# Patient Record
Sex: Female | Born: 1945
Health system: Midwestern US, Community
[De-identification: ages and names within clinical notes are randomized; demographics above are authoritative.]

## PROBLEM LIST (undated history)

## (undated) DIAGNOSIS — R112 Nausea with vomiting, unspecified: Secondary | ICD-10-CM

## (undated) DIAGNOSIS — I429 Cardiomyopathy, unspecified: Secondary | ICD-10-CM

## (undated) DIAGNOSIS — J189 Pneumonia, unspecified organism: Secondary | ICD-10-CM

## (undated) DIAGNOSIS — H3530 Unspecified macular degeneration: Secondary | ICD-10-CM

## (undated) DIAGNOSIS — C801 Malignant (primary) neoplasm, unspecified: Secondary | ICD-10-CM

## (undated) DIAGNOSIS — Z95810 Presence of automatic (implantable) cardiac defibrillator: Secondary | ICD-10-CM

## (undated) DIAGNOSIS — T8859XA Other complications of anesthesia, initial encounter: Secondary | ICD-10-CM

## (undated) DIAGNOSIS — I509 Heart failure, unspecified: Secondary | ICD-10-CM

## (undated) DIAGNOSIS — Z9889 Other specified postprocedural states: Secondary | ICD-10-CM

## (undated) DIAGNOSIS — T4145XA Adverse effect of unspecified anesthetic, initial encounter: Secondary | ICD-10-CM

## (undated) HISTORY — PX: TONSILLECTOMY: SUR1361

---
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
## 2015-08-26 HISTORY — PX: JOINT REPLACEMENT: SHX530

## 2018-06-11 ENCOUNTER — Ambulatory Visit
Admission: RE | Admit: 2018-06-11 | Discharge: 2018-06-11 | Disposition: A | Discharge status: Home / Self Care | Payer: Medicare PPO | Source: Ambulatory Visit | Attending: Orthopedic Surgery | Admitting: Orthopedic Surgery

## 2018-06-11 ENCOUNTER — Other Ambulatory Visit: Payer: Self-pay

## 2018-06-11 ENCOUNTER — Other Ambulatory Visit: Payer: Self-pay | Admitting: Orthopedic Surgery

## 2018-06-11 DIAGNOSIS — M879 Osteonecrosis, unspecified: Secondary | ICD-10-CM

## 2018-07-31 NOTE — Progress Notes (Signed)
Please place orders in Epic as patient is being scheduled for a pre-op appointment! Thank you! 

## 2018-08-02 NOTE — Progress Notes (Signed)
SPOKE W/PT     SCREENING SYMPTOMS OF COVID 19:   COUGH--NO  RUNNY NOSE--- NO  SORE THROAT---NO  NASAL CONGESTION----NO  SNEEZING----NO  SHORTNESS OF BREATH---NO  DIFFICULTY BREATHING---NO  TEMP >100.0 -----NO  UNEXPLAINED BODY ACHES------NO  CHILLS --------NO  HEADACHES ---------NO  LOSS OF SMELL/ TASTE --------NO    HAVE YOU OR ANY FAMILY MEMBER TRAVELLED PAST 14 DAYS OUT OF THE   COUNTY---NO STATE----NO COUNTRY----NO  HAVE YOU OR ANY FAMILY MEMBER BEEN EXPOSED TO ANYONE WITH COVID 19? NO

## 2018-08-02 NOTE — Patient Instructions (Signed)
Lisa Villanueva  08/02/2018   Your procedure is scheduled on: 08-08-18    Report to [facility] Main  Entrance    Report to Short Stay at 5:30 AM    Call this number if you have problems the morning of surgery [phone]    Remember: Do not eat food or drink liquids :After Midnight.    BRUSH YOUR TEETH MORNING OF SURGERY AND RINSE YOUR MOUTH OUT, NO CHEWING GUM CANDY OR MINTS.     Take these medicines the morning of surgery with A SIP OF WATER: Carvedilol (Coreg)                                  You may not have any metal on your body including hair pins and              piercings  Do not wear jewelry, make-up, lotions, powders or perfumes, deodorant             Do not wear nail polish.  Do not shave  48 hours prior to surgery.              Do not bring valuables to the hospital. [place].  Contacts, dentures or bridgework may not be worn into surgery.   Special Instructions: N/A              Please read over the following fact sheets you were given: _____________________________________________________________________             [facility] - Preparing for Surgery Before surgery, you can play an important role.  Because skin is not sterile, your skin needs to be as free of germs as possible.  You can reduce the number of germs on your skin by washing with CHG (chlorahexidine gluconate) soap before surgery.  CHG is an antiseptic cleaner which kills germs and bonds with the skin to continue killing germs even after washing. Please DO NOT use if you have an allergy to CHG or antibacterial soaps.  If your skin becomes reddened/irritated stop using the CHG and inform your nurse when you arrive at Short Stay. Do not shave (including legs and underarms) for at least 48 hours prior to the first CHG shower.  You may shave your face/neck. Please follow these instructions carefully:  1.  Shower with CHG  Soap the night before surgery and the  morning of Surgery.  2.  If you choose to wash your hair, wash your hair first as usual with your  normal  shampoo.  3.  After you shampoo, rinse your hair and body thoroughly to remove the  shampoo.                           4.  Use CHG as you would any other liquid soap.  You can apply chg directly  to the skin and wash                       Gently with a scrungie or clean washcloth.  5.  Apply the CHG Soap to your body ONLY FROM THE NECK DOWN.   Do not use on face/ open  Wound or open sores. Avoid contact with eyes, ears mouth and genitals (private parts).                       Wash face,  Genitals (private parts) with your normal soap.             6.  Wash thoroughly, paying special attention to the area where your surgery  will be performed.  7.  Thoroughly rinse your body with warm water from the neck down.  8.  DO NOT shower/wash with your normal soap after using and rinsing off  the CHG Soap.                9.  Pat yourself dry with a clean towel.            10.  Wear clean pajamas.            11.  Place clean sheets on your bed the night of your first shower and do not  sleep with pets. Day of Surgery : Do not apply any lotions/deodorants the morning of surgery.  Please wear clean clothes to the hospital/surgery center.  FAILURE TO FOLLOW THESE INSTRUCTIONS MAY RESULT IN THE CANCELLATION OF YOUR SURGERY PATIENT SIGNATURE_________________________________  NURSE SIGNATURE__________________________________  ________________________________________________________________________

## 2018-08-05 ENCOUNTER — Encounter (HOSPITAL_COMMUNITY): Payer: Self-pay

## 2018-08-05 ENCOUNTER — Encounter (HOSPITAL_COMMUNITY)
Admission: RE | Admit: 2018-08-05 | Discharge: 2018-08-05 | Disposition: A | Discharge status: Home / Self Care | Payer: Medicare PPO | Source: Ambulatory Visit | Attending: Orthopedic Surgery | Admitting: Orthopedic Surgery

## 2018-08-05 ENCOUNTER — Other Ambulatory Visit: Payer: Self-pay

## 2018-08-05 ENCOUNTER — Other Ambulatory Visit (HOSPITAL_COMMUNITY)
Admission: RE | Admit: 2018-08-05 | Discharge: 2018-08-05 | Disposition: A | Discharge status: Home / Self Care | Payer: Medicare PPO | Source: Ambulatory Visit | Attending: Orthopedic Surgery | Admitting: Orthopedic Surgery

## 2018-08-05 DIAGNOSIS — Z1159 Encounter for screening for other viral diseases: Secondary | ICD-10-CM | POA: Insufficient documentation

## 2018-08-05 DIAGNOSIS — Z01812 Encounter for preprocedural laboratory examination: Secondary | ICD-10-CM | POA: Insufficient documentation

## 2018-08-05 HISTORY — DX: Pneumonia, unspecified organism: J18.9

## 2018-08-05 HISTORY — DX: Cardiomyopathy, unspecified: I42.9

## 2018-08-05 HISTORY — DX: Malignant (primary) neoplasm, unspecified: C80.1

## 2018-08-05 HISTORY — DX: Presence of automatic (implantable) cardiac defibrillator: Z95.810

## 2018-08-05 HISTORY — DX: Other complications of anesthesia, initial encounter: T88.59XA

## 2018-08-05 HISTORY — DX: Other specified postprocedural states: R11.2

## 2018-08-05 HISTORY — DX: Unspecified macular degeneration: H35.30

## 2018-08-05 HISTORY — DX: Other specified postprocedural states: Z98.890

## 2018-08-05 HISTORY — DX: Heart failure, unspecified: I50.9

## 2018-08-05 LAB — CBC
HCT: 43 % (ref 36.0–46.0)
Hemoglobin: 13.4 g/dL (ref 12.0–15.0)
MCH: 29.5 pg (ref 26.0–34.0)
MCHC: 31.2 g/dL (ref 30.0–36.0)
MCV: 94.5 fL (ref 80.0–100.0)
Platelets: 358 10*3/uL (ref 150–400)
RBC: 4.55 MIL/uL (ref 3.87–5.11)
RDW: 15.1 % (ref 11.5–15.5)
WBC: 10.1 10*3/uL (ref 4.0–10.5)
nRBC: 0 % (ref 0.0–0.2)

## 2018-08-05 LAB — BASIC METABOLIC PANEL
Anion gap: 11 (ref 5–15)
BUN: 18 mg/dL (ref 8–23)
CO2: 27 mmol/L (ref 22–32)
Calcium: 10.2 mg/dL (ref 8.9–10.3)
Chloride: 105 mmol/L (ref 98–111)
Creatinine, Ser: 1.14 mg/dL — ABNORMAL HIGH (ref 0.44–1.00)
GFR calc Af Amer: 56 mL/min — ABNORMAL LOW (ref >60)
GFR calc non Af Amer: 49 mL/min — ABNORMAL LOW (ref >60)
Glucose, Bld: 113 mg/dL — ABNORMAL HIGH (ref 70–99)
Potassium: 4.9 mmol/L (ref 3.5–5.1)
Sodium: 143 mmol/L (ref 135–145)

## 2018-08-05 LAB — SURGICAL PCR SCREEN
MRSA, PCR: NEGATIVE
Staphylococcus aureus: NEGATIVE

## 2018-08-06 ENCOUNTER — Ambulatory Visit: Payer: Self-pay | Admitting: Orthopedic Surgery

## 2018-08-06 LAB — NOVEL CORONAVIRUS, NAA (HOSP ORDER, SEND-OUT TO REF LAB; TAT 18-24 HRS): SARS-CoV-2, NAA: NOT DETECTED

## 2018-08-06 NOTE — Progress Notes (Signed)
Anesthesia Chart Review   Case:  [id number] Date/Time:  08/08/18 0715   Procedure:  TOTAL HIP ARTHROPLASTY ANTERIOR APPROACH (Right )   Anesthesia type:  Spinal   Pre-op diagnosis:  right hip osteoarthritis   Location:  [facility] ROOM 10 / [facility]   Surgeon:  Rod Can, MD      DISCUSSION:72 yo never smoker with h/o PONV, HLD, CHF, AICD (device orders on chart), metastatic breast cancer, right hip OA scheduled for above procedure 08/08/18 with Dr. Rod Can.    Pt last seen by cardiology 06/13/2018.  Seen by Dr. Domingo Sep.  Per OV note (on chart), "She has an RCRI risk score of 1 which correlates with a 0.9% risk of major cardiac event.  She denies history of stroke, renal disease, diabetes or ischemic heart disease.  She does have a history of cardiomyopathy that normalized after biventricular device implant.  She is not able to tolerated greater than 4 METS of activity secondary to limitations from her hip however did have a nuclear stress test within the last year that she reports was negative.  Believe she is at acceptable risk to proceed with surgery.  Would note perform any other cardiovascular testing proi to proceedings."   Pt can proceed with planned procedure barring acute status change.  VS: BP 122/70   Pulse 88   Temp 37 C (Oral)   Resp 18   Ht 5\' 6"  (1.676 m)   Wt 74 kg   SpO2 100%   BMI 26.33 kg/m   PROVIDERS: Erven Colla, DO is PCP  Callie Fielding, MD is Radiation Ronn Melena, MD is Cardiologist  LABS: Labs reviewed: Acceptable for surgery. (all labs ordered are listed, but only abnormal results are displayed)  Labs Reviewed  BASIC METABOLIC PANEL - Abnormal; Notable for the following components:      Result Value   Glucose, Bld 113 (*)    Creatinine, Ser 1.14 (*)    GFR calc non Af Amer 49 (*)    GFR calc Af Amer 56 (*)    All other components within normal limits  SURGICAL PCR SCREEN  CBC     IMAGES:   EKG: 12/14/18 Electronic ventricular  pacemaker  CV:  Past Medical History:  Diagnosis Date  . AICD (automatic cardioverter/defibrillator) present   . Cancer ([place])    Metastic Breast Cancer  . Cardiomyopathy ([place])   . CHF (congestive heart failure) ([place])   . Complication of anesthesia   . Macular degeneration   . Pneumonia   . PONV (postoperative nausea and vomiting)     Past Surgical History:  Procedure Laterality Date  . JOINT REPLACEMENT Left 08/2015   Hip  . TONSILLECTOMY      MEDICATIONS: . anastrozole (ARIMIDEX) 1 MG tablet  . aspirin EC 81 MG tablet  . carvedilol (COREG) 12.5 MG tablet  . Cholecalciferol (VITAMIN D3) 125 MCG (5000 UT) CAPS  . Coenzyme Q10 (COQ-10) 200 MG CAPS  . diphenhydramine-acetaminophen (TYLENOL PM) 25-500 MG TABS tablet  . furosemide (LASIX) 20 MG tablet  . Multiple Vitamins-Minerals (PRESERVISION AREDS 2) CAPS  . Omega-3 Fatty Acids (FISH OIL) 1200 MG CAPS  . Polyvinyl Alcohol-Povidone (REFRESH OP)  . spironolactone (ALDACTONE) 25 MG tablet  . vitamin C (ASCORBIC ACID) 500 MG tablet   No current facility-administered medications for this encounter.      Maia Plan [facility] Pre-Surgical Testing [phone] 08/07/18 12:49 PM

## 2018-08-06 NOTE — H&P (Signed)
TOTAL HIP ADMISSION H&P  Patient is admitted for right total hip arthroplasty.  Subjective:  Chief Complaint: right hip pain  HPI: Lisa Villanueva, 73 y.o. 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  Onset of symptoms was abrupt starting 2 years ago with rapidlly worsening course since that time.The patient noted AVN on the right hip(s).  Patient currently rates pain in the right hip at 10 out of 10 with activity. Patient has night pain, trendelenberg gait, pain that interfers with activities of daily living, pain with passive range of motion and crepitus. Patient has evidence of subchondral cysts, subchondral sclerosis, periarticular osteophytes, joint subluxation and joint space narrowing by imaging studies. This condition presents safety issues increasing the risk of falls There is no current active infection.  There are no active problems to display for this patient.  Past Medical History:  Diagnosis Date  . AICD (automatic cardioverter/defibrillator) present   . Cancer ([place])    Metastic Breast Cancer  . Cardiomyopathy ([place])   . CHF (congestive heart failure) ([place])   . Complication of anesthesia   . Macular degeneration   . Pneumonia   . PONV (postoperative nausea and vomiting)     Past Surgical History:  Procedure Laterality Date  . JOINT REPLACEMENT Left 08/2015   Hip  . TONSILLECTOMY      Current Outpatient Medications  Medication Sig Dispense Refill Last Dose  . anastrozole (ARIMIDEX) 1 MG tablet Take 1 mg by mouth daily.     Marland Kitchen aspirin EC 81 MG tablet Take 81 mg by mouth daily.     . carvedilol (COREG) 12.5 MG tablet Take 12.5 mg by mouth 2 (two) times a day.     .  Cholecalciferol (VITAMIN D3) 125 MCG (5000 UT) CAPS Take 5,000 Units by mouth daily.     . Coenzyme Q10 (COQ-10) 200 MG CAPS Take 200 mg by mouth daily.     . diphenhydramine-acetaminophen (TYLENOL PM) 25-500 MG TABS tablet Take 2 tablets by mouth at bedtime.     . furosemide (LASIX) 20 MG tablet Take 20 mg by mouth daily.     . Multiple Vitamins-Minerals (PRESERVISION AREDS 2) CAPS Take 1 capsule by mouth 2 (two) times a day.     . Omega-3 Fatty Acids (FISH OIL) 1200 MG CAPS Take 1,200 mg by mouth daily.     . Polyvinyl Alcohol-Povidone (REFRESH OP) Place 1 drop into both eyes 2 (two) times a day.     . spironolactone (ALDACTONE) 25 MG tablet Take 25 mg by mouth daily.     . vitamin C (ASCORBIC ACID) 500 MG tablet Take 500 mg by mouth daily.      No current facility-administered medications for this visit.    No Known Allergies  Social History   Tobacco Use  . Smoking status: Never Smoker  . Smokeless tobacco: Never Used  Substance Use Topics  . Alcohol use: Not Currently    No family history on file.   Review of Systems  Constitutional: Negative.   HENT: Negative.   Eyes: Negative.   Respiratory: Negative.   Cardiovascular: Negative.   Gastrointestinal: Negative.   Genitourinary: Negative.   Musculoskeletal: Positive for back pain and joint pain.  Skin: Negative.   Neurological: Negative.  Endo/Heme/Allergies: Negative.   Psychiatric/Behavioral: Negative.     Objective:  Physical Exam  Vitals reviewed. Constitutional: She is oriented to person, place, and time. She appears well-developed and well-nourished.  HENT:  Head: Normocephalic and atraumatic.  Eyes: Pupils are equal, round, and reactive to light. Conjunctivae and EOM are normal.  Neck: Normal range of motion. Neck supple.  Cardiovascular: Normal rate, regular rhythm and intact distal pulses.  Respiratory: Effort normal. No respiratory distress.  GI: Soft. She exhibits no distension.  Genitourinary:     Genitourinary Comments: deferred   Musculoskeletal:     Right shoulder: She exhibits decreased range of motion and bony tenderness.  Neurological: She is alert and oriented to person, place, and time. She has normal reflexes.  Skin: Skin is warm and dry.  Psychiatric: She has a normal mood and affect. Her behavior is normal. Judgment and thought content normal.    Vital signs in last 24 hours: @VSRANGES @  Labs:   Estimated body mass index is 26.33 kg/m as calculated from the following:   Height as of 08/05/18: 5\' 6"  (1.676 m).   Weight as of 08/05/18: 74 kg.   Imaging Review Plain radiographs demonstrate severe degenerative joint disease of the right hip(s). The bone quality appears to be adequate for age and reported activity level.      Assessment/Plan:  End stage arthritis, right hip(s)  The patient history, physical examination, clinical judgement of the provider and imaging studies are consistent with end stage degenerative joint disease of the right hip(s) and total hip arthroplasty is deemed medically necessary. The treatment options including medical management, injection therapy, arthroscopy and arthroplasty were discussed at length. The risks and benefits of total hip arthroplasty were presented and reviewed. The risks due to aseptic loosening, infection, stiffness, dislocation/subluxation,  thromboembolic complications and other imponderables were discussed.  The patient acknowledged the explanation, agreed to proceed with the plan and consent was signed. Patient is being admitted for inpatient treatment for surgery, pain control, PT, OT, prophylactic antibiotics, VTE prophylaxis, progressive ambulation and ADL's and discharge planning.The patient is planning to be discharged home with HEP   Anticipated LOS equal to or greater than 2 midnights due to - Age 7 and older with one or more of the following:  - Obesity  - Expected need for hospital services (PT, OT, Nursing)  required for safe  discharge  - Anticipated need for postoperative skilled nursing care or inpatient rehab  - Active co-morbidities: None OR   - Unanticipated findings during/Post Surgery: None  - Patient is a high risk of re-admission due to: None

## 2018-08-06 NOTE — H&P (View-Only) (Signed)
TOTAL HIP ADMISSION H&P  Patient is admitted for right total hip arthroplasty.  Subjective:  Chief Complaint: right hip pain  HPI: Lisa Villanueva, 73 y.o. 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  Onset of symptoms was abrupt starting 2 years ago with rapidlly worsening course since that time.The patient noted AVN on the right hip(s).  Patient currently rates pain in the right hip at 10 out of 10 with activity. Patient has night pain, trendelenberg gait, pain that interfers with activities of daily living, pain with passive range of motion and crepitus. Patient has evidence of subchondral cysts, subchondral sclerosis, periarticular osteophytes, joint subluxation and joint space narrowing by imaging studies. This condition presents safety issues increasing the risk of falls There is no current active infection.  There are no active problems to display for this patient.  Past Medical History:  Diagnosis Date  . AICD (automatic cardioverter/defibrillator) present   . Cancer ([place])    Metastic Breast Cancer  . Cardiomyopathy ([place])   . CHF (congestive heart failure) ([place])   . Complication of anesthesia   . Macular degeneration   . Pneumonia   . PONV (postoperative nausea and vomiting)     Past Surgical History:  Procedure Laterality Date  . JOINT REPLACEMENT Left 08/2015   Hip  . TONSILLECTOMY      Current Outpatient Medications  Medication Sig Dispense Refill Last Dose  . anastrozole (ARIMIDEX) 1 MG tablet Take 1 mg by mouth daily.     Marland Kitchen aspirin EC 81 MG tablet Take 81 mg by mouth daily.     . carvedilol (COREG) 12.5 MG tablet Take 12.5 mg by mouth 2 (two) times a day.     .  Cholecalciferol (VITAMIN D3) 125 MCG (5000 UT) CAPS Take 5,000 Units by mouth daily.     . Coenzyme Q10 (COQ-10) 200 MG CAPS Take 200 mg by mouth daily.     . diphenhydramine-acetaminophen (TYLENOL PM) 25-500 MG TABS tablet Take 2 tablets by mouth at bedtime.     . furosemide (LASIX) 20 MG tablet Take 20 mg by mouth daily.     . Multiple Vitamins-Minerals (PRESERVISION AREDS 2) CAPS Take 1 capsule by mouth 2 (two) times a day.     . Omega-3 Fatty Acids (FISH OIL) 1200 MG CAPS Take 1,200 mg by mouth daily.     . Polyvinyl Alcohol-Povidone (REFRESH OP) Place 1 drop into both eyes 2 (two) times a day.     . spironolactone (ALDACTONE) 25 MG tablet Take 25 mg by mouth daily.     . vitamin C (ASCORBIC ACID) 500 MG tablet Take 500 mg by mouth daily.      No current facility-administered medications for this visit.    No Known Allergies  Social History   Tobacco Use  . Smoking status: Never Smoker  . Smokeless tobacco: Never Used  Substance Use Topics  . Alcohol use: Not Currently    No family history on file.   Review of Systems  Constitutional: Negative.   HENT: Negative.   Eyes: Negative.   Respiratory: Negative.   Cardiovascular: Negative.   Gastrointestinal: Negative.   Genitourinary: Negative.   Musculoskeletal: Positive for back pain and joint pain.  Skin: Negative.   Neurological: Negative.  Endo/Heme/Allergies: Negative.   Psychiatric/Behavioral: Negative.     Objective:  Physical Exam  Vitals reviewed. Constitutional: She is oriented to person, place, and time. She appears well-developed and well-nourished.  HENT:  Head: Normocephalic and atraumatic.  Eyes: Pupils are equal, round, and reactive to light. Conjunctivae and EOM are normal.  Neck: Normal range of motion. Neck supple.  Cardiovascular: Normal rate, regular rhythm and intact distal pulses.  Respiratory: Effort normal. No respiratory distress.  GI: Soft. She exhibits no distension.  Genitourinary:     Genitourinary Comments: deferred   Musculoskeletal:     Right shoulder: She exhibits decreased range of motion and bony tenderness.  Neurological: She is alert and oriented to person, place, and time. She has normal reflexes.  Skin: Skin is warm and dry.  Psychiatric: She has a normal mood and affect. Her behavior is normal. Judgment and thought content normal.    Vital signs in last 24 hours: @VSRANGES @  Labs:   Estimated body mass index is 26.33 kg/m as calculated from the following:   Height as of 08/05/18: 5\' 6"  (1.676 m).   Weight as of 08/05/18: 74 kg.   Imaging Review Plain radiographs demonstrate severe degenerative joint disease of the right hip(s). The bone quality appears to be adequate for age and reported activity level.      Assessment/Plan:  End stage arthritis, right hip(s)  The patient history, physical examination, clinical judgement of the provider and imaging studies are consistent with end stage degenerative joint disease of the right hip(s) and total hip arthroplasty is deemed medically necessary. The treatment options including medical management, injection therapy, arthroscopy and arthroplasty were discussed at length. The risks and benefits of total hip arthroplasty were presented and reviewed. The risks due to aseptic loosening, infection, stiffness, dislocation/subluxation,  thromboembolic complications and other imponderables were discussed.  The patient acknowledged the explanation, agreed to proceed with the plan and consent was signed. Patient is being admitted for inpatient treatment for surgery, pain control, PT, OT, prophylactic antibiotics, VTE prophylaxis, progressive ambulation and ADL's and discharge planning.The patient is planning to be discharged home with HEP   Anticipated LOS equal to or greater than 2 midnights due to - Age 33 and older with one or more of the following:  - Obesity  - Expected need for hospital services (PT, OT, Nursing)  required for safe  discharge  - Anticipated need for postoperative skilled nursing care or inpatient rehab  - Active co-morbidities: None OR   - Unanticipated findings during/Post Surgery: None  - Patient is a high risk of re-admission due to: None

## 2018-08-07 NOTE — Anesthesia Preprocedure Evaluation (Addendum)
Anesthesia Evaluation    Reviewed: Allergy & Precautions, H&P , Patient's Chart, lab work & pertinent test results  History of Anesthesia Complications (+) PONV and history of anesthetic complications  Airway Mallampati: II  TM Distance: >3 FB Neck ROM: Full    Dental no notable dental hx.    Pulmonary neg pulmonary ROS,    Pulmonary exam normal breath sounds clear to auscultation       Cardiovascular Exercise Tolerance: Good +CHF  Normal cardiovascular exam+ Cardiac Defibrillator  Rhythm:Regular Rate:Normal  History of cardiomyopathy that normalized after biventricular device implant.  She is not able to tolerated greater than 4 METS of activity secondary to limitations from her hip however did have a nuclear stress test within the last year that she reports was negative   Neuro/Psych negative neurological ROS  negative psych ROS   GI/Hepatic negative GI ROS, Neg liver ROS,   Endo/Other  negative endocrine ROS  Renal/GU negative Renal ROS  negative genitourinary   Musculoskeletal   Abdominal   Peds  Hematology negative hematology ROS (+)   Anesthesia Other Findings   Reproductive/Obstetrics negative OB ROS                             Anesthesia Physical Anesthesia Plan  ASA: III  Anesthesia Plan: Spinal   Post-op Pain Management:    Induction:   PONV Risk Score and Plan: 2 and Treatment may vary due to age or medical condition, Ondansetron, Dexamethasone and Midazolam  Airway Management Planned: Nasal Cannula  Additional Equipment:   Intra-op Plan:   Post-operative Plan:   Informed Consent: I have reviewed the patients History and Physical, chart, labs and discussed the procedure including the risks, benefits and alternatives for the proposed anesthesia with the patient or authorized representative who has indicated his/her understanding and acceptance.       Plan  Discussed with: CRNA, Anesthesiologist and Surgeon  Anesthesia Plan Comments: (See PAT note 08/05/18, Konrad Felix, PA-C)       Anesthesia Quick Evaluation

## 2018-08-08 ENCOUNTER — Inpatient Hospital Stay (HOSPITAL_COMMUNITY): Payer: Medicare PPO | Admitting: Certified Registered"

## 2018-08-08 ENCOUNTER — Encounter (HOSPITAL_COMMUNITY): Payer: Self-pay

## 2018-08-08 ENCOUNTER — Other Ambulatory Visit: Payer: Self-pay

## 2018-08-08 ENCOUNTER — Inpatient Hospital Stay (HOSPITAL_COMMUNITY): Payer: Medicare PPO

## 2018-08-08 ENCOUNTER — Encounter (HOSPITAL_COMMUNITY)
Admission: RE | Disposition: A | Discharge status: Home / Self Care | Payer: Self-pay | Source: Home / Self Care | Attending: Orthopedic Surgery

## 2018-08-08 ENCOUNTER — Inpatient Hospital Stay (HOSPITAL_COMMUNITY)
Admission: RE | Admit: 2018-08-08 | Discharge: 2018-08-09 | DRG: 470 | Disposition: A | Discharge status: Home / Self Care | Payer: Medicare PPO | Attending: Orthopedic Surgery | Admitting: Orthopedic Surgery

## 2018-08-08 ENCOUNTER — Inpatient Hospital Stay (HOSPITAL_COMMUNITY): Payer: Medicare PPO | Admitting: Physician Assistant

## 2018-08-08 DIAGNOSIS — C7951 Secondary malignant neoplasm of bone: Secondary | ICD-10-CM | POA: Diagnosis present

## 2018-08-08 DIAGNOSIS — Z95810 Presence of automatic (implantable) cardiac defibrillator: Secondary | ICD-10-CM | POA: Diagnosis not present

## 2018-08-08 DIAGNOSIS — Z853 Personal history of malignant neoplasm of breast: Secondary | ICD-10-CM

## 2018-08-08 DIAGNOSIS — Z8701 Personal history of pneumonia (recurrent): Secondary | ICD-10-CM | POA: Diagnosis not present

## 2018-08-08 DIAGNOSIS — Z79899 Other long term (current) drug therapy: Secondary | ICD-10-CM | POA: Diagnosis not present

## 2018-08-08 DIAGNOSIS — Z7982 Long term (current) use of aspirin: Secondary | ICD-10-CM

## 2018-08-08 DIAGNOSIS — I429 Cardiomyopathy, unspecified: Secondary | ICD-10-CM | POA: Diagnosis present

## 2018-08-08 DIAGNOSIS — Z79811 Long term (current) use of aromatase inhibitors: Secondary | ICD-10-CM

## 2018-08-08 DIAGNOSIS — M1611 Unilateral primary osteoarthritis, right hip: Secondary | ICD-10-CM | POA: Diagnosis present

## 2018-08-08 DIAGNOSIS — E869 Volume depletion, unspecified: Secondary | ICD-10-CM | POA: Diagnosis not present

## 2018-08-08 DIAGNOSIS — M879 Osteonecrosis, unspecified: Principal | ICD-10-CM | POA: Diagnosis present

## 2018-08-08 DIAGNOSIS — M87051 Idiopathic aseptic necrosis of right femur: Secondary | ICD-10-CM | POA: Diagnosis present

## 2018-08-08 DIAGNOSIS — I509 Heart failure, unspecified: Secondary | ICD-10-CM | POA: Diagnosis present

## 2018-08-08 DIAGNOSIS — Z9181 History of falling: Secondary | ICD-10-CM

## 2018-08-08 DIAGNOSIS — Z96642 Presence of left artificial hip joint: Secondary | ICD-10-CM | POA: Diagnosis present

## 2018-08-08 DIAGNOSIS — Z419 Encounter for procedure for purposes other than remedying health state, unspecified: Secondary | ICD-10-CM

## 2018-08-08 DIAGNOSIS — Z09 Encounter for follow-up examination after completed treatment for conditions other than malignant neoplasm: Secondary | ICD-10-CM

## 2018-08-08 DIAGNOSIS — Z1159 Encounter for screening for other viral diseases: Secondary | ICD-10-CM | POA: Diagnosis not present

## 2018-08-08 DIAGNOSIS — H3530 Unspecified macular degeneration: Secondary | ICD-10-CM | POA: Diagnosis present

## 2018-08-08 DIAGNOSIS — M87851 Other osteonecrosis, right femur: Secondary | ICD-10-CM | POA: Diagnosis present

## 2018-08-08 HISTORY — PX: TOTAL HIP ARTHROPLASTY: SHX124

## 2018-08-08 LAB — TYPE AND SCREEN
ABO/RH(D): A POS
Antibody Screen: NEGATIVE

## 2018-08-08 LAB — ABO/RH: ABO/RH(D): A POS

## 2018-08-08 SURGERY — ARTHROPLASTY, HIP, TOTAL, ANTERIOR APPROACH
Anesthesia: Spinal | Laterality: Right

## 2018-08-08 MED ORDER — ISOPROPYL ALCOHOL 70 % SOLN
Status: DC | PRN
  Administered 2018-08-08: 1 via TOPICAL

## 2018-08-08 MED ORDER — SCOPOLAMINE 1 MG/3DAYS TD PT72
1.0000 | MEDICATED_PATCH | TRANSDERMAL | Status: DC
  Administered 2018-08-08: 1.5 mg via TRANSDERMAL
  Filled 2018-08-08: qty 1

## 2018-08-08 MED ORDER — PHENYLEPHRINE 40 MCG/ML (10ML) SYRINGE FOR IV PUSH (FOR BLOOD PRESSURE SUPPORT)
PREFILLED_SYRINGE | INTRAVENOUS | Status: AC
  Filled 2018-08-08: qty 10

## 2018-08-08 MED ORDER — SUGAMMADEX SODIUM 200 MG/2ML IV SOLN
INTRAVENOUS | Status: DC | PRN
  Administered 2018-08-08: 150 mg via INTRAVENOUS

## 2018-08-08 MED ORDER — LIDOCAINE 2% (20 MG/ML) 5 ML SYRINGE
INTRAMUSCULAR | Status: DC | PRN
  Administered 2018-08-08: 80 mg via INTRAVENOUS

## 2018-08-08 MED ORDER — VITAMIN C 500 MG PO TABS
500.0000 mg | ORAL_TABLET | Freq: Every day | ORAL | Status: DC
  Administered 2018-08-08 – 2018-08-09 (×2): 500 mg via ORAL
  Filled 2018-08-08 (×2): qty 1

## 2018-08-08 MED ORDER — DOCUSATE SODIUM 100 MG PO CAPS
100.0000 mg | ORAL_CAPSULE | Freq: Two times a day (BID) | ORAL | Status: DC
  Administered 2018-08-08 – 2018-08-09 (×2): 100 mg via ORAL
  Filled 2018-08-08 (×3): qty 1

## 2018-08-08 MED ORDER — KETOROLAC TROMETHAMINE 30 MG/ML IJ SOLN
INTRAMUSCULAR | Status: AC
  Filled 2018-08-08: qty 1

## 2018-08-08 MED ORDER — FENTANYL CITRATE (PF) 100 MCG/2ML IJ SOLN
INTRAMUSCULAR | Status: AC
  Filled 2018-08-08: qty 2

## 2018-08-08 MED ORDER — ONDANSETRON HCL 4 MG/2ML IJ SOLN
4.0000 mg | Freq: Once | INTRAMUSCULAR | Status: AC | PRN
  Administered 2018-08-08: 4 mg via INTRAVENOUS

## 2018-08-08 MED ORDER — CHLORHEXIDINE GLUCONATE 4 % EX LIQD: 60.0000 mL | Freq: Once | CUTANEOUS | Status: DC

## 2018-08-08 MED ORDER — KETOROLAC TROMETHAMINE 30 MG/ML IJ SOLN
INTRAMUSCULAR | Status: DC | PRN
  Administered 2018-08-08: 30 mg via INTRA_ARTICULAR

## 2018-08-08 MED ORDER — FENTANYL CITRATE (PF) 100 MCG/2ML IJ SOLN
25.0000 ug | INTRAMUSCULAR | Status: DC | PRN
  Administered 2018-08-08 (×2): 50 ug via INTRAVENOUS

## 2018-08-08 MED ORDER — PROPOFOL 500 MG/50ML IV EMUL
INTRAVENOUS | Status: DC | PRN
  Administered 2018-08-08: 25 ug/kg/min via INTRAVENOUS

## 2018-08-08 MED ORDER — SODIUM CHLORIDE 0.9 % IR SOLN
Status: DC | PRN
  Administered 2018-08-08: 4000 mL

## 2018-08-08 MED ORDER — METHOCARBAMOL 500 MG IVPB - SIMPLE MED
500.0000 mg | Freq: Four times a day (QID) | INTRAVENOUS | Status: DC | PRN
  Administered 2018-08-08: 12:00:00 500 mg via INTRAVENOUS
  Filled 2018-08-08: qty 50

## 2018-08-08 MED ORDER — HYDROCODONE-ACETAMINOPHEN 7.5-325 MG PO TABS: 1.0000 | ORAL_TABLET | ORAL | Status: DC | PRN

## 2018-08-08 MED ORDER — DEXAMETHASONE SODIUM PHOSPHATE 10 MG/ML IJ SOLN
10.0000 mg | Freq: Once | INTRAMUSCULAR | Status: AC
  Administered 2018-08-09: 09:00:00 10 mg via INTRAVENOUS
  Filled 2018-08-08 (×2): qty 1

## 2018-08-08 MED ORDER — LACTATED RINGERS IV SOLN
INTRAVENOUS | Status: DC
  Administered 2018-08-08: 07:00:00 via INTRAVENOUS

## 2018-08-08 MED ORDER — DEXAMETHASONE SODIUM PHOSPHATE 10 MG/ML IJ SOLN
INTRAMUSCULAR | Status: AC
  Filled 2018-08-08: qty 1

## 2018-08-08 MED ORDER — CEFAZOLIN SODIUM-DEXTROSE 2-4 GM/100ML-% IV SOLN
2.0000 g | INTRAVENOUS | Status: AC
  Administered 2018-08-08: 2 g via INTRAVENOUS
  Filled 2018-08-08: qty 100

## 2018-08-08 MED ORDER — ACETAMINOPHEN 160 MG/5ML PO SOLN: 325.0000 mg | ORAL | Status: DC | PRN

## 2018-08-08 MED ORDER — SUGAMMADEX SODIUM 200 MG/2ML IV SOLN
INTRAVENOUS | Status: AC
  Filled 2018-08-08: qty 2

## 2018-08-08 MED ORDER — ROCURONIUM BROMIDE 10 MG/ML (PF) SYRINGE
PREFILLED_SYRINGE | INTRAVENOUS | Status: AC
  Filled 2018-08-08: qty 20

## 2018-08-08 MED ORDER — POVIDONE-IODINE 10 % EX SWAB
2.0000 "application " | Freq: Once | CUTANEOUS | Status: AC
  Administered 2018-08-08: 2 via TOPICAL

## 2018-08-08 MED ORDER — MEPERIDINE HCL 50 MG/ML IJ SOLN: 6.2500 mg | INTRAMUSCULAR | Status: DC | PRN

## 2018-08-08 MED ORDER — POVIDONE-IODINE 10 % EX SWAB: 2.0000 "application " | Freq: Once | CUTANEOUS | Status: DC

## 2018-08-08 MED ORDER — SODIUM CHLORIDE 0.9 % IV SOLN: INTRAVENOUS | Status: DC

## 2018-08-08 MED ORDER — MIDAZOLAM HCL 2 MG/2ML IJ SOLN
INTRAMUSCULAR | Status: AC
  Filled 2018-08-08: qty 2

## 2018-08-08 MED ORDER — FENTANYL CITRATE (PF) 100 MCG/2ML IJ SOLN
INTRAMUSCULAR | Status: DC | PRN
  Administered 2018-08-08 (×3): 25 ug via INTRAVENOUS
  Administered 2018-08-08 (×2): 50 ug via INTRAVENOUS
  Administered 2018-08-08: 25 ug via INTRAVENOUS

## 2018-08-08 MED ORDER — ISOPROPYL ALCOHOL 70 % SOLN
Status: AC
  Filled 2018-08-08: qty 480

## 2018-08-08 MED ORDER — DEXAMETHASONE SODIUM PHOSPHATE 10 MG/ML IJ SOLN
INTRAMUSCULAR | Status: DC | PRN
  Administered 2018-08-08: 6 mg via INTRAVENOUS

## 2018-08-08 MED ORDER — SENNA 8.6 MG PO TABS
1.0000 | ORAL_TABLET | Freq: Two times a day (BID) | ORAL | Status: DC
  Administered 2018-08-08 – 2018-08-09 (×2): 8.6 mg via ORAL
  Filled 2018-08-08 (×2): qty 1

## 2018-08-08 MED ORDER — METOCLOPRAMIDE HCL 5 MG/ML IJ SOLN
5.0000 mg | Freq: Three times a day (TID) | INTRAMUSCULAR | Status: DC | PRN
  Administered 2018-08-08: 10 mg via INTRAVENOUS

## 2018-08-08 MED ORDER — ALUM & MAG HYDROXIDE-SIMETH 200-200-20 MG/5ML PO SUSP: 30.0000 mL | ORAL | Status: DC | PRN

## 2018-08-08 MED ORDER — PROMETHAZINE HCL 25 MG/ML IJ SOLN: 12.5000 mg | Freq: Four times a day (QID) | INTRAMUSCULAR | Status: DC | PRN

## 2018-08-08 MED ORDER — FUROSEMIDE 20 MG PO TABS
20.0000 mg | ORAL_TABLET | Freq: Every day | ORAL | Status: DC
  Administered 2018-08-08 – 2018-08-09 (×2): 20 mg via ORAL
  Filled 2018-08-08 (×2): qty 1

## 2018-08-08 MED ORDER — KETOROLAC TROMETHAMINE 15 MG/ML IJ SOLN
7.5000 mg | Freq: Four times a day (QID) | INTRAMUSCULAR | Status: AC
  Administered 2018-08-08 – 2018-08-09 (×4): 7.5 mg via INTRAVENOUS
  Filled 2018-08-08 (×3): qty 1

## 2018-08-08 MED ORDER — MENTHOL 3 MG MT LOZG: 1.0000 | LOZENGE | OROMUCOSAL | Status: DC | PRN

## 2018-08-08 MED ORDER — SODIUM CHLORIDE (PF) 0.9 % IJ SOLN
INTRAMUSCULAR | Status: AC
  Filled 2018-08-08: qty 50

## 2018-08-08 MED ORDER — SPIRONOLACTONE 25 MG PO TABS
25.0000 mg | ORAL_TABLET | Freq: Every day | ORAL | Status: DC
  Filled 2018-08-08: qty 1

## 2018-08-08 MED ORDER — ONDANSETRON HCL 4 MG PO TABS: 4.0000 mg | ORAL_TABLET | Freq: Four times a day (QID) | ORAL | Status: DC | PRN

## 2018-08-08 MED ORDER — ONDANSETRON HCL 4 MG PO TABS
4.0000 mg | ORAL_TABLET | Freq: Four times a day (QID) | ORAL | Status: DC | PRN
  Filled 2018-08-08: qty 1

## 2018-08-08 MED ORDER — BUPIVACAINE-EPINEPHRINE (PF) 0.25% -1:200000 IJ SOLN
INTRAMUSCULAR | Status: AC
  Filled 2018-08-08: qty 30

## 2018-08-08 MED ORDER — PROPOFOL 10 MG/ML IV BOLUS
INTRAVENOUS | Status: DC | PRN
  Administered 2018-08-08: 140 mg via INTRAVENOUS

## 2018-08-08 MED ORDER — ONDANSETRON HCL 4 MG/2ML IJ SOLN
INTRAMUSCULAR | Status: DC | PRN
  Administered 2018-08-08: 4 mg via INTRAVENOUS

## 2018-08-08 MED ORDER — PROSIGHT PO TABS
1.0000 | ORAL_TABLET | Freq: Two times a day (BID) | ORAL | Status: DC
  Administered 2018-08-08 – 2018-08-09 (×2): 1 via ORAL
  Filled 2018-08-08 (×2): qty 1

## 2018-08-08 MED ORDER — SODIUM CHLORIDE 0.9 % IV SOLN
INTRAVENOUS | Status: DC
  Administered 2018-08-08 – 2018-08-09 (×4): via INTRAVENOUS

## 2018-08-08 MED ORDER — PHENOL 1.4 % MT LIQD: 1.0000 | OROMUCOSAL | Status: DC | PRN

## 2018-08-08 MED ORDER — DIPHENHYDRAMINE HCL 12.5 MG/5ML PO ELIX: 12.5000 mg | ORAL_SOLUTION | ORAL | Status: DC | PRN

## 2018-08-08 MED ORDER — ONDANSETRON HCL 4 MG/2ML IJ SOLN: 4.0000 mg | Freq: Four times a day (QID) | INTRAMUSCULAR | Status: DC | PRN

## 2018-08-08 MED ORDER — PROPOFOL 10 MG/ML IV BOLUS
INTRAVENOUS | Status: AC
  Filled 2018-08-08: qty 60

## 2018-08-08 MED ORDER — ACETAMINOPHEN 325 MG PO TABS: 325.0000 mg | ORAL_TABLET | Freq: Four times a day (QID) | ORAL | Status: DC | PRN

## 2018-08-08 MED ORDER — ROCURONIUM BROMIDE 10 MG/ML (PF) SYRINGE
PREFILLED_SYRINGE | INTRAVENOUS | Status: DC | PRN
  Administered 2018-08-08: 10 mg via INTRAVENOUS
  Administered 2018-08-08: 100 mg via INTRAVENOUS

## 2018-08-08 MED ORDER — METOCLOPRAMIDE HCL 5 MG/ML IJ SOLN
INTRAMUSCULAR | Status: AC
  Filled 2018-08-08: qty 2

## 2018-08-08 MED ORDER — METHOCARBAMOL 500 MG IVPB - SIMPLE MED
INTRAVENOUS | Status: AC
  Filled 2018-08-08: qty 50

## 2018-08-08 MED ORDER — MORPHINE SULFATE (PF) 2 MG/ML IV SOLN: 0.5000 mg | INTRAVENOUS | Status: DC | PRN

## 2018-08-08 MED ORDER — HYDROCODONE-ACETAMINOPHEN 5-325 MG PO TABS
1.0000 | ORAL_TABLET | ORAL | Status: DC | PRN
  Administered 2018-08-08 – 2018-08-09 (×2): 1 via ORAL
  Filled 2018-08-08: qty 1
  Filled 2018-08-08: qty 2
  Filled 2018-08-08: qty 1

## 2018-08-08 MED ORDER — CARVEDILOL 12.5 MG PO TABS
12.5000 mg | ORAL_TABLET | Freq: Two times a day (BID) | ORAL | Status: DC
  Administered 2018-08-08 (×2): 12.5 mg via ORAL
  Filled 2018-08-08 (×3): qty 1

## 2018-08-08 MED ORDER — CEFAZOLIN SODIUM-DEXTROSE 2-4 GM/100ML-% IV SOLN
2.0000 g | Freq: Four times a day (QID) | INTRAVENOUS | Status: AC
  Administered 2018-08-08 (×2): 2 g via INTRAVENOUS
  Filled 2018-08-08 (×2): qty 100

## 2018-08-08 MED ORDER — BUPIVACAINE-EPINEPHRINE 0.25% -1:200000 IJ SOLN
INTRAMUSCULAR | Status: DC | PRN
  Administered 2018-08-08: 30 mL

## 2018-08-08 MED ORDER — LIDOCAINE 2% (20 MG/ML) 5 ML SYRINGE
INTRAMUSCULAR | Status: AC
  Filled 2018-08-08: qty 10

## 2018-08-08 MED ORDER — POLYETHYLENE GLYCOL 3350 17 G PO PACK: 17.0000 g | PACK | Freq: Every day | ORAL | Status: DC | PRN

## 2018-08-08 MED ORDER — SODIUM CHLORIDE (PF) 0.9 % IJ SOLN
INTRAMUSCULAR | Status: DC | PRN
  Administered 2018-08-08: 30 mL

## 2018-08-08 MED ORDER — METHOCARBAMOL 500 MG PO TABS
500.0000 mg | ORAL_TABLET | Freq: Four times a day (QID) | ORAL | Status: DC | PRN
  Administered 2018-08-09: 02:00:00 500 mg via ORAL
  Filled 2018-08-08: qty 1

## 2018-08-08 MED ORDER — OXYCODONE HCL 5 MG/5ML PO SOLN: 5.0000 mg | Freq: Once | ORAL | Status: DC | PRN

## 2018-08-08 MED ORDER — ACETAMINOPHEN 325 MG PO TABS: 325.0000 mg | ORAL_TABLET | ORAL | Status: DC | PRN

## 2018-08-08 MED ORDER — APIXABAN 2.5 MG PO TABS
2.5000 mg | ORAL_TABLET | Freq: Two times a day (BID) | ORAL | Status: DC
  Administered 2018-08-09: 09:00:00 2.5 mg via ORAL
  Filled 2018-08-08: qty 1

## 2018-08-08 MED ORDER — VITAMIN D 25 MCG (1000 UNIT) PO TABS
5000.0000 [IU] | ORAL_TABLET | Freq: Every day | ORAL | Status: DC
  Administered 2018-08-09: 09:00:00 5000 [IU] via ORAL
  Filled 2018-08-08: qty 5

## 2018-08-08 MED ORDER — PROMETHAZINE HCL 25 MG/ML IJ SOLN
12.5000 mg | Freq: Four times a day (QID) | INTRAMUSCULAR | Status: DC | PRN
  Administered 2018-08-08: 12.5 mg via INTRAVENOUS
  Filled 2018-08-08: qty 1

## 2018-08-08 MED ORDER — ONDANSETRON HCL 4 MG/2ML IJ SOLN
INTRAMUSCULAR | Status: AC
  Filled 2018-08-08: qty 2

## 2018-08-08 MED ORDER — ANASTROZOLE 1 MG PO TABS
1.0000 mg | ORAL_TABLET | Freq: Every day | ORAL | Status: DC
  Administered 2018-08-08 – 2018-08-09 (×2): 1 mg via ORAL
  Filled 2018-08-08 (×2): qty 1

## 2018-08-08 MED ORDER — OXYCODONE HCL 5 MG PO TABS: 5.0000 mg | ORAL_TABLET | Freq: Once | ORAL | Status: DC | PRN

## 2018-08-08 MED ORDER — MIDAZOLAM HCL 2 MG/2ML IJ SOLN
INTRAMUSCULAR | Status: DC | PRN
  Administered 2018-08-08 (×2): 1 mg via INTRAVENOUS

## 2018-08-08 MED ORDER — LIDOCAINE 2% (20 MG/ML) 5 ML SYRINGE
INTRAMUSCULAR | Status: AC
  Filled 2018-08-08: qty 5

## 2018-08-08 MED ORDER — LACTATED RINGERS IV SOLN
INTRAVENOUS | Status: DC | PRN
  Administered 2018-08-08: 07:00:00 via INTRAVENOUS

## 2018-08-08 MED ORDER — METOCLOPRAMIDE HCL 5 MG PO TABS
5.0000 mg | ORAL_TABLET | Freq: Three times a day (TID) | ORAL | Status: DC | PRN
  Filled 2018-08-08: qty 2

## 2018-08-08 MED ORDER — TRANEXAMIC ACID-NACL 1000-0.7 MG/100ML-% IV SOLN
1000.0000 mg | INTRAVENOUS | Status: AC
  Administered 2018-08-08: 1000 mg via INTRAVENOUS
  Filled 2018-08-08: qty 100

## 2018-08-08 MED ORDER — PRESERVISION AREDS 2 PO CAPS: 1.0000 | ORAL_CAPSULE | Freq: Two times a day (BID) | ORAL | Status: DC

## 2018-08-08 MED ORDER — PHENYLEPHRINE 40 MCG/ML (10ML) SYRINGE FOR IV PUSH (FOR BLOOD PRESSURE SUPPORT)
PREFILLED_SYRINGE | INTRAVENOUS | Status: DC | PRN
  Administered 2018-08-08 (×4): 40 ug via INTRAVENOUS

## 2018-08-08 SURGICAL SUPPLY — 57 items
BLADE SURG SZ10 CARB STEEL (BLADE) ×4 IMPLANT
CHLORAPREP W/TINT 26 (MISCELLANEOUS) ×2 IMPLANT
CLOTH BEACON ORANGE TIMEOUT ST (SAFETY) ×2 IMPLANT
COVER PERINEAL POST (MISCELLANEOUS) ×2 IMPLANT
COVER SURGICAL LIGHT HANDLE (MISCELLANEOUS) ×2 IMPLANT
DECANTER SPIKE VIAL GLASS SM (MISCELLANEOUS) ×2 IMPLANT
DERMABOND ADVANCED (GAUZE/BANDAGES/DRESSINGS) ×2
DERMABOND ADVANCED .7 DNX12 (GAUZE/BANDAGES/DRESSINGS) ×2 IMPLANT
DRAPE IMP U-DRAPE 54X76 (DRAPES) ×2 IMPLANT
DRAPE SHEET LG 3/4 BI-LAMINATE (DRAPES) ×6 IMPLANT
DRAPE STERI IOBAN 125X83 (DRAPES) ×2 IMPLANT
DRAPE U-SHAPE 47X51 STRL (DRAPES) ×4 IMPLANT
DRSG AQUACEL AG ADV 3.5X10 (GAUZE/BANDAGES/DRESSINGS) ×2 IMPLANT
ELECT BLADE TIP CTD 4 INCH (ELECTRODE) ×2 IMPLANT
ELECT PENCIL ROCKER SW 15FT (MISCELLANEOUS) ×2 IMPLANT
ELECT REM PT RETURN 15FT ADLT (MISCELLANEOUS) ×2 IMPLANT
GAUZE SPONGE 4X4 12PLY STRL (GAUZE/BANDAGES/DRESSINGS) ×2 IMPLANT
GLOVE BIO SURGEON STRL SZ8.5 (GLOVE) ×20 IMPLANT
GLOVE BIOGEL PI IND STRL 8.5 (GLOVE) ×1 IMPLANT
GLOVE BIOGEL PI INDICATOR 8.5 (GLOVE) ×1
GOWN SPEC L3 XXLG W/TWL (GOWN DISPOSABLE) ×2 IMPLANT
HANDPIECE INTERPULSE COAX TIP (DISPOSABLE) ×1
HEAD FEMORAL 32 CERAMIC (Hips) ×2 IMPLANT
HOLDER FOLEY CATH W/STRAP (MISCELLANEOUS) ×2 IMPLANT
HOOD PEEL AWAY FLYTE STAYCOOL (MISCELLANEOUS) ×8 IMPLANT
JET LAVAGE IRRISEPT WOUND (IRRIGATION / IRRIGATOR) ×2
KIT TURNOVER KIT A (KITS) ×2 IMPLANT
LAVAGE JET IRRISEPT WOUND (IRRIGATION / IRRIGATOR) ×1 IMPLANT
LINER PINN ACET NEUT 32X52 ×2 IMPLANT
MANIFOLD NEPTUNE II (INSTRUMENTS) ×2 IMPLANT
MARKER SKIN DUAL TIP RULER LAB (MISCELLANEOUS) ×2 IMPLANT
NDL SAFETY ECLIPSE 18X1.5 (NEEDLE) ×1 IMPLANT
NEEDLE HYPO 18GX1.5 SHARP (NEEDLE) ×1
NEEDLE SPNL 18GX3.5 QUINCKE PK (NEEDLE) ×2 IMPLANT
PACK ANTERIOR HIP CUSTOM (KITS) ×2 IMPLANT
PIN SECTOR W/GRIP ACE CUP 52MM (Hips) ×2 IMPLANT
SAW OSC TIP CART 19.5X105X1.3 (SAW) ×2 IMPLANT
SCREW 6.5MMX25MM (Screw) ×2 IMPLANT
SCREW 6.5MMX30MM (Screw) ×2 IMPLANT
SEALER BIPOLAR AQUA 6.0 (INSTRUMENTS) ×2 IMPLANT
SET HNDPC FAN SPRY TIP SCT (DISPOSABLE) ×1 IMPLANT
STEM TRI LOC BPS SZ7 W GRIPTON (Hips) ×1 IMPLANT
SUT ETHIBOND NAB CT1 #1 30IN (SUTURE) ×4 IMPLANT
SUT MNCRL AB 3-0 PS2 18 (SUTURE) ×2 IMPLANT
SUT MNCRL AB 4-0 PS2 18 (SUTURE) ×2 IMPLANT
SUT MON AB 2-0 CT1 36 (SUTURE) ×4 IMPLANT
SUT STRATAFIX PDO 1 14 VIOLET (SUTURE) ×1
SUT STRATFX PDO 1 14 VIOLET (SUTURE) ×1
SUT VIC AB 2-0 CT1 27 (SUTURE) ×1
SUT VIC AB 2-0 CT1 TAPERPNT 27 (SUTURE) ×1 IMPLANT
SUTURE STRATFX PDO 1 14 VIOLET (SUTURE) ×1 IMPLANT
SYR 3ML LL SCALE MARK (SYRINGE) ×4 IMPLANT
SYR 50ML LL SCALE MARK (SYRINGE) ×2 IMPLANT
TRAY FOLEY MTR SLVR 16FR STAT (SET/KITS/TRAYS/PACK) ×2 IMPLANT
TRI LOC BPS SZ 7 W GRIPTON (Hips) ×2 IMPLANT
WATER STERILE IRR 1000ML POUR (IV SOLUTION) ×4 IMPLANT
YANKAUER SUCT BULB TIP 10FT TU (MISCELLANEOUS) ×2 IMPLANT

## 2018-08-08 NOTE — Anesthesia Procedure Notes (Signed)
Procedure Name: Intubation Date/Time: 08/08/2018 8:22 AM Performed by: Eben Burow, CRNA Pre-anesthesia Checklist: Patient identified, Emergency Drugs available, Suction available, Patient being monitored and Timeout performed Patient Re-evaluated:Patient Re-evaluated prior to induction Oxygen Delivery Method: Circle system utilized Preoxygenation: Pre-oxygenation with 100% oxygen Induction Type: IV induction and Rapid sequence Ventilation: Mask ventilation without difficulty Laryngoscope Size: Mac and 4 Grade View: Grade II Tube type: Oral Tube size: 7.0 mm Number of attempts: 1 Airway Equipment and Method: Stylet Placement Confirmation: ETT inserted through vocal cords under direct vision,  positive ETCO2 and breath sounds checked- equal and bilateral Secured at: 21 cm Tube secured with: Tape Dental Injury: Teeth and Oropharynx as per pre-operative assessment

## 2018-08-08 NOTE — Discharge Instructions (Signed)
° °Dr. Brian Swinteck °Joint Replacement Specialist °[facility] °[address] °[place],  [address] °[phone] ° ° °TOTAL HIP REPLACEMENT POSTOPERATIVE DIRECTIONS ° ° ° °Hip Rehabilitation, Guidelines Following Surgery  ° °WEIGHT BEARING °Weight bearing as tolerated with assist device (walker, cane, etc) as directed, use it as long as suggested by your surgeon or therapist, typically at least 4-6 weeks. ° °The results of a hip operation are greatly improved after range of motion and muscle strengthening exercises. Follow all safety measures which are given to protect your hip. If any of these exercises cause increased pain or swelling in your joint, decrease the amount until you are comfortable again. Then slowly increase the exercises. Call your caregiver if you have problems or questions.  ° °HOME CARE INSTRUCTIONS  °Most of the following instructions are designed to prevent the dislocation of your new hip.  °Remove items at home which could result in a fall. This includes throw rugs or furniture in walking pathways.  °Continue medications as instructed at time of discharge. °· You may have some home medications which will be placed on hold until you complete the course of blood thinner medication. °· You may start showering once you are discharged home. Do not remove your dressing. °Do not put on socks or shoes without following the instructions of your caregivers.   °Sit on chairs with arms. Use the chair arms to help push yourself up when arising.  °Arrange for the use of a toilet seat elevator so you are not sitting low.  °· Walk with walker as instructed.  °You may resume a sexual relationship in one month or when given the OK by your caregiver.  °Use walker as long as suggested by your caregivers.  °You may put full weight on your legs and walk as much as is comfortable. °Avoid periods of inactivity such as sitting longer than an hour when not asleep. This helps prevent  blood clots.  °You may return to work once you are cleared by your surgeon.  °Do not drive a car for 6 weeks or until released by your surgeon.  °Do not drive while taking narcotics.  °Wear elastic stockings for two weeks following surgery during the day but you may remove then at night.  °Make sure you keep all of your appointments after your operation with all of your doctors and caregivers. You should call the office at the above phone number and make an appointment for approximately two weeks after the date of your surgery. °Please pick up a stool softener and laxative for home use as long as you are requiring pain medications. °· ICE to the affected hip every three hours for 30 minutes at a time and then as needed for pain and swelling. Continue to use ice on the hip for pain and swelling from surgery. You may notice swelling that will progress down to the foot and ankle.  This is normal after surgery.  Elevate the leg when you are not up walking on it.   °It is important for you to complete the blood thinner medication as prescribed by your doctor. °· Continue to use the breathing machine which will help keep your temperature down.  It is common for your temperature to cycle up and down following surgery, especially at night when you are not up moving around and exerting yourself.  The breathing machine keeps your lungs expanded and your temperature down. ° °RANGE OF MOTION AND STRENGTHENING EXERCISES  °These exercises   are designed to help you keep full movement of your hip joint. Follow your caregiver's or physical therapist's instructions. Perform all exercises about fifteen times, three times per day or as directed. Exercise both hips, even if you have had only one joint replacement. These exercises can be done on a training (exercise) mat, on the floor, on a table or on a bed. Use whatever works the best and is most comfortable for you. Use music or television while you are exercising so that the exercises  are a pleasant break in your day. This will make your life better with the exercises acting as a break in routine you can look forward to.  Lying on your back, slowly slide your foot toward your buttocks, raising your knee up off the floor. Then slowly slide your foot back down until your leg is straight again.  Lying on your back spread your legs as far apart as you can without causing discomfort.  Lying on your side, raise your upper leg and foot straight up from the floor as far as is comfortable. Slowly lower the leg and repeat.  Lying on your back, tighten up the muscle in the front of your thigh (quadriceps muscles). You can do this by keeping your leg straight and trying to raise your heel off the floor. This helps strengthen the largest muscle supporting your knee.  Lying on your back, tighten up the muscles of your buttocks both with the legs straight and with the knee bent at a comfortable angle while keeping your heel on the floor.   SKILLED REHAB INSTRUCTIONS: If the patient is transferred to a skilled rehab facility following release from the hospital, a list of the current medications will be sent to the facility for the patient to continue.  When discharged from the skilled rehab facility, please have the facility set up the patient's Lyman prior to being released. Also, the skilled facility will be responsible for providing the patient with their medications at time of release from the facility to include their pain medication and their blood thinner medication. If the patient is still at the rehab facility at time of the two week follow up appointment, the skilled rehab facility will also need to assist the patient in arranging follow up appointment in our office and any transportation needs.  MAKE SURE YOU:  Understand these instructions.  Will watch your condition.  Will get help right away if you are not doing well or get worse.  Pick up stool softner and  laxative for home use following surgery while on pain medications. Do not remove your dressing. The dressing is waterproof--it is OK to take showers. Continue to use ice for pain and swelling after surgery. Do not use any lotions or creams on the incision until instructed by your surgeon. Total Hip Protocol  Information on my medicine - ELIQUIS (apixaban)  This medication education was reviewed with me or my healthcare representative as part of my discharge preparation.  The pharmacist that spoke with me during my hospital stay was:  Kasim Mccorkle A, RPH  Why was Eliquis prescribed for you? Eliquis was prescribed for you to reduce the risk of blood clots forming after orthopedic surgery.    What do You need to know about Eliquis? Take your Eliquis TWICE DAILY - one tablet in the morning and one tablet in the evening with or without food.  It would be best to take the dose about the same time each  day.  If you have difficulty swallowing the tablet whole please discuss with your pharmacist how to take the medication safely.  Take Eliquis exactly as prescribed by your doctor and DO NOT stop taking Eliquis without talking to the doctor who prescribed the medication.  Stopping without other medication to take the place of Eliquis may increase your risk of developing a clot.  After discharge, you should have regular check-up appointments with your healthcare provider that is prescribing your Eliquis.  What do you do if you miss a dose? If a dose of ELIQUIS is not taken at the scheduled time, take it as soon as possible on the same day and twice-daily administration should be resumed.  The dose should not be doubled to make up for a missed dose.  Do not take more than one tablet of ELIQUIS at the same time.  Important Safety Information A possible side effect of Eliquis is bleeding. You should call your healthcare provider right away if you experience any of the following: ? Bleeding  from an injury or your nose that does not stop. ? Unusual colored urine (red or dark brown) or unusual colored stools (red or black). ? Unusual bruising for unknown reasons. ? A serious fall or if you hit your head (even if there is no bleeding).  Some medicines may interact with Eliquis and might increase your risk of bleeding or clotting while on Eliquis. To help avoid this, consult your healthcare provider or pharmacist prior to using any new prescription or non-prescription medications, including herbals, vitamins, non-steroidal anti-inflammatory drugs (NSAIDs) and supplements.  This website has more information on Eliquis (apixaban): http://[email or web address].com/eliquis/home

## 2018-08-08 NOTE — Evaluation (Signed)
Physical Therapy Evaluation Patient Details Name: Lisa Villanueva MRN: [id number] DOB: 14-Oct-1945 Today's Date: 08/08/2018   History of Present Illness  73 yo female s/p R DA-THA on 08/08/18. PMH includes L THA, cardiomyopathy with pacemaker, metastatic breast cancer, CHF.  Clinical Impression   Pt presents with R hip post-surgical weakness, difficulty performing bed mobility, and decreased tolerance for ambulation post-surgery. Pt to benefit from acute PT to address deficits. Pt ambulated hallway distance with RW with min guard assist. Pt was very excited to be walking, and states she is much improved vs pre-surgery. Pt educated on ankle pumps (20/hour) to perform this afternoon/evening to increase circulation, to pt's tolerance and limited by pain. PT to progress mobility as tolerated, and will continue to follow acutely.        Follow Up Recommendations Follow surgeon's recommendation for DC plan and follow-up therapies;Supervision for mobility/OOB    Equipment Recommendations  Rolling walker with 5" wheels(? may not need, assess tomorrow. Pt has rollator with her in RV, has a RW at her home in [place]. )    Recommendations for Other Services       Precautions / Restrictions Precautions Precautions: Fall;ICD/Pacemaker Restrictions Weight Bearing Restrictions: No Other Position/Activity Restrictions: WBAT      Mobility  Bed Mobility Overal bed mobility: Needs Assistance Bed Mobility: Supine to Sit     Supine to sit: Min assist;HOB elevated     General bed mobility comments: Min assist for RLE management, trunk elevation with use of HHA. Increased time to scoot to EOB.   Transfers Overall transfer level: Needs assistance Equipment used: Rolling walker (2 wheeled) Transfers: Sit to/from Stand Sit to Stand: Min guard;From elevated surface         General transfer comment: Min guard for safety, verbal cuing for hand placement.   Ambulation/Gait Ambulation/Gait  assistance: Min guard Gait Distance (Feet): 90 Feet Assistive device: Rolling walker (2 wheeled) Gait Pattern/deviations: Step-to pattern;Step-through pattern;Decreased stride length;Trunk flexed Gait velocity: decr    General Gait Details: Min guard for safety. Verbal cuing for upright posture, sequencing although pt quickly progressing to step-through gait. Pt with increasing antalgic gait with further ambulation, pt still reporting 0/10 pain.  Stairs            Wheelchair Mobility    Modified Rankin (Stroke Patients Only)       Balance Overall balance assessment: Mild deficits observed, not formally tested                                           Pertinent Vitals/Pain Pain Assessment: No/denies pain    Home Living Family/patient expects to be discharged to:: Private residence Living Arrangements: Spouse/significant other Available Help at Discharge: Family Type of Home: Mobile home(Pt going to motor home for the next 2 weeks, then going to mobile home in [place]) Home Access: Stairs to enter   [employer] of Steps: 4 Home Layout: One level Home Equipment: Clinical cytogeneticist - 2 wheels;Walker - 4 wheels;Cane - single point;Grab bars - tub/shower      Prior Function Level of Independence: Needs assistance   Gait / Transfers Assistance Needed: used RW/rollator for mobility PTA.   ADL's / Homemaking Assistance Needed: Pt states her husband helped her with "everything", including bathing LEs and dressing.         Hand Dominance   Dominant Hand: Right  Extremity/Trunk Assessment   Upper Extremity Assessment Upper Extremity Assessment: Overall WFL for tasks assessed    Lower Extremity Assessment Lower Extremity Assessment: Overall WFL for tasks assessed;RLE deficits/detail RLE Deficits / Details: suspected post-surgical weakness; able to perform ankle pumps, quad set, heel slide, and SLR with lift assist  RLE Sensation: WNL     Cervical / Trunk Assessment Cervical / Trunk Assessment: Normal  Communication   Communication: No difficulties  Cognition Arousal/Alertness: Awake/alert Behavior During Therapy: WFL for tasks assessed/performed Overall Cognitive Status: Within Functional Limits for tasks assessed                                        General Comments      Exercises     Assessment/Plan    PT Assessment Patient needs continued PT services  PT Problem List Decreased strength;Decreased mobility;Decreased range of motion;Decreased activity tolerance;Decreased balance;Decreased knowledge of use of DME       PT Treatment Interventions DME instruction;Functional mobility training;Balance training;Patient/family education;Gait training;Therapeutic activities;Therapeutic exercise;Stair training    PT Goals (Current goals can be found in the Care Plan section)  Acute Rehab PT Goals Patient Stated Goal: walk without pain PT Goal Formulation: With patient Time For Goal Achievement: 08/15/18 Potential to Achieve Goals: Good    Frequency 7X/week   Barriers to discharge        Co-evaluation               AM-PAC PT "6 Clicks" Mobility  Outcome Measure Help needed turning from your back to your side while in a flat bed without using bedrails?: A Little Help needed moving from lying on your back to sitting on the side of a flat bed without using bedrails?: A Little Help needed moving to and from a bed to a chair (including a wheelchair)?: A Little Help needed standing up from a chair using your arms (e.g., wheelchair or bedside chair)?: A Little Help needed to walk in hospital room?: A Little Help needed climbing 3-5 steps with a railing? : A Lot 6 Click Score: 17    End of Session Equipment Utilized During Treatment: Gait belt Activity Tolerance: Patient tolerated treatment well Patient left: in chair;with chair alarm set;with call bell/phone within reach(SCD break for  skin integrity) Nurse Communication: Mobility status PT Visit Diagnosis: Other abnormalities of gait and mobility (R26.89);Difficulty in walking, not elsewhere classified (R26.2)    Time: 3212-2482 PT Time Calculation (min) (ACUTE ONLY): 28 min   Charges:   PT Evaluation $PT Eval Low Complexity: 1 Low PT Treatments $Gait Training: 8-22 mins       Julien Girt, PT Acute Rehabilitation Services Pager [phone]  Office [phone]   Lisa Villanueva 08/08/2018, 7:15 PM

## 2018-08-08 NOTE — Transfer of Care (Signed)
Immediate Anesthesia Transfer of Care Note  Patient: Lisa Villanueva  Procedure(s) Performed: TOTAL HIP ARTHROPLASTY ANTERIOR APPROACH (Right )  Patient Location: PACU  Anesthesia Type:General  Level of Consciousness: awake, alert  and oriented  Airway & Oxygen Therapy: Patient Spontanous Breathing and Patient connected to face mask oxygen  Post-op Assessment: Report given to RN and Post -op Vital signs reviewed and stable  Post vital signs: Reviewed and stable  Last Vitals:  Vitals Value Taken Time  BP 140/82 08/08/2018 11:03 AM  Temp    Pulse 107 08/08/2018 11:05 AM  Resp 16 08/08/2018 11:05 AM  SpO2 96 % 08/08/2018 11:05 AM  Vitals shown include unvalidated device data.  Last Pain:  Vitals:   08/08/18 0644  TempSrc:   PainSc: 3       Patients Stated Pain Goal: 3 (99/35/70 1779)  Complications: No apparent anesthesia complications

## 2018-08-08 NOTE — Interval H&P Note (Signed)
History and Physical Interval Note:  08/08/2018 7:56 AM  Lisa Villanueva  has presented today for surgery, with the diagnosis of right hip osteoarthritis.  The various methods of treatment have been discussed with the patient and family. After consideration of risks, benefits and other options for treatment, the patient has consented to  Procedure(s): TOTAL HIP ARTHROPLASTY ANTERIOR APPROACH (Right) as a surgical intervention.  The patient's history has been reviewed, patient examined, no change in status, stable for surgery.  I have reviewed the patient's chart and labs.  Questions were answered to the patient's satisfaction.    The risks, benefits, and alternatives were discussed with the patient. There are risks associated with the surgery including, but not limited to, problems with anesthesia (death), infection, instability (giving out of the joint), dislocation, differences in leg length/angulation/rotation, fracture of bones, loosening or failure of implants, hematoma (blood accumulation) which may require surgical drainage, blood clots, pulmonary embolism, nerve injury (foot drop and lateral thigh numbness), and blood vessel injury. The patient understands these risks and elects to proceed.    Hilton Cork Emojean Gertz

## 2018-08-08 NOTE — Anesthesia Postprocedure Evaluation (Signed)
Anesthesia Post Note  Patient: Lisa Villanueva  Procedure(s) Performed: TOTAL HIP ARTHROPLASTY ANTERIOR APPROACH (Right )     Patient location during evaluation: PACU Anesthesia Type: Spinal Level of consciousness: awake and alert Pain management: pain level controlled Vital Signs Assessment: post-procedure vital signs reviewed and stable Respiratory status: spontaneous breathing, nonlabored ventilation, respiratory function stable and patient connected to nasal cannula oxygen Cardiovascular status: blood pressure returned to baseline and stable Postop Assessment: no apparent nausea or vomiting Anesthetic complications: no    Last Vitals:  Vitals:   08/08/18 1442 08/08/18 1535  BP: 120/67 108/64  Pulse: 88 90  Resp: 16 13  Temp:  (!) 36.4 C  SpO2: 100% 99%    Last Pain:  Vitals:   08/08/18 1535  TempSrc: Oral  PainSc:    Pain Goal: Patients Stated Pain Goal: 3 (08/08/18 0644)  LLE Motor Response: Purposeful movement (08/08/18 2015)   RLE Motor Response: Purposeful movement (08/08/18 2015)          Bright Spielmann

## 2018-08-08 NOTE — Anesthesia Procedure Notes (Signed)
Spinal  Patient location during procedure: OR Start time: 08/08/2018 8:00 AM End time: 08/08/2018 8:20 AM Staffing Anesthesiologist: Janeece Riggers, MD Preanesthetic Checklist Completed: patient identified, site marked, surgical consent, pre-op evaluation, timeout performed, IV checked, risks and benefits discussed and monitors and equipment checked Spinal Block Patient position: sitting Prep: DuraPrep Patient monitoring: heart rate, cardiac monitor, continuous pulse ox and blood pressure Approach: midline Location: L3-4 Injection technique: single-shot Needle Needle type: Sprotte  Needle gauge: 24 G Needle length: 9 cm Assessment Sensory level: T4 Events: failed spinal Additional Notes MULTIPLE ATTEMPTS AT L 2/3/4 WITH OUT SUCCESS ALL OSSO

## 2018-08-08 NOTE — Op Note (Signed)
OPERATIVE REPORT  SURGEON: Rod Can, MD   ASSISTANT: Nehemiah Massed, PA-C.  PREOPERATIVE DIAGNOSIS: Right hip avascular necrosis.   POSTOPERATIVE DIAGNOSIS:  Right hip avascular necrosis.   PROCEDURE: Right total hip arthroplasty, anterior approach.   IMPLANTS: DePuy Tri Lock stem, size 7, hi offset. DePuy Pinnacle Cup, size 52 mm. DePuy Altrx liner, size 32 by 52 mm, neutral. DePuy Biolox ceramic head ball, size 32 + 1 mm. 6.5 mm cancellous bone screws x2.  ANESTHESIA:  General  ESTIMATED BLOOD LOSS:-150 mL    ANTIBIOTICS: 2 g Ancef.  DRAINS: None.  COMPLICATIONS: None.   CONDITION: PACU - hemodynamically stable.   BRIEF CLINICAL NOTE: Lisa Villanueva is a 73 y.o. female with a long-standing history of Right hip avascular necrosis with collapse. After failing conservative management, the patient was indicated for total hip arthroplasty. The risks, benefits, and alternatives to the procedure were explained, and the patient elected to proceed.  The patient does have a history of metastatic breast cancer.  Of note, preoperative CT scan showed small metastatic lesions to the proximal and mid femur, as well as a larger lesion to the right ilium, all well away from the surgical site.  I discussed the situation with the patient's oncologist, and he is going to plan for postoperative radiation therapy to these areas.  PROCEDURE IN DETAIL: Surgical site was marked by myself in the pre-op holding area. Once inside the operating room, spinal anesthesia was attempted unsuccessfully.  Then general anesthesia was obtained, and a foley catheter was inserted. The patient was then positioned on the Hana table. All bony prominences were well padded. The hip was prepped and draped in the normal sterile surgical fashion. A time-out was called verifying side and site of surgery. The patient received IV antibiotics within 60 minutes of beginning the procedure.  The direct anterior  approach to the hip was performed through the Hueter interval. Lateral femoral circumflex vessels were treated with the Auqumantys.  The patient's TFL was extremely fibrotic.  The anterior capsule was exposed and an inverted T capsulotomy was made.The femoral neck cut was made to the level of the templated cut. A corkscrew was placed into the head and the head was removed. The femoral head was found to have delaminated cartilage and eburnated bone.  The head was over 50% collapsed.  The head was passed to the back table and was measured.  Acetabular exposure was achieved, and the pulvinar and labrum were excised. Sequential reaming of the acetabulum was then performed up to a size 51 mm reamer. A 52 mm cup was then opened and impacted into place at approximately 40 degrees of abduction and 20 degrees of anteversion.  I elected to augment the already acceptable press-fit fixation with 6.5 mm cancellus bone screws x2.  The final polyethylene liner was impacted into place and acetabular osteophytes were removed.   I then gained femoral exposure taking care to protect the abductors and greater trochanter. This was performed using standard external rotation, extension, and adduction. The capsule was peeled off the inner aspect of the greater trochanter, taking care to preserve the short external rotators. A cookie cutter was used to enter the femoral canal, and then the femoral canal finder was placed. Sequential broaching was performed up to a size 7. Calcar planer was used on the femoral neck remnant. I placed a hi offset neck and a trial head ball. The hip was reduced. Leg lengths and offset were checked fluoroscopically. The hip was dislocated  and trial components were removed. The final implants were placed, and the hip was reduced.  Fluoroscopy was used to confirm component position and leg lengths. At 90 degrees of external rotation and full extension, the hip was stable to an anterior directed  force.  The wound was copiously irrigated with normal saline using pulse lavage. Marcaine solution was injected into the periarticular soft tissue. The wound was closed in layers using #1 Vicryl and V-Loc for the fascia, 2-0 Vicryl for the subcutaneous fat, 2-0 Monocryl for the deep dermal layer, 3-0 running Monocryl subcuticular stitch, and Dermabond for the skin. Once the glue was fully dried, an Aquacell Ag dressing was applied. The patient was transported to the recovery room in stable condition. Sponge, needle, and instrument counts were correct at the end of the case x2. The patient tolerated the procedure well and there were no known complications.  Please note that a surgical assistant was a medical necessity for this procedure to perform it in a safe and expeditious manner. Assistant was necessary to provide appropriate retraction of vital neurovascular structures, to prevent femoral fracture, and to allow for anatomic placement of the prosthesis.

## 2018-08-09 ENCOUNTER — Encounter (HOSPITAL_COMMUNITY): Payer: Self-pay | Admitting: Orthopedic Surgery

## 2018-08-09 LAB — CBC
HCT: 30.8 % — ABNORMAL LOW (ref 36.0–46.0)
Hemoglobin: 9.4 g/dL — ABNORMAL LOW (ref 12.0–15.0)
MCH: 29.5 pg (ref 26.0–34.0)
MCHC: 30.5 g/dL (ref 30.0–36.0)
MCV: 96.6 fL (ref 80.0–100.0)
Platelets: 239 10*3/uL (ref 150–400)
RBC: 3.19 MIL/uL — ABNORMAL LOW (ref 3.87–5.11)
RDW: 15 % (ref 11.5–15.5)
WBC: 12.6 10*3/uL — ABNORMAL HIGH (ref 4.0–10.5)
nRBC: 0 % (ref 0.0–0.2)

## 2018-08-09 LAB — BASIC METABOLIC PANEL WITH GFR
Anion gap: 6 (ref 5–15)
BUN: 21 mg/dL (ref 8–23)
CO2: 22 mmol/L (ref 22–32)
Calcium: 8.5 mg/dL — ABNORMAL LOW (ref 8.9–10.3)
Chloride: 111 mmol/L (ref 98–111)
Creatinine, Ser: 1.14 mg/dL — ABNORMAL HIGH (ref 0.44–1.00)
GFR calc Af Amer: 56 mL/min — ABNORMAL LOW
GFR calc non Af Amer: 49 mL/min — ABNORMAL LOW
Glucose, Bld: 126 mg/dL — ABNORMAL HIGH (ref 70–99)
Potassium: 4.5 mmol/L (ref 3.5–5.1)
Sodium: 139 mmol/L (ref 135–145)

## 2018-08-09 MED ORDER — SENNA 8.6 MG PO TABS: 2.0000 | ORAL_TABLET | Freq: Every day | ORAL | 1 refills | Status: AC

## 2018-08-09 MED ORDER — APIXABAN 2.5 MG PO TABS: 2.5000 mg | ORAL_TABLET | Freq: Two times a day (BID) | ORAL | 0 refills | Status: AC

## 2018-08-09 MED ORDER — ONDANSETRON HCL 4 MG PO TABS: 4.0000 mg | ORAL_TABLET | Freq: Four times a day (QID) | ORAL | 0 refills | Status: AC | PRN

## 2018-08-09 MED ORDER — DOCUSATE SODIUM 100 MG PO CAPS: 100.0000 mg | ORAL_CAPSULE | Freq: Two times a day (BID) | ORAL | 1 refills | Status: AC

## 2018-08-09 MED ORDER — HYDROCODONE-ACETAMINOPHEN 5-325 MG PO TABS: 1.0000 | ORAL_TABLET | ORAL | 0 refills | Status: DC | PRN

## 2018-08-09 MED ORDER — SODIUM CHLORIDE 0.9 % IV BOLUS
500.0000 mL | Freq: Once | INTRAVENOUS | Status: AC
  Administered 2018-08-09: 10:00:00 500 mL via INTRAVENOUS

## 2018-08-09 NOTE — Plan of Care (Signed)

## 2018-08-09 NOTE — Discharge Summary (Signed)
Physician Discharge Summary  Patient ID: Lisa Villanueva MRN: [id number] DOB/AGE: [birth year] 73 y.o.  Admit date: 08/08/2018 Discharge date: 08/09/2018  Admission Diagnoses:  Avascular necrosis of hip, right [facility])  Discharge Diagnoses:  Principal Problem:   Avascular necrosis of hip, right [facility])   Past Medical History:  Diagnosis Date  . AICD (automatic cardioverter/defibrillator) present   . Cancer ([place])    Metastic Breast Cancer  . Cardiomyopathy ([place])   . CHF (congestive heart failure) ([place])   . Complication of anesthesia   . Macular degeneration   . Pneumonia   . PONV (postoperative nausea and vomiting)     Surgeries: Procedure(s): TOTAL HIP ARTHROPLASTY ANTERIOR APPROACH on 08/08/2018   Consultants (if any):   Discharged Condition: Improved  Hospital Course: Lisa Villanueva is an 73 y.o. female who was admitted 08/08/2018 with a diagnosis of Avascular necrosis of hip, right ([place]) and went to the operating room on 08/08/2018 and underwent the above named procedures.    She was given perioperative antibiotics:  Anti-infectives (From admission, onward)   Start     Dose/Rate Route Frequency Ordered Stop   08/08/18 1400  ceFAZolin (ANCEF) IVPB 2g/100 mL premix     2 g 200 mL/hr over 30 Minutes Intravenous Every 6 hours 08/08/18 1236 08/08/18 2043   08/08/18 0600  ceFAZolin (ANCEF) IVPB 2g/100 mL premix     2 g 200 mL/hr over 30 Minutes Intravenous On call to O.R. 08/08/18 0559 08/08/18 6213    .  She was given sequential compression devices, early ambulation, and apixaban for DVT prophylaxis.  She benefited maximally from the hospital stay and there were no complications.    Recent vital signs:  Vitals:   08/09/18 1152 08/09/18 1415  BP: 106/62 114/62  Pulse: (!) 104 (!) 102  Resp: 18   Temp: (!) 97.5 F (36.4 C) (!) 97.5 F (36.4 C)  SpO2: 99% 98%    Recent laboratory studies:  Lab Results  Component Value Date   HGB 9.4 (L) 08/09/2018   HGB  13.4 08/05/2018   Lab Results  Component Value Date   WBC 12.6 (H) 08/09/2018   PLT 239 08/09/2018   No results found for: INR Lab Results  Component Value Date   NA 139 08/09/2018   K 4.5 08/09/2018   CL 111 08/09/2018   CO2 22 08/09/2018   BUN 21 08/09/2018   CREATININE 1.14 (H) 08/09/2018   GLUCOSE 126 (H) 08/09/2018    Discharge Medications:   Allergies as of 08/09/2018   No Known Allergies     Medication List    TAKE these medications   anastrozole 1 MG tablet Commonly known as:  ARIMIDEX Take 1 mg by mouth daily.   apixaban 2.5 MG Tabs tablet Commonly known as:  ELIQUIS Take 1 tablet (2.5 mg total) by mouth every 12 (twelve) hours.   aspirin EC 81 MG tablet Take 81 mg by mouth daily.   carvedilol 12.5 MG tablet Commonly known as:  COREG Take 12.5 mg by mouth 2 (two) times a day.   CoQ-10 200 MG Caps Take 200 mg by mouth daily.   diphenhydramine-acetaminophen 25-500 MG Tabs tablet Commonly known as:  TYLENOL PM Take 2 tablets by mouth at bedtime.   docusate sodium 100 MG capsule Commonly known as:  COLACE Take 1 capsule (100 mg total) by mouth 2 (two) times daily.   Fish Oil 1200 MG Caps Take 1,200 mg by mouth daily.   furosemide 20 MG  tablet Commonly known as:  LASIX Take 20 mg by mouth daily.   HYDROcodone-acetaminophen 5-325 MG tablet Commonly known as:  NORCO/VICODIN Take 1 tablet by mouth every 4 (four) hours as needed for moderate pain (pain score 4-6).   ondansetron 4 MG tablet Commonly known as:  ZOFRAN Take 1 tablet (4 mg total) by mouth every 6 (six) hours as needed for nausea (use 1st).   PreserVision AREDS 2 Caps Take 1 capsule by mouth 2 (two) times a day.   REFRESH OP Place 1 drop into both eyes 2 (two) times a day.   senna 8.6 MG Tabs tablet Commonly known as:  SENOKOT Take 2 tablets (17.2 mg total) by mouth at bedtime.   spironolactone 25 MG tablet Commonly known as:  ALDACTONE Take 25 mg by mouth daily.   vitamin C  500 MG tablet Commonly known as:  ASCORBIC ACID Take 500 mg by mouth daily.   Vitamin D3 125 MCG (5000 UT) Caps Take 5,000 Units by mouth daily.       Diagnostic Studies: Dg Pelvis Portable  Result Date: 08/08/2018 CLINICAL DATA:  Postop right hip replacement EXAM: PORTABLE PELVIS 1-2 VIEWS COMPARISON:  Earlier same day FINDINGS: Recent total hip arthroplasty on the right has a good appearance. Previous left hip arthroplasty also has a good appearance. IMPRESSION: Addition of right hip arthroplasty today. Good radiographic appearance. Electronically Signed   By: Nelson Chimes M.D.   On: 08/08/2018 11:38   Dg C-arm 1-60 Min-no Report  Result Date: 08/08/2018 Fluoroscopy was utilized by the requesting physician.  No radiographic interpretation.   Dg Hip Operative Unilat With Pelvis Right  Result Date: 08/08/2018 CLINICAL DATA:  Right hip replacement EXAM: OPERATIVE RIGHT HIP WITH PELVIS COMPARISON:  None. FLUOROSCOPY TIME:  Radiation Exposure Index (as provided by the fluoroscopic device): 2.1 mGy If the device does not provide the exposure index: Fluoroscopy Time:  19 seconds Number of Acquired Images:  2 FINDINGS: Right hip replacement is noted in satisfactory position. No acute bony abnormality is seen. IMPRESSION: Status post right hip replacement. Electronically Signed   By: Inez Catalina M.D.   On: 08/08/2018 10:39    Disposition:    Discharge Instructions    Call MD / Call 911   Complete by:  As directed    If you experience chest pain or shortness of breath, CALL 911 and be transported to the hospital emergency room.  If you develope a fever above 101 F, pus (white drainage) or increased drainage or redness at the wound, or calf pain, call your surgeon's office.   Constipation Prevention   Complete by:  As directed    Drink plenty of fluids.  Prune juice may be helpful.  You may use a stool softener, such as Colace (over the counter) 100 mg twice a day.  Use MiraLax (over the  counter) for constipation as needed.   Diet - low sodium heart healthy   Complete by:  As directed    Driving restrictions   Complete by:  As directed    No driving for 6 weeks   Increase activity slowly as tolerated   Complete by:  As directed    Lifting restrictions   Complete by:  As directed    No lifting for 6 weeks   TED hose   Complete by:  As directed    Use stockings (TED hose) for 2 weeks on both leg(s).  You may remove them at night for sleeping.  Follow-up Information    Maynor Mwangi, Aaron Edelman, MD. Schedule an appointment as soon as possible for a visit in 2 weeks.   Specialty:  Orthopedic Surgery Why:  For wound re-check Contact information: [address] [place] [address] [phone]            Signed: Hilton Cork Perri Lamagna 08/10/2018, 11:50 AM

## 2018-08-09 NOTE — Progress Notes (Signed)
Physical Therapy Treatment Patient Details Name: Lisa Villanueva MRN: [id number] DOB: [birth year] Today's Date: 08/09/2018    History of Present Illness 73 yo female s/p R DA-THA on 08/08/18. PMH includes L THA, cardiomyopathy with pacemaker, metastatic breast cancer, CHF.    PT Comments    POD # 1 am session assisted OOB to amb in hallway.  Then returned to room to perform some TE's following HEP handout.  Instructed on proper tech, freq as well as use of ICE.     Follow Up Recommendations  Follow surgeon's recommendation for DC plan and follow-up therapies;Supervision for mobility/OOB     Equipment Recommendations  Rolling walker with 5" wheels    Recommendations for Other Services       Precautions / Restrictions Precautions Precautions: Fall;ICD/Pacemaker Restrictions Weight Bearing Restrictions: No Other Position/Activity Restrictions: WBAT    Mobility  Bed Mobility Overal bed mobility: Needs Assistance Bed Mobility: Supine to Sit     Supine to sit: Min assist;HOB elevated        Transfers Overall transfer level: Needs assistance Equipment used: Rolling walker (2 wheeled) Transfers: Sit to/from Stand Sit to Stand: Min guard;From elevated surface         General transfer comment: Min guard for safety, verbal cuing for hand placement.   Ambulation/Gait Ambulation/Gait assistance: Min guard Gait Distance (Feet): 45 Feet Assistive device: Rolling walker (2 wheeled) Gait Pattern/deviations: Step-to pattern;Step-through pattern;Decreased stride length;Trunk flexed Gait velocity: decr    General Gait Details: 25% VC's on safety with turns    Marine scientist Rankin (Stroke Patients Only)       Balance                                            Cognition Arousal/Alertness: Awake/alert Behavior During Therapy: WFL for tasks assessed/performed Overall Cognitive Status: Within  Functional Limits for tasks assessed                                        Exercises   Total Hip Replacement TE's 10 reps ankle pumps 10 reps knee presses 10 reps heel slides 10 reps SAQ's 10 reps ABD Followed by ICE     General Comments        Pertinent Vitals/Pain Pain Assessment: 0-10 Pain Score: 3  Pain Location: R hip Pain Descriptors / Indicators: Grimacing;Tender Pain Intervention(s): Monitored during session;Repositioned;Premedicated before session;Ice applied    Home Living                      Prior Function            PT Goals (current goals can now be found in the care plan section) Progress towards PT goals: Progressing toward goals    Frequency    7X/week      PT Plan Current plan remains appropriate    Co-evaluation              AM-PAC PT "6 Clicks" Mobility   Outcome Measure  Help needed turning from your back to your side while in a flat bed without using bedrails?: A Little Help needed moving from lying on your back to sitting on the  side of a flat bed without using bedrails?: A Little Help needed moving to and from a bed to a chair (including a wheelchair)?: A Little Help needed standing up from a chair using your arms (e.g., wheelchair or bedside chair)?: A Little Help needed to walk in hospital room?: A Little Help needed climbing 3-5 steps with a railing? : A Lot 6 Click Score: 17    End of Session Equipment Utilized During Treatment: Gait belt Activity Tolerance: Patient tolerated treatment well Patient left: in chair;with chair alarm set;with call bell/phone within reach Nurse Communication: Mobility status PT Visit Diagnosis: Other abnormalities of gait and mobility (R26.89);Difficulty in walking, not elsewhere classified (R26.2)     Time: 4097-3532 PT Time Calculation (min) (ACUTE ONLY): 40 min  Charges:  $Gait Training: 8-22 mins $Therapeutic Exercise: 8-22 mins $Therapeutic Activity:  8-22 mins                     Rica Koyanagi  PTA Acute  Rehabilitation Services Pager      [phone] Office      [phone]

## 2018-08-09 NOTE — Progress Notes (Signed)
Physical Therapy Treatment Patient Details Name: Lisa Villanueva MRN: [id number] DOB: 1945-04-03 Today's Date: 08/09/2018    History of Present Illness 73 yo female s/p R DA-THA on 08/08/18. PMH includes L THA, cardiomyopathy with pacemaker, metastatic breast cancer, CHF.    PT Comments    POD # 1 pm session Assisted with amb in hallway, practiced stairs.  Completed standing TE's following HEP handout.  cervical dysplasia   Follow Up Recommendations  Follow surgeon's recommendation for DC plan and follow-up therapies;Supervision for mobility/OOB     Equipment Recommendations  Rolling walker with 5" wheels    Recommendations for Other Services       Precautions / Restrictions Precautions Precautions: Fall;ICD/Pacemaker Restrictions Weight Bearing Restrictions: No Other Position/Activity Restrictions: WBAT    Mobility  Bed Mobility Overal bed mobility: Needs Assistance Bed Mobility: Supine to Sit     Supine to sit: Min assist;HOB elevated        Transfers Overall transfer level: Needs assistance Equipment used: Rolling walker (2 wheeled) Transfers: Sit to/from Stand Sit to Stand: Min guard;From elevated surface         General transfer comment: Min guard for safety, verbal cuing for hand placement.   Ambulation/Gait Ambulation/Gait assistance: Min guard Gait Distance (Feet): 25 Feet Assistive device: Rolling walker (2 wheeled) Gait Pattern/deviations: Step-to pattern;Step-through pattern;Decreased stride length;Trunk flexed Gait velocity: decr    General Gait Details: 25% VC's on safety with turns    Stairs Stairs: Yes Stairs assistance: Min guard;Min assist Stair Management: Two rails;Step to pattern;Forwards Number of Stairs: 2 General stair comments: performed twice.  25% Vc's on proper sequencing and safety    Wheelchair Mobility    Modified Rankin (Stroke Patients Only)       Balance                                             Cognition Arousal/Alertness: Awake/alert Behavior During Therapy: WFL for tasks assessed/performed Overall Cognitive Status: Within Functional Limits for tasks assessed                                        Exercises  5 reps each standing TE's    General Comments        Pertinent Vitals/Pain Pain Assessment: 0-10 Pain Score: 3  Pain Location: R hip Pain Descriptors / Indicators: Grimacing;Tender Pain Intervention(s): Monitored during session;Repositioned;Premedicated before session;Ice applied    Home Living                      Prior Function            PT Goals (current goals can now be found in the care plan section) Progress towards PT goals: Progressing toward goals    Frequency    7X/week      PT Plan Current plan remains appropriate    Co-evaluation              AM-PAC PT "6 Clicks" Mobility   Outcome Measure  Help needed turning from your back to your side while in a flat bed without using bedrails?: A Little Help needed moving from lying on your back to sitting on the side of a flat bed without using bedrails?: A Little Help needed moving to and from  a bed to a chair (including a wheelchair)?: A Little Help needed standing up from a chair using your arms (e.g., wheelchair or bedside chair)?: A Little Help needed to walk in hospital room?: A Little Help needed climbing 3-5 steps with a railing? : A Lot 6 Click Score: 17    End of Session Equipment Utilized During Treatment: Gait belt Activity Tolerance: Patient tolerated treatment well Patient left: in chair;with chair alarm set;with call bell/phone within reach Nurse Communication: Mobility status(pt ready for D/C to home) PT Visit Diagnosis: Other abnormalities of gait and mobility (R26.89);Difficulty in walking, not elsewhere classified (R26.2)     Time: 1345-1410 PT Time Calculation (min) (ACUTE ONLY): 25 min  Charges:  $Gait Training: 8-22  mins $Therapeutic Activity: 8-22 mins                     Rica Koyanagi  PTA Acute  Rehabilitation Services Pager      [phone] Office      [phone]

## 2018-08-09 NOTE — TOC Transition Note (Signed)
Transition of Care [facility]) - CM/SW Discharge Note   Patient Details  Name: Lisa Villanueva MRN: [id number] Date of Birth: 04/11/1945  [facility] of [facility] CM/SW Contact:  Joaquin Courts, RN Phone Number: 08/09/2018, 10:09 AM   Clinical Narrative:  CM spoke with pt who reports will go home with HEP, reports has a RW and 3-in-1. No further needs identified.            Patient Goals and CMS Choice        Discharge Placement                       Discharge Plan and Services                                     Social Determinants of Health (SDOH) Interventions     Readmission Risk Interventions No flowsheet data found.

## 2018-08-09 NOTE — Progress Notes (Signed)
    Subjective:  Patient reports pain as mild.  Denies N/V/CP/SOB. States hip feels great.  Objective:   VITALS:   Vitals:   08/09/18 0545 08/09/18 0800 08/09/18 0953 08/09/18 1152  BP:  106/60 (!) 97/56 106/62  Pulse: (!) 105 (!) 112 (!) 116 (!) 104  Resp:  16 16 18   Temp:  98 F (36.7 C) 98.1 F (36.7 C) (!) 97.5 F (36.4 C)  TempSrc:    Oral  SpO2:  100% 98% 99%  Weight:      Height:        NAD ABD soft Sensation intact distally Intact pulses distally Dorsiflexion/Plantar flexion intact Incision: dressing C/D/I Compartment soft   Lab Results  Component Value Date   WBC 12.6 (H) 08/09/2018   HGB 9.4 (L) 08/09/2018   HCT 30.8 (L) 08/09/2018   MCV 96.6 08/09/2018   PLT 239 08/09/2018   BMET    Component Value Date/Time   NA 139 08/09/2018 0327   K 4.5 08/09/2018 0327   CL 111 08/09/2018 0327   CO2 22 08/09/2018 0327   GLUCOSE 126 (H) 08/09/2018 0327   BUN 21 08/09/2018 0327   CREATININE 1.14 (H) 08/09/2018 0327   CALCIUM 8.5 (L) 08/09/2018 0327   GFRNONAA 49 (L) 08/09/2018 0327   GFRAA 56 (L) 08/09/2018 0327     Assessment/Plan: 1 Day Post-Op   Principal Problem:   Avascular necrosis of hip, right ([place])   WBAT with walker DVT ppx: apixaban, SCDs, TEDS PO pain control PT/OT Hypotension: secondary to volume loss, asymptomatic, responded to IVFs Dispo: d/c home with HEP  Hilton Cork Alliah Boulanger 08/09/2018, 1:02 PM   Rod Can, MD Cell: [phone] [place] [facility] now [facility]  [place] [address], [address], [place], [place] [address] Phone: [phone] [email or web address] Facebook  [employer]

## 2018-08-12 ENCOUNTER — Encounter (HOSPITAL_COMMUNITY): Payer: Self-pay | Admitting: Orthopedic Surgery

## 2018-08-14 ENCOUNTER — Emergency Department (HOSPITAL_COMMUNITY): Payer: Medicare PPO

## 2018-08-14 ENCOUNTER — Other Ambulatory Visit: Payer: Self-pay

## 2018-08-14 ENCOUNTER — Observation Stay (HOSPITAL_COMMUNITY)
Admission: EM | Admit: 2018-08-14 | Discharge: 2018-08-15 | Disposition: A | Discharge status: Home / Self Care | Payer: Medicare PPO | Attending: Internal Medicine | Admitting: Internal Medicine

## 2018-08-14 ENCOUNTER — Encounter (HOSPITAL_COMMUNITY): Payer: Self-pay | Admitting: Internal Medicine

## 2018-08-14 DIAGNOSIS — Z95810 Presence of automatic (implantable) cardiac defibrillator: Secondary | ICD-10-CM | POA: Diagnosis not present

## 2018-08-14 DIAGNOSIS — Z7901 Long term (current) use of anticoagulants: Secondary | ICD-10-CM | POA: Insufficient documentation

## 2018-08-14 DIAGNOSIS — R945 Abnormal results of liver function studies: Secondary | ICD-10-CM | POA: Insufficient documentation

## 2018-08-14 DIAGNOSIS — R079 Chest pain, unspecified: Secondary | ICD-10-CM | POA: Diagnosis not present

## 2018-08-14 DIAGNOSIS — R0789 Other chest pain: Secondary | ICD-10-CM | POA: Diagnosis not present

## 2018-08-14 DIAGNOSIS — M879 Osteonecrosis, unspecified: Secondary | ICD-10-CM | POA: Insufficient documentation

## 2018-08-14 DIAGNOSIS — R112 Nausea with vomiting, unspecified: Secondary | ICD-10-CM | POA: Diagnosis present

## 2018-08-14 DIAGNOSIS — Z7982 Long term (current) use of aspirin: Secondary | ICD-10-CM | POA: Diagnosis not present

## 2018-08-14 DIAGNOSIS — D72829 Elevated white blood cell count, unspecified: Secondary | ICD-10-CM | POA: Insufficient documentation

## 2018-08-14 DIAGNOSIS — I5022 Chronic systolic (congestive) heart failure: Secondary | ICD-10-CM | POA: Diagnosis not present

## 2018-08-14 DIAGNOSIS — G893 Neoplasm related pain (acute) (chronic): Secondary | ICD-10-CM

## 2018-08-14 DIAGNOSIS — I480 Paroxysmal atrial fibrillation: Secondary | ICD-10-CM | POA: Insufficient documentation

## 2018-08-14 DIAGNOSIS — Z1159 Encounter for screening for other viral diseases: Secondary | ICD-10-CM | POA: Diagnosis not present

## 2018-08-14 DIAGNOSIS — R000 Tachycardia, unspecified: Secondary | ICD-10-CM | POA: Insufficient documentation

## 2018-08-14 DIAGNOSIS — H3530 Unspecified macular degeneration: Secondary | ICD-10-CM | POA: Diagnosis not present

## 2018-08-14 DIAGNOSIS — I429 Cardiomyopathy, unspecified: Secondary | ICD-10-CM | POA: Insufficient documentation

## 2018-08-14 DIAGNOSIS — Z96643 Presence of artificial hip joint, bilateral: Secondary | ICD-10-CM | POA: Diagnosis not present

## 2018-08-14 DIAGNOSIS — Z79891 Long term (current) use of opiate analgesic: Secondary | ICD-10-CM | POA: Insufficient documentation

## 2018-08-14 DIAGNOSIS — Z79899 Other long term (current) drug therapy: Secondary | ICD-10-CM | POA: Diagnosis not present

## 2018-08-14 DIAGNOSIS — C7951 Secondary malignant neoplasm of bone: Secondary | ICD-10-CM | POA: Insufficient documentation

## 2018-08-14 DIAGNOSIS — M87051 Idiopathic aseptic necrosis of right femur: Secondary | ICD-10-CM | POA: Diagnosis present

## 2018-08-14 DIAGNOSIS — R52 Pain, unspecified: Secondary | ICD-10-CM

## 2018-08-14 DIAGNOSIS — C50919 Malignant neoplasm of unspecified site of unspecified female breast: Secondary | ICD-10-CM | POA: Diagnosis not present

## 2018-08-14 LAB — CBC WITH DIFFERENTIAL/PLATELET
Abs Immature Granulocytes: 0.14 10*3/uL — ABNORMAL HIGH (ref 0.00–0.07)
Basophils Absolute: 0 10*3/uL (ref 0.0–0.1)
Basophils Relative: 0 %
Eosinophils Absolute: 0.1 10*3/uL (ref 0.0–0.5)
Eosinophils Relative: 1 %
HCT: 32.7 % — ABNORMAL LOW (ref 36.0–46.0)
Hemoglobin: 10.4 g/dL — ABNORMAL LOW (ref 12.0–15.0)
Immature Granulocytes: 1 %
Lymphocytes Relative: 7 %
Lymphs Abs: 1.2 10*3/uL (ref 0.7–4.0)
MCH: 29.8 pg (ref 26.0–34.0)
MCHC: 31.8 g/dL (ref 30.0–36.0)
MCV: 93.7 fL (ref 80.0–100.0)
Monocytes Absolute: 1.6 10*3/uL — ABNORMAL HIGH (ref 0.1–1.0)
Monocytes Relative: 10 %
Neutro Abs: 13.1 10*3/uL — ABNORMAL HIGH (ref 1.7–7.7)
Neutrophils Relative %: 81 %
Platelets: 379 10*3/uL (ref 150–400)
RBC: 3.49 MIL/uL — ABNORMAL LOW (ref 3.87–5.11)
RDW: 15.5 % (ref 11.5–15.5)
WBC: 16.2 10*3/uL — ABNORMAL HIGH (ref 4.0–10.5)
nRBC: 0.1 % (ref 0.0–0.2)

## 2018-08-14 LAB — COMPREHENSIVE METABOLIC PANEL
ALT: 58 U/L — ABNORMAL HIGH (ref 0–44)
AST: 45 U/L — ABNORMAL HIGH (ref 15–41)
Albumin: 2.4 g/dL — ABNORMAL LOW (ref 3.5–5.0)
Alkaline Phosphatase: 262 U/L — ABNORMAL HIGH (ref 38–126)
Anion gap: 13 (ref 5–15)
BUN: 16 mg/dL (ref 8–23)
CO2: 25 mmol/L (ref 22–32)
Calcium: 9.3 mg/dL (ref 8.9–10.3)
Chloride: 100 mmol/L (ref 98–111)
Creatinine, Ser: 0.86 mg/dL (ref 0.44–1.00)
GFR calc Af Amer: >60 mL/min (ref >60)
GFR calc non Af Amer: >60 mL/min (ref >60)
Glucose, Bld: 117 mg/dL — ABNORMAL HIGH (ref 70–99)
Potassium: 4 mmol/L (ref 3.5–5.1)
Sodium: 138 mmol/L (ref 135–145)
Total Bilirubin: 1.2 mg/dL (ref 0.3–1.2)
Total Protein: 6.2 g/dL — ABNORMAL LOW (ref 6.5–8.1)

## 2018-08-14 LAB — TROPONIN I
Troponin I: <0.03 ng/mL (ref <0.03)
Troponin I: <0.03 ng/mL (ref <0.03)

## 2018-08-14 LAB — SARS CORONAVIRUS 2 BY RT PCR (HOSPITAL ORDER, PERFORMED IN ~~LOC~~ HOSPITAL LAB): SARS Coronavirus 2: NEGATIVE

## 2018-08-14 MED ORDER — IOHEXOL 350 MG/ML SOLN
100.0000 mL | Freq: Once | INTRAVENOUS | Status: AC | PRN
  Administered 2018-08-14: 100 mL via INTRAVENOUS

## 2018-08-14 MED ORDER — HYDROMORPHONE HCL 1 MG/ML IJ SOLN
1.0000 mg | Freq: Once | INTRAMUSCULAR | Status: AC
  Administered 2018-08-14: 1 mg via INTRAVENOUS
  Filled 2018-08-14: qty 1

## 2018-08-14 MED ORDER — MORPHINE SULFATE (PF) 4 MG/ML IV SOLN
4.0000 mg | Freq: Once | INTRAVENOUS | Status: AC
  Administered 2018-08-14: 4 mg via INTRAVENOUS
  Filled 2018-08-14: qty 1

## 2018-08-14 MED ORDER — SODIUM CHLORIDE 0.9 % IV BOLUS (SEPSIS)
500.0000 mL | Freq: Once | INTRAVENOUS | Status: AC
  Administered 2018-08-14: 500 mL via INTRAVENOUS

## 2018-08-14 MED ORDER — ONDANSETRON HCL 4 MG/2ML IJ SOLN
4.0000 mg | Freq: Once | INTRAMUSCULAR | Status: AC
  Administered 2018-08-14: 19:00:00 4 mg via INTRAVENOUS
  Filled 2018-08-14: qty 2

## 2018-08-14 MED ORDER — METOCLOPRAMIDE HCL 5 MG/ML IJ SOLN
10.0000 mg | Freq: Once | INTRAMUSCULAR | Status: AC
  Administered 2018-08-14: 10 mg via INTRAVENOUS
  Filled 2018-08-14: qty 2

## 2018-08-14 NOTE — ED Provider Notes (Addendum)
[place] EMERGENCY DEPARTMENT Provider Note   CSN: [id number] Arrival date & time: 08/14/18  1551    History   Chief Complaint Chief Complaint  Patient presents with  . Chest Pain    HPI Lisa Villanueva is a 73 y.o. female.     Patient is a 73 year old female with past medical history of CHF, AICD placement, breast cancer.  She presents today for evaluation of chest discomfort.  This started earlier this afternoon.  She describes it as a pressure and felt short of breath.  She denies nausea, diaphoresis, or radiation.  Patient is currently recovering from total hip replacement performed at [facility] long last week.  Patient is taking Eliquis.  Patient's pain was initially rated as a 10 out of 10, however has since improved and is now rated as a 2 out of 10.  She was brought by EMS.  The history is provided by the patient.  Chest Pain  Pain location:  Substernal area Pain quality: pressure   Pain radiates to:  Does not radiate Pain severity:  Moderate Duration:  2 hours Timing:  Constant Progression:  Improving Chronicity:  New Relieved by:  Nothing Worsened by:  Nothing   Past Medical History:  Diagnosis Date  . AICD (automatic cardioverter/defibrillator) present   . Cancer ([place])    Metastic Breast Cancer  . Cardiomyopathy ([place])   . CHF (congestive heart failure) ([place])   . Complication of anesthesia   . Macular degeneration   . Pneumonia   . PONV (postoperative nausea and vomiting)     Patient Active Problem List   Diagnosis Date Noted  . 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 JOINT REPLACEMENT Left 08/2015   Hip  . TONSILLECTOMY    . TOTAL HIP ARTHROPLASTY Right 08/08/2018   Procedure: TOTAL HIP ARTHROPLASTY ANTERIOR APPROACH;  Surgeon: Rod Can, MD;  Location: [facility] ORS;  Service: Orthopedics;  Laterality: Right;     OB History   No obstetric history on file.       Home Medications    Prior to Admission medications   Medication Sig Start Date End Date Taking? Authorizing Provider  anastrozole (ARIMIDEX) 1 MG tablet Take 1 mg by mouth daily.    [provider]  apixaban (ELIQUIS) 2.5 MG TABS tablet Take 1 tablet (2.5 mg total) by mouth every 12 (twelve) hours. 08/09/18   Swinteck, Aaron Edelman, MD  aspirin EC 81 MG tablet Take 81 mg by mouth daily.    [provider]  carvedilol (COREG) 12.5 MG tablet Take 12.5 mg by mouth 2 (two) times a day.    [provider]  Cholecalciferol (VITAMIN D3) 125 MCG (5000 UT) CAPS Take 5,000 Units by mouth daily.    [provider]  Coenzyme Q10 (COQ-10) 200 MG CAPS Take 200 mg by mouth daily.    [provider]  diphenhydramine-acetaminophen (TYLENOL PM) 25-500 MG TABS tablet Take 2 tablets by mouth at bedtime.    [provider]  docusate sodium (COLACE) 100 MG capsule Take 1 capsule (100 mg total) by mouth 2 (two) times daily. 08/09/18   Swinteck, Aaron Edelman, MD  furosemide (LASIX) 20 MG tablet Take 20 mg by mouth daily.    [provider]  HYDROcodone-acetaminophen (NORCO/VICODIN) 5-325 MG tablet Take 1 tablet by mouth every 4 (four) hours as needed for moderate pain (pain score 4-6). 08/09/18   Rod Can, MD  Multiple Vitamins-Minerals (PRESERVISION AREDS 2) CAPS Take 1 capsule by mouth 2 (two) times a day.    [provider]  Omega-3 Fatty Acids (FISH OIL) 1200 MG CAPS Take 1,200 mg by mouth daily.    [provider]  ondansetron (ZOFRAN) 4 MG tablet Take 1 tablet (4 mg total) by mouth every 6 (six) hours as needed for nausea (use 1st). 08/09/18   Swinteck, Aaron Edelman, MD  Polyvinyl Alcohol-Povidone (REFRESH OP) Place 1 drop into both eyes 2 (two) times a day.    [provider]  senna (SENOKOT) 8.6 MG TABS tablet Take 2 tablets (17.2 mg total) by mouth at bedtime. 08/09/18   Swinteck, Aaron Edelman, MD  spironolactone (ALDACTONE) 25 MG tablet  Take 25 mg by mouth daily.    [provider]  vitamin C (ASCORBIC ACID) 500 MG tablet Take 500 mg by mouth daily.    [provider]    Family History No family history on file.  Social History Social History   Tobacco Use  . Smoking status: Never Smoker  . Smokeless tobacco: Never Used  Substance Use Topics  . Alcohol use: Not Currently  . Drug use: Never     Allergies   Patient has no known allergies.   Review of Systems Review of Systems  Cardiovascular: Positive for chest pain.  All other systems reviewed and are negative.    Physical Exam Updated Vital Signs BP 130/65   Pulse 99   Temp 97.9 F (36.6 C) (Oral)   Resp 19   Ht 5\' 6"  (1.676 m)   Wt 73 kg   SpO2 93%   BMI 25.98 kg/m   Physical Exam Vitals signs and nursing note reviewed.  Constitutional:      General: She is not in acute distress.    Appearance: She is well-developed. She is not diaphoretic.  HENT:     Head: Normocephalic and atraumatic.  Neck:     Musculoskeletal: Normal range of motion and neck supple.  Cardiovascular:     Rate and Rhythm: Normal rate and regular rhythm.     Heart sounds: No murmur. No friction rub. No gallop.   Pulmonary:     Effort: Pulmonary effort is normal. No respiratory distress.     Breath sounds: Normal breath sounds. No wheezing.  Abdominal:     General: Bowel sounds are normal. There is no distension.     Palpations: Abdomen is soft.     Tenderness: There is no abdominal tenderness.  Musculoskeletal: Normal range of motion.     Right lower leg: She exhibits no tenderness.     Left lower leg: She exhibits no tenderness.     Comments: There is edema of the right lower extremity.  Skin:    General: Skin is warm and dry.  Neurological:     Mental Status: She is alert and oriented to person, place, and time.      ED Treatments / Results  Labs (all labs ordered are listed, but only abnormal results are displayed) Labs Reviewed   SARS CORONAVIRUS 2 (HOSPITAL ORDER, [facility])  COMPREHENSIVE METABOLIC PANEL  TROPONIN I  CBC WITH DIFFERENTIAL/PLATELET    EKG EKG Interpretation  Date/Time:  Wednesday Aug 14 2018 15:58:23 EDT Ventricular Rate:  105 PR Interval:    QRS Duration: 125 QT Interval:  388 QTC Calculation: 513 R Axis:   -179 Text Interpretation:  Atrial-sensed ventricular-paced rhythm No further analysis attempted due to paced rhythm  Confirmed by Veryl Speak [phone]) on 08/14/2018 4:11:28 PM   Radiology No results found.  Procedures Procedures (including critical care time)  Medications Ordered in ED Medications - No data to display   Initial Impression / Assessment and Plan / ED Course  I have reviewed the triage vital signs and the nursing notes.  Pertinent labs & imaging results that were available during my care of the patient were reviewed by me and considered in my medical decision making (see chart for details).  Clinical Course as of Aug 18 708  Wed Aug 14, 2018  2012 Patient continued to have vomiting after Zofran.  At this time will order a half a liter bolus of fluids since she is tachycardic to 114.  We will also order her some Reglan.  I do not want to fluid overload her since she does have a history of CHF.  Pain is better controlled with the Dilaudid.  Still pending CT of the chest.   [KM]  2047 CTA revealing advanced metastatic cancer in the sternum, ribs and vertebrae.  I believe that this is with causing the patient's pain.  She has had negative troponins x2.  She remains tachycardic to 114 and in pain with nausea and vomiting.  Will consult the hospitalist for admission.   [KM]  2120 Hospitalist will admit the patient for observation for intractable pain, nausea vomiting.   [KM]    Clinical Course User Index [KM] Alveria Apley, PA-C   Patient presents here with complaints of chest pain.  This started earlier this afternoon.  Patient is  one-week status post total hip replacement at [facility] long.  Her work-up today reveals negative troponin x2.  Her EKG shows a paced rhythm but no significant changes.  Concern for PE is present, however this patient is taking Eliquis.  Patient will undergo a CT scan of the chest to rule out PE/dissection.  She will likely require admission afterward as she has required several doses of pain and nausea medication to control her symptoms.  Final Clinical Impressions(s) / ED Diagnoses   Final diagnoses:  None    ED Discharge Orders    None       Veryl Speak, MD 08/14/18 2376    Veryl Speak, MD 08/19/18 [phone]

## 2018-08-14 NOTE — ED Notes (Signed)
Nurse Navigator communication: Husband [name] provided with an update about pts care. He appreciates being contacted and wishes to be updated if the patients is going to be admitted vs. Discharged.

## 2018-08-14 NOTE — ED Provider Notes (Addendum)
  Physical Exam  BP (!) 102/59 (BP Location: Left Arm)   Pulse (!) 105   Temp 98.5 F (36.9 C) (Oral)   Resp 18   Ht 5\' 6"  (1.676 m)   Wt 76.6 kg   SpO2 94%   BMI 27.26 kg/m   Physical Exam Constitutional:      Appearance: She is well-developed.     Comments: Patient is actively vomiting and appears uncomfortable  HENT:     Head: Normocephalic.  Cardiovascular:     Rate and Rhythm: Tachycardia present.  Pulmonary:     Effort: Tachypnea present.  Neurological:     Mental Status: She is alert.  Psychiatric:        Mood and Affect: Mood normal.     ED Course/Procedures   Clinical Course as of Aug 14 1704  Wed Aug 14, 2018  2012 Patient continued to have vomiting after Zofran.  At this time will order a half a liter bolus of fluids since she is tachycardic to 114.  We will also order her some Reglan.  I do not want to fluid overload her since she does have a history of CHF.  Pain is better controlled with the Dilaudid.  Still pending CT of the chest.   [KM]  2047 CTA revealing advanced metastatic cancer in the sternum, ribs and vertebrae.  I believe that this is with causing the patient's pain.  She has had negative troponins x2.  She remains tachycardic to 114 and in pain with nausea and vomiting.  Will consult the hospitalist for admission.   [KM]  2120 Hospitalist will admit the patient for observation for intractable pain, nausea vomiting.   [KM]    Clinical Course User Index [KM] Alveria Apley, PA-C    Procedures  MDM  Patient signed out to me by Dr. Stark Jock due to change of shift.  73 year old female with CHF, defibrillator, breast cancer and s/p hip replacement 1 week ago. Here for cp this afternoon radiating to the back.  Acute onset and constant.  On eloquis. Working  up for chest pain. Pain not reduced with two 8mg  morphine. Chest xray with ?opacity and initial trop negative. CTA pending.  Patient began to have nausea and vomiting.  Was given Zofran and Dilaudid.   Dilaudid significantly helped the patient's pain.      Alveria Apley, PA-C 08/14/18 1949    Alveria Apley, PA-C 08/15/18 1706    Veryl Speak, MD 08/19/18 [phone]

## 2018-08-14 NOTE — ED Triage Notes (Signed)
Pt c/o mid chest pain radiating to the back that began 2 hours ago along with some sob ; pt denies any coughing or fevers ; pt states she has breast cancer that has metallized  To different parts of her body ; pt on currently on chemo pills ; pt also states that she recently had right hip surgery ; pt alert and oriented x4 and following simple commands

## 2018-08-14 NOTE — ED Notes (Signed)
Patient transported to CT 

## 2018-08-14 NOTE — H&P (Addendum)
TRH H&P    Patient Demographics:    Lisa Villanueva, is a 73 y.o. female  MRN: [id number]  DOB - 02-13-1946  Admit Date - 08/14/2018  Referring MD/NP/PA:  Madilyn Hook  Outpatient Primary MD for the patient is Patient, No Pcp Per  Patient coming from:   home  Chief complaint- chest pain   HPI:    Lisa Villanueva  is a 73 y.o. female,w metastatic breast cancer (ribs), CHF s/p AICD, CHF, macular degeneration , apparently presents with c/o of chest tightness radiating to the back starting this afternoon and lasting until treated with morphine/ dilaudid in ER.  Pt denies fever, chills, cough, covid contact, palp, sob, abd pain, diarrhea, brbpr.  Pt has some nausea and vomitting after receiving pain medication in the ER.   In ED,  T 98.5, P 110, R 20, BP 134/71  Pox 96% on RA Wt 73 kg  CTA chest IMPRESSION: 1. No pulmonary embolus or acute aortic syndrome. 2. Bilateral intermediate sized pleural effusions. 3. Extensive osseous metastatic disease within the sternum, vertebral column and ribs. No pathologic fracture.  EKG paced at 105, no prior for comparison  Trop <0.03 COVID negative  Na 138, K 4.0, Bun 16, Creatinine 0.86 Ast 45, Alt 58 Alk phos 262, T. Bili 1.2  Wbc 16.2, Hgb 10.4, Plt 379  Pt tx with morphine '4mg'$  iv x2, and dilaudid '1mg'$  iv x1 along with zofran '4mg'$  iv x1 in ED with relief of chest pain  Pt will be admitted for chest pain evaluation.           Review of systems:    In addition to the HPI above,  No Fever-chills, No Headache, No changes with Vision or hearing, No problems swallowing food or Liquids, No Chest pain, Cough or Shortness of Breath, No Abdominal pain, No Nausea or Vomiting, bowel movements are regular, No Blood in stool or Urine, No dysuria, No new skin rashes or bruises, No new joints pains-aches,  No new weakness, tingling, numbness in any extremity, No  recent weight gain or loss, No polyuria, polydypsia or polyphagia, No significant Mental Stressors.  All other systems reviewed and are negative.    Past History of the following :    Past Medical History:  Diagnosis Date  . AICD (automatic cardioverter/defibrillator) present   . Cancer ([place])    Metastic Breast Cancer  . Cardiomyopathy ([place])   . CHF (congestive heart failure) ([place])   . Complication of anesthesia   . Macular degeneration   . Pneumonia   . PONV (postoperative nausea and vomiting)       Past Surgical History:  Procedure Laterality Date  . JOINT REPLACEMENT Left 08/2015   Hip  . TONSILLECTOMY    . TOTAL HIP ARTHROPLASTY Right 08/08/2018   Procedure: TOTAL HIP ARTHROPLASTY ANTERIOR APPROACH;  Surgeon: Rod Can, MD;  Location: [facility];  Service: Orthopedics;  Laterality: Right;      Social History:      Social History   Tobacco Use  . Smoking status: Never  Smoker  . Smokeless tobacco: Never Used  Substance Use Topics  . Alcohol use: Not Currently       Family History :    History reviewed. No pertinent family history.  obeisity   Home Medications:   Prior to Admission medications   Medication Sig Start Date End Date Taking? Authorizing Provider  anastrozole (ARIMIDEX) 1 MG tablet Take 1 mg by mouth daily.   Yes [provider]  apixaban (ELIQUIS) 2.5 MG TABS tablet Take 1 tablet (2.5 mg total) by mouth every 12 (twelve) hours. 08/09/18  Yes Swinteck, Aaron Edelman, MD  aspirin EC 81 MG tablet Take 81 mg by mouth daily.   Yes [provider]  carvedilol (COREG) 12.5 MG tablet Take 12.5 mg by mouth 2 (two) times a day.   Yes [provider]  Cholecalciferol (VITAMIN D3) 125 MCG (5000 UT) CAPS Take 5,000 Units by mouth daily.   Yes [provider]  Coenzyme Q10 (COQ-10) 200 MG CAPS Take 200 mg by mouth daily.   Yes [provider]  diphenhydramine-acetaminophen (TYLENOL PM) 25-500 MG TABS tablet Take 2  tablets by mouth at bedtime as needed (for sleep).    Yes [provider]  docusate sodium (COLACE) 100 MG capsule Take 1 capsule (100 mg total) by mouth 2 (two) times daily. 08/09/18  Yes Swinteck, Aaron Edelman, MD  furosemide (LASIX) 20 MG tablet Take 20 mg by mouth daily.   Yes [provider]  HYDROcodone-acetaminophen (NORCO/VICODIN) 5-325 MG tablet Take 1 tablet by mouth every 4 (four) hours as needed for moderate pain (pain score 4-6). 08/09/18  Yes Swinteck, Aaron Edelman, MD  Multiple Vitamins-Minerals (PRESERVISION AREDS 2) CAPS Take 1 capsule by mouth 2 (two) times a day.   Yes [provider]  Omega-3 Fatty Acids (FISH OIL) 1200 MG CAPS Take 1,200 mg by mouth daily.   Yes [provider]  ondansetron (ZOFRAN) 4 MG tablet Take 1 tablet (4 mg total) by mouth every 6 (six) hours as needed for nausea (use 1st). 08/09/18  Yes Swinteck, Aaron Edelman, MD  Polyvinyl Alcohol-Povidone (REFRESH OP) Place 1 drop into both eyes 2 (two) times daily as needed.    Yes [provider]  senna (SENOKOT) 8.6 MG TABS tablet Take 2 tablets (17.2 mg total) by mouth at bedtime. 08/09/18  Yes Swinteck, Aaron Edelman, MD  spironolactone (ALDACTONE) 25 MG tablet Take 25 mg by mouth 2 (two) times daily.    Yes [provider]  vitamin C (ASCORBIC ACID) 500 MG tablet Take 500 mg by mouth daily.   Yes [provider]     Allergies:    No Known Allergies   Physical Exam:   Vitals  Blood pressure 134/71, pulse (!) 110, temperature 98.5 F (36.9 C), temperature source Oral, resp. rate 20, height '5\' 6"'$  (1.676 m), weight 73 kg, SpO2 96 %.  1.  General: axox3  2. Psychiatric: euthymic  3. Neurologic: cn2-12 intact, reflexes 2+ symmetric, diffuse with no clonus Motor 5/5 in all 4 ext  4. HEENMT:  Anicteric, pupils 1.53m symmetric, direct, consensual, near intact Neck: no jvd  5. Respiratory : CTAB  6. Cardiovascular : Slight pain with palpation of chest, rrr, s1, s2, no  m/g/r  7. Gastrointestinal:  Abd: soft, nt, nd, +bs  8. Skin:  Ext: no c/c/e, no rash  9.Musculoskeletal:  Good ROM,  No adenopathy     Data Review:    CBC Recent Labs  Lab 08/09/18 0327 08/14/18 1700  WBC 12.6*  16.2*  HGB 9.4* 10.4*  HCT 30.8* 32.7*  PLT 239 379  MCV 96.6 93.7  MCH 29.5 29.8  MCHC 30.5 31.8  RDW 15.0 15.5  LYMPHSABS  --  1.2  MONOABS  --  1.6*  EOSABS  --  0.1  BASOSABS  --  0.0   ------------------------------------------------------------------------------------------------------------------  Results for orders placed or performed during the hospital encounter of 08/14/18 (from the past 48 hour(s))  Comprehensive metabolic panel     Status: Abnormal   Collection Time: 08/14/18  5:00 PM  Result Value Ref Range   Sodium 138 135 - 145 mmol/L   Potassium 4.0 3.5 - 5.1 mmol/L   Chloride 100 98 - 111 mmol/L   CO2 25 22 - 32 mmol/L   Glucose, Bld 117 (H) 70 - 99 mg/dL   BUN 16 8 - 23 mg/dL   Creatinine, Ser 0.86 0.44 - 1.00 mg/dL   Calcium 9.3 8.9 - 10.3 mg/dL   Total Protein 6.2 (L) 6.5 - 8.1 g/dL   Albumin 2.4 (L) 3.5 - 5.0 g/dL   AST 45 (H) 15 - 41 U/L   ALT 58 (H) 0 - 44 U/L   Alkaline Phosphatase 262 (H) 38 - 126 U/L   Total Bilirubin 1.2 0.3 - 1.2 mg/dL   GFR calc non Af Amer >60 >60 mL/min   GFR calc Af Amer >60 >60 mL/min   Anion gap 13 5 - 15    Comment: Performed at [facility], [address] [place], [place] [address]  Troponin I - Once     Status: None   Collection Time: 08/14/18  5:00 PM  Result Value Ref Range   Troponin I <0.03 <0.03 ng/mL    Comment: Performed at [facility] [address], [place], [place] [address]  CBC with Differential     Status: Abnormal   Collection Time: 08/14/18  5:00 PM  Result Value Ref Range   WBC 16.2 (H) 4.0 - 10.5 K/uL   RBC 3.49 (L) 3.87 - 5.11 MIL/uL   Hemoglobin 10.4 (L) 12.0 - 15.0 g/dL   HCT 32.7 (L) 36.0 - 46.0 %   MCV 93.7 80.0 - 100.0 fL   MCH 29.8 26.0 - 34.0  pg   MCHC 31.8 30.0 - 36.0 g/dL   RDW 15.5 11.5 - 15.5 %   Platelets 379 150 - 400 K/uL   nRBC 0.1 0.0 - 0.2 %   Neutrophils Relative % 81 %   Neutro Abs 13.1 (H) 1.7 - 7.7 K/uL   Lymphocytes Relative 7 %   Lymphs Abs 1.2 0.7 - 4.0 K/uL   Monocytes Relative 10 %   Monocytes Absolute 1.6 (H) 0.1 - 1.0 K/uL   Eosinophils Relative 1 %   Eosinophils Absolute 0.1 0.0 - 0.5 K/uL   Basophils Relative 0 %   Basophils Absolute 0.0 0.0 - 0.1 K/uL   Immature Granulocytes 1 %   Abs Immature Granulocytes 0.14 (H) 0.00 - 0.07 K/uL    Comment: Performed at [facility], [address] [place],  [address]  SARS Coronavirus 2 (CEPHEID- Performed in [facility] hospital lab), Hosp Order     Status: None   Collection Time: 08/14/18  5:07 PM  Result Value Ref Range   SARS Coronavirus 2 NEGATIVE NEGATIVE    Comment: (NOTE) If result is NEGATIVE SARS-CoV-2 target nucleic acids are NOT DETECTED. The SARS-CoV-2 RNA is generally detectable in upper and lower  respiratory specimens  during the acute phase of infection. The lowest  concentration of SARS-CoV-2 viral copies this assay can detect is 250  copies / mL. A negative result does not preclude SARS-CoV-2 infection  and should not be used as the sole basis for treatment or other  patient management decisions.  A negative result may occur with  improper specimen collection / handling, submission of specimen other  than nasopharyngeal swab, presence of viral mutation(s) within the  areas targeted by this assay, and inadequate number of viral copies  (<250 copies / mL). A negative result must be combined with clinical  observations, patient history, and epidemiological information. If result is POSITIVE SARS-CoV-2 target nucleic acids are DETECTED. The SARS-CoV-2 RNA is generally detectable in upper and lower  respiratory specimens dur ing the acute phase of infection.  Positive  results are indicative of active infection with  SARS-CoV-2.  Clinical  correlation with patient history and other diagnostic information is  necessary to determine patient infection status.  Positive results do  not rule out bacterial infection or co-infection with other viruses. If result is PRESUMPTIVE POSTIVE SARS-CoV-2 nucleic acids MAY BE PRESENT.   A presumptive positive result was obtained on the submitted specimen  and confirmed on repeat testing.  While 2019 novel coronavirus  (SARS-CoV-2) nucleic acids may be present in the submitted sample  additional confirmatory testing may be necessary for epidemiological  and / or clinical management purposes  to differentiate between  SARS-CoV-2 and other Sarbecovirus currently known to infect humans.  If clinically indicated additional testing with an alternate test  methodology [phone]) is advised. The SARS-CoV-2 RNA is generally  detectable in upper and lower respiratory sp ecimens during the acute  phase of infection. The expected result is Negative. Fact Sheet for Patients:  StrictlyIdeas.no Fact Sheet for Healthcare Providers: BankingDealers.co.za This test is not yet approved or cleared by the [place] FDA and has been authorized for detection and/or diagnosis of SARS-CoV-2 by FDA under an Emergency Use Authorization (EUA).  This EUA will remain in effect (meaning this test can be used) for the duration of the COVID-19 declaration under Section 564(b)(1) of the Act, 21 U.S.C. section 360bbb-3(b)(1), unless the authorization is terminated or revoked sooner. Performed at [facility], [place] [address], [place], [place] [address]   Troponin I - ONCE - STAT     Status: None   Collection Time: 08/14/18  7:20 PM  Result Value Ref Range   Troponin I <0.03 <0.03 ng/mL    Comment: Performed at [facility], [place] [address] [place], [place] [address]    Chemistries  Recent Labs  Lab 08/09/18 0327 08/14/18 1700  NA  139 138  K 4.5 4.0  CL 111 100  CO2 22 25  GLUCOSE 126* 117*  BUN 21 16  CREATININE 1.14* 0.86  CALCIUM 8.5* 9.3  AST  --  45*  ALT  --  58*  ALKPHOS  --  262*  BILITOT  --  1.2   ------------------------------------------------------------------------------------------------------------------  ------------------------------------------------------------------------------------------------------------------ GFR: Estimated Creatinine Clearance: 62.3 mL/min (by C-G formula based on SCr of 0.86 mg/dL). Liver Function Tests: Recent Labs  Lab 08/14/18 1700  AST 45*  ALT 58*  ALKPHOS 262*  BILITOT 1.2  PROT 6.2*  ALBUMIN 2.4*   No results for input(s): LIPASE, AMYLASE in the last 168 hours. No results for input(s): AMMONIA in the last 168 hours. Coagulation Profile: No results for input(s): INR, PROTIME in the last 168 hours. Cardiac  Enzymes: Recent Labs  Lab 08/14/18 1700 08/14/18 1920  TROPONINI <0.03 <0.03   BNP (last 3 results) No results for input(s): PROBNP in the last 8760 hours. HbA1C: No results for input(s): HGBA1C in the last 72 hours. CBG: No results for input(s): GLUCAP in the last 168 hours. Lipid Profile: No results for input(s): CHOL, HDL, LDLCALC, TRIG, CHOLHDL, LDLDIRECT in the last 72 hours. Thyroid Function Tests: No results for input(s): TSH, T4TOTAL, FREET4, T3FREE, THYROIDAB in the last 72 hours. Anemia Panel: No results for input(s): VITAMINB12, FOLATE, FERRITIN, TIBC, IRON, RETICCTPCT in the last 72 hours.  --------------------------------------------------------------------------------------------------------------- Urine analysis: No results found for: COLORURINE, APPEARANCEUR, LABSPEC, PHURINE, GLUCOSEU, HGBUR, BILIRUBINUR, KETONESUR, PROTEINUR, UROBILINOGEN, NITRITE, LEUKOCYTESUR    Imaging Results:    Dg Chest 1 View  Result Date: 08/14/2018 CLINICAL DATA:  Chest pain EXAM: CHEST  1 VIEW COMPARISON:  None FINDINGS: The patient is  status post placement of a multi lead left-sided pacemaker. The cardiac silhouette is mildly enlarged. There are small bilateral pleural effusions. There is a retrocardiac opacity. No pneumothorax. No acute osseous abnormality. The lungs may be mildly hyperexpanded. IMPRESSION: 1. Small bilateral pleural effusions. 2. Retrocardiac opacity which may represent atelectasis or infiltrate. Electronically Signed   By: Constance Holster M.D.   On: 08/14/2018 16:34   Ct Angio Chest Pe W And/or Wo Contrast  Result Date: 08/14/2018 CLINICAL DATA:  Chest pain. History of breast cancer. Recent hip replacement EXAM: CT ANGIOGRAPHY CHEST WITH CONTRAST TECHNIQUE: Multidetector CT imaging of the chest was performed using the standard protocol during bolus administration of intravenous contrast. Multiplanar CT image reconstructions and MIPs were obtained to evaluate the vascular anatomy. CONTRAST:  150m OMNIPAQUE IOHEXOL 350 MG/ML SOLN COMPARISON:  None. FINDINGS: Cardiovascular: --Pulmonary arteries: Contrast injection is sufficient to demonstrate satisfactory opacification of the pulmonary arteries to the segmental level, with attenuation of 425 HU at the main pulmonary artery. There is no pulmonary embolus. The main pulmonary artery is within normal limits for size. --Aorta: Satisfactory opacification of the thoracic aorta. No aortic dissection or other acute aortic syndrome. Conventional 3 vessel aortic branching pattern. The aortic course and caliber are normal. There is mild aortic atherosclerosis. --Heart: Normal size. No pericardial effusion. Left chest wall AICD leads terminate in the right atrium and right ventricle. Mediastinum/Nodes: No mediastinal, hilar or axillary lymphadenopathy. The visualized thyroid and thoracic esophageal course are unremarkable. Lungs/Pleura: Intermediate sized pleural effusions, slightly larger on the left. No pulmonary nodules or masses. Upper Abdomen: Contrast bolus timing is not  optimized for evaluation of the abdominal organs. Within this limitation, the visualized organs of the upper abdomen are normal. Musculoskeletal: There are flowing enthesophytes along the course of the anterior longitudinal ligament and the posterior aspect of the interspinous ligament. There is extensive osseous metastatic disease throughout the vertebral column, sternum and ribs. Review of the MIP images confirms the above findings. IMPRESSION: 1. No pulmonary embolus or acute aortic syndrome. 2. Bilateral intermediate sized pleural effusions. 3. Extensive osseous metastatic disease within the sternum, vertebral column and ribs. No pathologic fracture. Electronically Signed   By: KUlyses JarredM.D.   On: 08/14/2018 20:15       Assessment & Plan:    Active Problems:   Chest pain   Breast cancer metastasized to bone ([facility]  Chest pain likely due to metastatic disease to ribs Tele Trop I q6h x3 Check lipid Check cardiac echo Cont Aspirin '81mg'$  po qday Start Lipitor '80mg'$  po qhs x1  Cont Carvedilol  as below Consider cardiology consult in AM depending upon patients wishes Her primary cardiologist is in [place] morphine '1mg'$  iv q4h prn  Tachycardia ? Due to pain Tele Check TSH Monitor, discussed case with cardiology fellow, ? Rate responsive pacer , please curbside cardiology in AM or obtain full consult as above  Nausea/ vomitting zofran '4mg'$  iv q6h prn  H/o Pafib Cont Eliquis pharmacy to dose  Cardiomyopathy / h/o CHF (systolic) s/p AICD Cont Lasix '20mg'$  po qday Cont Spironolactone '25mg'$  po bid Cont Carvedilol 12.'5mg'$  po bid  H/o metastatic breast cancer to bone Cont Arimidex '1mg'$  po qday HOLD Norco while on morphine iv prn  Abnormal liver function ? Metastatic breast cancer Please have f/u as outpatient in regards to imaging Check cmp in am  Recent orthopedic surgery Cont Eliquis    DVT Prophylaxis-   Eliquis  AM Labs Ordered, also please review Full Orders  Family  Communication: Admission, patients condition and plan of care including tests being ordered have been discussed with the patient  who indicate understanding and agree with the plan and Code Status.  Code Status:  FULL CODE  Admission status: Observation: Based on patients clinical presentation and evaluation of above clinical data, I have made determination that patient meets observation criteria at this time.  Time spent in minutes : 55 minutes   Jani Gravel M.D on 08/14/2018 at 11:55 PM

## 2018-08-15 ENCOUNTER — Observation Stay (HOSPITAL_COMMUNITY): Payer: Medicare PPO

## 2018-08-15 DIAGNOSIS — D72829 Elevated white blood cell count, unspecified: Secondary | ICD-10-CM | POA: Diagnosis present

## 2018-08-15 DIAGNOSIS — G893 Neoplasm related pain (acute) (chronic): Secondary | ICD-10-CM

## 2018-08-15 DIAGNOSIS — R079 Chest pain, unspecified: Secondary | ICD-10-CM | POA: Diagnosis not present

## 2018-08-15 LAB — CBC
HCT: 31.7 % — ABNORMAL LOW (ref 36.0–46.0)
Hemoglobin: 10.4 g/dL — ABNORMAL LOW (ref 12.0–15.0)
MCH: 30.2 pg (ref 26.0–34.0)
MCHC: 32.8 g/dL (ref 30.0–36.0)
MCV: 92.2 fL (ref 80.0–100.0)
Platelets: 357 10*3/uL (ref 150–400)
RBC: 3.44 MIL/uL — ABNORMAL LOW (ref 3.87–5.11)
RDW: 15.8 % — ABNORMAL HIGH (ref 11.5–15.5)
WBC: 17.3 10*3/uL — ABNORMAL HIGH (ref 4.0–10.5)
nRBC: 0.2 % (ref 0.0–0.2)

## 2018-08-15 LAB — COMPREHENSIVE METABOLIC PANEL
ALT: 51 U/L — ABNORMAL HIGH (ref 0–44)
AST: 37 U/L (ref 15–41)
Albumin: 2.4 g/dL — ABNORMAL LOW (ref 3.5–5.0)
Alkaline Phosphatase: 262 U/L — ABNORMAL HIGH (ref 38–126)
Anion gap: 12 (ref 5–15)
BUN: 16 mg/dL (ref 8–23)
CO2: 23 mmol/L (ref 22–32)
Calcium: 9.3 mg/dL (ref 8.9–10.3)
Chloride: 102 mmol/L (ref 98–111)
Creatinine, Ser: 0.95 mg/dL (ref 0.44–1.00)
GFR calc Af Amer: >60 mL/min (ref >60)
GFR calc non Af Amer: >60 mL/min (ref >60)
Glucose, Bld: 154 mg/dL — ABNORMAL HIGH (ref 70–99)
Potassium: 4.3 mmol/L (ref 3.5–5.1)
Sodium: 137 mmol/L (ref 135–145)
Total Bilirubin: 1.5 mg/dL — ABNORMAL HIGH (ref 0.3–1.2)
Total Protein: 5.9 g/dL — ABNORMAL LOW (ref 6.5–8.1)

## 2018-08-15 LAB — LIPID PANEL
Cholesterol: 168 mg/dL (ref 0–200)
HDL: 33 mg/dL — ABNORMAL LOW (ref >40)
LDL Cholesterol: 114 mg/dL — ABNORMAL HIGH (ref 0–99)
Total CHOL/HDL Ratio: 5.1 RATIO
Triglycerides: 104 mg/dL (ref <150)
VLDL: 21 mg/dL (ref 0–40)

## 2018-08-15 LAB — HIV ANTIBODY (ROUTINE TESTING W REFLEX): HIV Screen 4th Generation wRfx: NONREACTIVE

## 2018-08-15 LAB — TROPONIN I
Troponin I: <0.03 ng/mL (ref <0.03)
Troponin I: <0.03 ng/mL (ref <0.03)

## 2018-08-15 MED ORDER — BISACODYL 10 MG RE SUPP
10.0000 mg | Freq: Once | RECTAL | Status: AC
  Administered 2018-08-15: 01:00:00 10 mg via RECTAL
  Filled 2018-08-15: qty 1

## 2018-08-15 MED ORDER — SPIRONOLACTONE 25 MG PO TABS
25.0000 mg | ORAL_TABLET | Freq: Two times a day (BID) | ORAL | Status: DC
  Administered 2018-08-15: 25 mg via ORAL
  Filled 2018-08-15: qty 1

## 2018-08-15 MED ORDER — DIPHENHYDRAMINE-APAP (SLEEP) 25-500 MG PO TABS: 2.0000 | ORAL_TABLET | Freq: Every evening | ORAL | Status: DC | PRN

## 2018-08-15 MED ORDER — FUROSEMIDE 20 MG PO TABS
20.0000 mg | ORAL_TABLET | Freq: Every day | ORAL | Status: DC
  Administered 2018-08-15: 20 mg via ORAL
  Filled 2018-08-15: qty 1

## 2018-08-15 MED ORDER — PROSIGHT PO TABS
1.0000 | ORAL_TABLET | Freq: Every day | ORAL | Status: DC
  Administered 2018-08-15: 1 via ORAL
  Filled 2018-08-15: qty 1

## 2018-08-15 MED ORDER — POLYVINYL ALCOHOL 1.4 % OP SOLN: 1.0000 [drp] | Freq: Two times a day (BID) | OPHTHALMIC | Status: DC | PRN

## 2018-08-15 MED ORDER — ASPIRIN EC 81 MG PO TBEC
81.0000 mg | DELAYED_RELEASE_TABLET | Freq: Every day | ORAL | Status: DC
  Administered 2018-08-15: 09:00:00 81 mg via ORAL
  Filled 2018-08-15: qty 1

## 2018-08-15 MED ORDER — VITAMIN D 25 MCG (1000 UNIT) PO TABS
5000.0000 [IU] | ORAL_TABLET | Freq: Every day | ORAL | Status: DC
  Administered 2018-08-15: 5000 [IU] via ORAL
  Filled 2018-08-15: qty 5

## 2018-08-15 MED ORDER — DIPHENHYDRAMINE HCL 25 MG PO CAPS: 50.0000 mg | ORAL_CAPSULE | Freq: Every evening | ORAL | Status: DC | PRN

## 2018-08-15 MED ORDER — VITAMIN C 500 MG PO TABS
500.0000 mg | ORAL_TABLET | Freq: Every day | ORAL | Status: DC
  Administered 2018-08-15: 500 mg via ORAL
  Filled 2018-08-15: qty 1

## 2018-08-15 MED ORDER — DOCUSATE SODIUM 100 MG PO CAPS
100.0000 mg | ORAL_CAPSULE | Freq: Two times a day (BID) | ORAL | Status: DC
  Administered 2018-08-15: 100 mg via ORAL
  Filled 2018-08-15: qty 1

## 2018-08-15 MED ORDER — OMEGA-3-ACID ETHYL ESTERS 1 G PO CAPS
1.0000 g | ORAL_CAPSULE | Freq: Two times a day (BID) | ORAL | Status: DC
  Administered 2018-08-15 (×2): 1 g via ORAL
  Filled 2018-08-15 (×2): qty 1

## 2018-08-15 MED ORDER — COQ-10 200 MG PO CAPS: 200.0000 mg | ORAL_CAPSULE | Freq: Every day | ORAL | Status: DC

## 2018-08-15 MED ORDER — ANASTROZOLE 1 MG PO TABS
1.0000 mg | ORAL_TABLET | Freq: Every day | ORAL | Status: DC
  Administered 2018-08-15: 1 mg via ORAL
  Filled 2018-08-15: qty 1

## 2018-08-15 MED ORDER — HYDROCODONE-ACETAMINOPHEN 5-325 MG PO TABS: 1.0000 | ORAL_TABLET | ORAL | 0 refills | Status: AC | PRN

## 2018-08-15 MED ORDER — SENNA 8.6 MG PO TABS
2.0000 | ORAL_TABLET | Freq: Every day | ORAL | Status: DC
  Administered 2018-08-15: 17.2 mg via ORAL
  Filled 2018-08-15: qty 2

## 2018-08-15 MED ORDER — POLYETHYLENE GLYCOL 3350 17 G PO PACK: 17.0000 g | PACK | Freq: Every day | ORAL | Status: DC | PRN

## 2018-08-15 MED ORDER — ONDANSETRON HCL 4 MG/2ML IJ SOLN
4.0000 mg | Freq: Four times a day (QID) | INTRAMUSCULAR | Status: DC | PRN
  Administered 2018-08-15: 4 mg via INTRAVENOUS
  Filled 2018-08-15: qty 2

## 2018-08-15 MED ORDER — ATORVASTATIN CALCIUM 80 MG PO TABS: 80.0000 mg | ORAL_TABLET | Freq: Every day | ORAL | Status: DC

## 2018-08-15 MED ORDER — MORPHINE SULFATE (PF) 2 MG/ML IV SOLN: 1.0000 mg | INTRAVENOUS | Status: DC | PRN

## 2018-08-15 MED ORDER — APIXABAN 2.5 MG PO TABS
2.5000 mg | ORAL_TABLET | Freq: Two times a day (BID) | ORAL | Status: DC
  Administered 2018-08-15 (×2): 2.5 mg via ORAL
  Filled 2018-08-15 (×2): qty 1

## 2018-08-15 MED ORDER — CARVEDILOL 6.25 MG PO TABS
6.2500 mg | ORAL_TABLET | Freq: Once | ORAL | Status: AC
  Administered 2018-08-15: 6.25 mg via ORAL
  Filled 2018-08-15: qty 1

## 2018-08-15 MED ORDER — CARVEDILOL 12.5 MG PO TABS
12.5000 mg | ORAL_TABLET | Freq: Two times a day (BID) | ORAL | Status: DC
  Administered 2018-08-15: 12.5 mg via ORAL
  Filled 2018-08-15: qty 1

## 2018-08-15 MED ORDER — ACETAMINOPHEN 500 MG PO TABS: 1000.0000 mg | ORAL_TABLET | Freq: Every evening | ORAL | Status: DC | PRN

## 2018-08-15 NOTE — Discharge Summary (Signed)
Physician Discharge Summary  Lisa Villanueva QVZ:[id number] DOB: 1945/08/30 DOA: 08/14/2018  PCP: Patient, No Pcp Per  Admit date: 08/14/2018 Discharge date: 08/15/2018  Time spent: 45 minutes  Recommendations for Outpatient Follow-up:  1. Follow up with primary oncologist in [place] in 2-3 days for evaluation of pain/mets to rib and sternum, pleural effusion and leukocytosis and elevated LFT 2. Follow up with Dr Delfino Lovett as currently scheduled   Discharge Diagnoses:  Principal Problem:   Chest pain Active Problems:   Breast cancer metastasized to bone [facility])   Avascular necrosis of hip, right (HCC)   Leukocytosis   Discharge Condition: stable  Diet recommendation: heart healthy  Filed Weights   08/14/18 1556 08/15/18 0042  Weight: 73 kg 76.6 kg    History of present illness:   Lisa Villanueva  is a 73 y.o. female,w metastatic breast cancer (ribs), CHF s/p AICD, CHF, macular degeneration presented 08/14/18 with c/o of chest tightness radiating to the back starting that afternoon and lasting until treated with morphine/ dilaudid in ER.  Pt denied fever, chills, cough, covid contact, palp, sob, abd pain, diarrhea, brbpr.  Pt had some nausea and vomitting after receiving pain medication in the ER.   Hospital Course:  Chest pain likely due to metastatic disease to ribs and sternum. No events on tele. Troponin negative x3. Lipid panel with HDL 33 and LDL 114 otherwise within limits of normal. Cont Aspirin 81mg  po qday. CT negative for PE, bilateral intermediate sized pleural effusions and extensive osseous metastatic disease within the sternum, vertebral column and ribs. No fracture. No pericardial effusion.  Suspect this is source of pain. Recommend follow up with primary oncologist in [place]   Tachycardia ? Due to pain. Hr 104. No events on tele. CT without PE. No hypoxia. OP follow up  Nausea/ vomitting. Resolved at discharge  H/o Pafib Cont Eliquis  Cardiomyopathy / h/o CHF  (systolic) s/p AICD. Does not appear overloaded. Continue home meds  H/o metastatic breast cancer to bone Cont Arimidex 1mg  po qday See #1.   Abnormal liver function OP follow up Recent orthopedic surgery Cont Eliquis   Procedures: Consultations:    Discharge Exam: Vitals:   08/14/18 2329 08/15/18 0042  BP: 134/71 111/73  Pulse: (!) 110 (!) 107  Resp: 20 18  Temp: 98.5 F (36.9 C) 99.1 F (37.3 C)  SpO2: 96% 93%    General: awake alert oriented x3  Cardiovascular: RRR no mgr no LE edema Respiratory: normal effort BS clear bilaterally no wheeze  Discharge Instructions   Discharge Instructions    Diet - low sodium heart healthy   Complete by:  As directed    Discharge instructions   Complete by:  As directed    Take medications as prescribed Follow up with oncology within 1 week for evaluation of pain/mets to ribs and sternum and pleural effusion   Increase activity slowly   Complete by:  As directed      Allergies as of 08/15/2018   No Known Allergies     Medication List    TAKE these medications   anastrozole 1 MG tablet Commonly known as:  ARIMIDEX Take 1 mg by mouth daily.   apixaban 2.5 MG Tabs tablet Commonly known as:  ELIQUIS Take 1 tablet (2.5 mg total) by mouth every 12 (twelve) hours.   aspirin EC 81 MG tablet Take 81 mg by mouth daily.   carvedilol 12.5 MG tablet Commonly known as:  COREG Take 12.5 mg by mouth 2 (  two) times a day.   CoQ-10 200 MG Caps Take 200 mg by mouth daily.   diphenhydramine-acetaminophen 25-500 MG Tabs tablet Commonly known as:  TYLENOL PM Take 2 tablets by mouth at bedtime as needed (for sleep).   docusate sodium 100 MG capsule Commonly known as:  COLACE Take 1 capsule (100 mg total) by mouth 2 (two) times daily.   Fish Oil 1200 MG Caps Take 1,200 mg by mouth daily.   furosemide 20 MG tablet Commonly known as:  LASIX Take 20 mg by mouth daily.   HYDROcodone-acetaminophen 5-325 MG  tablet Commonly known as:  NORCO/VICODIN Take 1 tablet by mouth every 4 (four) hours as needed for moderate pain (pain score 4-6).   ondansetron 4 MG tablet Commonly known as:  ZOFRAN Take 1 tablet (4 mg total) by mouth every 6 (six) hours as needed for nausea (use 1st).   PreserVision AREDS 2 Caps Take 1 capsule by mouth 2 (two) times a day.   REFRESH OP Place 1 drop into both eyes 2 (two) times daily as needed.   senna 8.6 MG Tabs tablet Commonly known as:  SENOKOT Take 2 tablets (17.2 mg total) by mouth at bedtime.   spironolactone 25 MG tablet Commonly known as:  ALDACTONE Take 25 mg by mouth 2 (two) times daily.   vitamin C 500 MG tablet Commonly known as:  ASCORBIC ACID Take 500 mg by mouth daily.   Vitamin D3 125 MCG (5000 UT) Caps Take 5,000 Units by mouth daily.      No Known Allergies    The results of significant diagnostics from this hospitalization (including imaging, microbiology, ancillary and laboratory) are listed below for reference.    Significant Diagnostic Studies: Dg Chest 1 View  Result Date: 08/14/2018 CLINICAL DATA:  Chest pain EXAM: CHEST  1 VIEW COMPARISON:  None FINDINGS: The patient is status post placement of a multi lead left-sided pacemaker. The cardiac silhouette is mildly enlarged. There are small bilateral pleural effusions. There is a retrocardiac opacity. No pneumothorax. No acute osseous abnormality. The lungs may be mildly hyperexpanded. IMPRESSION: 1. Small bilateral pleural effusions. 2. Retrocardiac opacity which may represent atelectasis or infiltrate. Electronically Signed   By: Constance Holster M.D.   On: 08/14/2018 16:34   Ct Angio Chest Pe W And/or Wo Contrast  Result Date: 08/14/2018 CLINICAL DATA:  Chest pain. History of breast cancer. Recent hip replacement EXAM: CT ANGIOGRAPHY CHEST WITH CONTRAST TECHNIQUE: Multidetector CT imaging of the chest was performed using the standard protocol during bolus administration of  intravenous contrast. Multiplanar CT image reconstructions and MIPs were obtained to evaluate the vascular anatomy. CONTRAST:  159mL OMNIPAQUE IOHEXOL 350 MG/ML SOLN COMPARISON:  None. FINDINGS: Cardiovascular: --Pulmonary arteries: Contrast injection is sufficient to demonstrate satisfactory opacification of the pulmonary arteries to the segmental level, with attenuation of 425 HU at the main pulmonary artery. There is no pulmonary embolus. The main pulmonary artery is within normal limits for size. --Aorta: Satisfactory opacification of the thoracic aorta. No aortic dissection or other acute aortic syndrome. Conventional 3 vessel aortic branching pattern. The aortic course and caliber are normal. There is mild aortic atherosclerosis. --Heart: Normal size. No pericardial effusion. Left chest wall AICD leads terminate in the right atrium and right ventricle. Mediastinum/Nodes: No mediastinal, hilar or axillary lymphadenopathy. The visualized thyroid and thoracic esophageal course are unremarkable. Lungs/Pleura: Intermediate sized pleural effusions, slightly larger on the left. No pulmonary nodules or masses. Upper Abdomen: Contrast bolus timing is not optimized  for evaluation of the abdominal organs. Within this limitation, the visualized organs of the upper abdomen are normal. Musculoskeletal: There are flowing enthesophytes along the course of the anterior longitudinal ligament and the posterior aspect of the interspinous ligament. There is extensive osseous metastatic disease throughout the vertebral column, sternum and ribs. Review of the MIP images confirms the above findings. IMPRESSION: 1. No pulmonary embolus or acute aortic syndrome. 2. Bilateral intermediate sized pleural effusions. 3. Extensive osseous metastatic disease within the sternum, vertebral column and ribs. No pathologic fracture. Electronically Signed   By: Ulyses Jarred M.D.   On: 08/14/2018 20:15   Dg Pelvis Portable  Result Date:  08/08/2018 CLINICAL DATA:  Postop right hip replacement EXAM: PORTABLE PELVIS 1-2 VIEWS COMPARISON:  Earlier same day FINDINGS: Recent total hip arthroplasty on the right has a good appearance. Previous left hip arthroplasty also has a good appearance. IMPRESSION: Addition of right hip arthroplasty today. Good radiographic appearance. Electronically Signed   By: Nelson Chimes M.D.   On: 08/08/2018 11:38   Dg C-arm 1-60 Min-no Report  Result Date: 08/08/2018 Fluoroscopy was utilized by the requesting physician.  No radiographic interpretation.   Dg Hip Operative Unilat With Pelvis Right  Result Date: 08/08/2018 CLINICAL DATA:  Right hip replacement EXAM: OPERATIVE RIGHT HIP WITH PELVIS COMPARISON:  None. FLUOROSCOPY TIME:  Radiation Exposure Index (as provided by the fluoroscopic device): 2.1 mGy If the device does not provide the exposure index: Fluoroscopy Time:  19 seconds Number of Acquired Images:  2 FINDINGS: Right hip replacement is noted in satisfactory position. No acute bony abnormality is seen. IMPRESSION: Status post right hip replacement. Electronically Signed   By: Inez Catalina M.D.   On: 08/08/2018 10:39    Microbiology: Recent Results (from the past 240 hour(s))  SARS Coronavirus 2 (CEPHEID- Performed in [facility] hospital lab), Hosp Order     Status: None   Collection Time: 08/14/18  5:07 PM  Result Value Ref Range Status   SARS Coronavirus 2 NEGATIVE NEGATIVE Final    Comment: (NOTE) If result is NEGATIVE SARS-CoV-2 target nucleic acids are NOT DETECTED. The SARS-CoV-2 RNA is generally detectable in upper and lower  respiratory specimens during the acute phase of infection. The lowest  concentration of SARS-CoV-2 viral copies this assay can detect is 250  copies / mL. A negative result does not preclude SARS-CoV-2 infection  and should not be used as the sole basis for treatment or other  patient management decisions.  A negative result may occur with  improper specimen  collection / handling, submission of specimen other  than nasopharyngeal swab, presence of viral mutation(s) within the  areas targeted by this assay, and inadequate number of viral copies  (<250 copies / mL). A negative result must be combined with clinical  observations, patient history, and epidemiological information. If result is POSITIVE SARS-CoV-2 target nucleic acids are DETECTED. The SARS-CoV-2 RNA is generally detectable in upper and lower  respiratory specimens dur ing the acute phase of infection.  Positive  results are indicative of active infection with SARS-CoV-2.  Clinical  correlation with patient history and other diagnostic information is  necessary to determine patient infection status.  Positive results do  not rule out bacterial infection or co-infection with other viruses. If result is PRESUMPTIVE POSTIVE SARS-CoV-2 nucleic acids MAY BE PRESENT.   A presumptive positive result was obtained on the submitted specimen  and confirmed on repeat testing.  While 2019 novel coronavirus  (SARS-CoV-2) nucleic acids  may be present in the submitted sample  additional confirmatory testing may be necessary for epidemiological  and / or clinical management purposes  to differentiate between  SARS-CoV-2 and other Sarbecovirus currently known to infect humans.  If clinically indicated additional testing with an alternate test  methodology [phone]) is advised. The SARS-CoV-2 RNA is generally  detectable in upper and lower respiratory sp ecimens during the acute  phase of infection. The expected result is Negative. Fact Sheet for Patients:  StrictlyIdeas.no Fact Sheet for Healthcare Providers: BankingDealers.co.za This test is not yet approved or cleared by the [place] FDA and has been authorized for detection and/or diagnosis of SARS-CoV-2 by FDA under an Emergency Use Authorization (EUA).  This EUA will remain in effect  (meaning this test can be used) for the duration of the COVID-19 declaration under Section 564(b)(1) of the Act, 21 U.S.C. section 360bbb-3(b)(1), unless the authorization is terminated or revoked sooner. Performed at [facility], [place] [address], [place], [place] [address]      Labs: Basic Metabolic Panel: Recent Labs  Lab 08/09/18 0327 08/14/18 1700 08/15/18 0547  NA 139 138 137  K 4.5 4.0 4.3  CL 111 100 102  CO2 22 25 23   GLUCOSE 126* 117* 154*  BUN 21 16 16   CREATININE 1.14* 0.86 0.95  CALCIUM 8.5* 9.3 9.3   Liver Function Tests: Recent Labs  Lab 08/14/18 1700 08/15/18 0547  AST 45* 37  ALT 58* 51*  ALKPHOS 262* 262*  BILITOT 1.2 1.5*  PROT 6.2* 5.9*  ALBUMIN 2.4* 2.4*   No results for input(s): LIPASE, AMYLASE in the last 168 hours. No results for input(s): AMMONIA in the last 168 hours. CBC: Recent Labs  Lab 08/09/18 0327 08/14/18 1700 08/15/18 0547  WBC 12.6* 16.2* 17.3*  NEUTROABS  --  13.1*  --   HGB 9.4* 10.4* 10.4*  HCT 30.8* 32.7* 31.7*  MCV 96.6 93.7 92.2  PLT 239 379 357   Cardiac Enzymes: Recent Labs  Lab 08/14/18 1700 08/14/18 1920 08/15/18 0034 08/15/18 0547  TROPONINI <0.03 <0.03 <0.03 <0.03   BNP: BNP (last 3 results) No results for input(s): BNP in the last 8760 hours.  ProBNP (last 3 results) No results for input(s): PROBNP in the last 8760 hours.  CBG: No results for input(s): GLUCAP in the last 168 hours.     SignedRadene Gunning NP [facility] 08/15/2018, 11:37 AM

## 2018-08-15 NOTE — Progress Notes (Signed)

## 2018-08-15 NOTE — Plan of Care (Signed)
  Problem: Education: Goal: Knowledge of General Education information will improve Description Including pain rating scale, medication(s)/side effects and non-pharmacologic comfort measures Outcome: Progressing   

## 2018-08-15 NOTE — Progress Notes (Signed)
ANTICOAGULATION CONSULT NOTE - Initial Consult  Pharmacy Consult for Eliquis Indication: VTE prophylaxis  No Known Allergies  Patient Measurements: Height: 5\' 6"  (167.6 cm) Weight: 160 lb 15 oz (73 kg) IBW/kg (Calculated) : 59.3  Vital Signs: Temp: 99.1 F (37.3 C) (05/21 0042) Temp Source: Oral (05/21 0042) BP: 111/73 (05/21 0042) Pulse Rate: 107 (05/21 0042)  Labs: Recent Labs    08/14/18 1700 08/14/18 1920  HGB 10.4*  --   HCT 32.7*  --   PLT 379  --   CREATININE 0.86  --   TROPONINI <0.03 <0.03    Estimated Creatinine Clearance: 62.3 mL/min (by C-G formula based on SCr of 0.86 mg/dL).   Medical History: Past Medical History:  Diagnosis Date  . AICD (automatic cardioverter/defibrillator) present   . Cancer ([place])    Metastic Breast Cancer  . Cardiomyopathy ([place])   . CHF (congestive heart failure) ([facility])   . Complication of anesthesia   . Macular degeneration   . Pneumonia   . PONV (postoperative nausea and vomiting)     Medications:  Medications Prior to Admission  Medication Sig Dispense Refill Last Dose  . anastrozole (ARIMIDEX) 1 MG tablet Take 1 mg by mouth daily.   08/14/2018 at am  . apixaban (ELIQUIS) 2.5 MG TABS tablet Take 1 tablet (2.5 mg total) by mouth every 12 (twelve) hours. 60 tablet 0 08/14/2018 at 0900  . aspirin EC 81 MG tablet Take 81 mg by mouth daily.   08/14/2018 at 0900  . carvedilol (COREG) 12.5 MG tablet Take 12.5 mg by mouth 2 (two) times a day.   08/14/2018 at 0900  . Cholecalciferol (VITAMIN D3) 125 MCG (5000 UT) CAPS Take 5,000 Units by mouth daily.   08/07/2018 at am  . Coenzyme Q10 (COQ-10) 200 MG CAPS Take 200 mg by mouth daily.   08/07/2018 at am  . diphenhydramine-acetaminophen (TYLENOL PM) 25-500 MG TABS tablet Take 2 tablets by mouth at bedtime as needed (for sleep).    Past Month at Unknown time  . docusate sodium (COLACE) 100 MG capsule Take 1 capsule (100 mg total) by mouth 2 (two) times daily. 60 capsule 1 08/14/2018 at am   . furosemide (LASIX) 20 MG tablet Take 20 mg by mouth daily.   08/14/2018 at Unknown time  . HYDROcodone-acetaminophen (NORCO/VICODIN) 5-325 MG tablet Take 1 tablet by mouth every 4 (four) hours as needed for moderate pain (pain score 4-6). 30 tablet 0 08/14/2018 at 1330  . Multiple Vitamins-Minerals (PRESERVISION AREDS 2) CAPS Take 1 capsule by mouth 2 (two) times a day.   08/14/2018 at am  . Omega-3 Fatty Acids (FISH OIL) 1200 MG CAPS Take 1,200 mg by mouth daily.   08/07/2018 at am  . ondansetron (ZOFRAN) 4 MG tablet Take 1 tablet (4 mg total) by mouth every 6 (six) hours as needed for nausea (use 1st). 20 tablet 0 unk at unk  . Polyvinyl Alcohol-Povidone (REFRESH OP) Place 1 drop into both eyes 2 (two) times daily as needed.    unk at unk  . senna (SENOKOT) 8.6 MG TABS tablet Take 2 tablets (17.2 mg total) by mouth at bedtime. 60 tablet 1 08/13/2018 at pm  . spironolactone (ALDACTONE) 25 MG tablet Take 25 mg by mouth 2 (two) times daily.    08/14/2018 at am  . vitamin C (ASCORBIC ACID) 500 MG tablet Take 500 mg by mouth daily.   08/07/2018 at am    Assessment: 73 y.o. female admitted with chest pain,  s/p THA 5/15, to continue VTE prophylaxis with Eliquis   Plan:  Eliquis 2.5 mg BID  Caryl Pina 08/15/2018,12:52 AM

## 2020-04-05 IMAGING — CT CT OF THE LOWER RIGHT EXTREMITY WITHOUT CONTRAST
3 of 4 series · 10 of 33 positions shown, 11 images · non-contrast
Comparison: None.

CLINICAL DATA: Right femur pain for 1 year. Prior history of breast
cancer.

EXAM:
CT OF THE LOWER RIGHT EXTREMITY WITHOUT CONTRAST
TECHNIQUE: Multidetector CT imaging of the right lower extremity was performed
according to the standard protocol.

[Series 11: sfov lower extremity 2.00 br40 s3 cor soft · coronal · 0.49mm/px · 3 of 126 slices shown]
[im 26/126  bone]
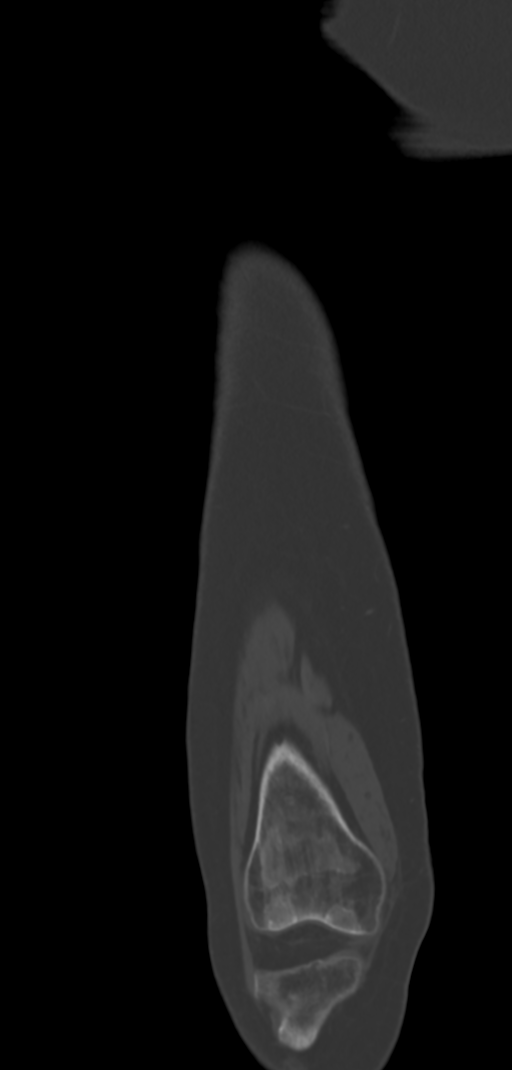
[im 51/126  bone]
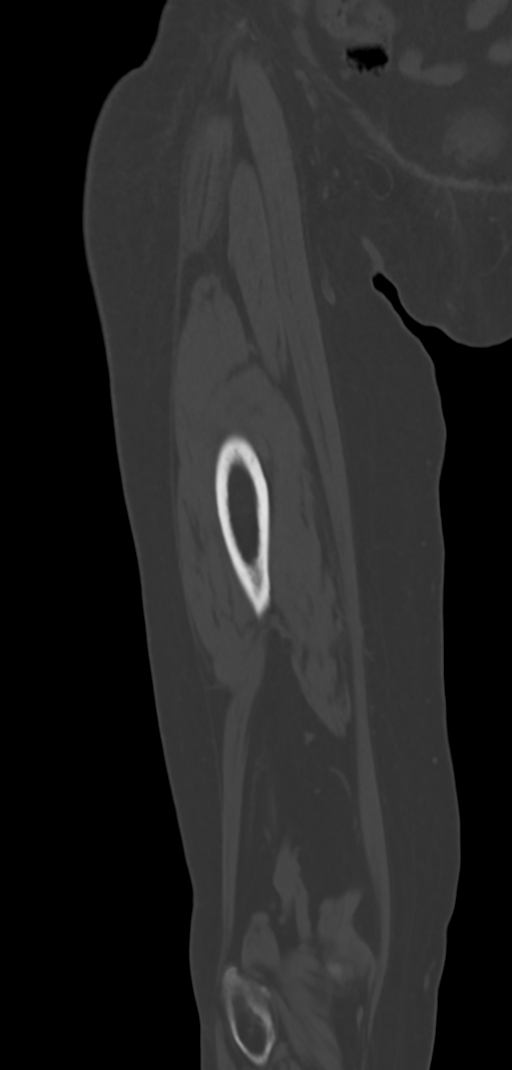
[im 76/126  bone]
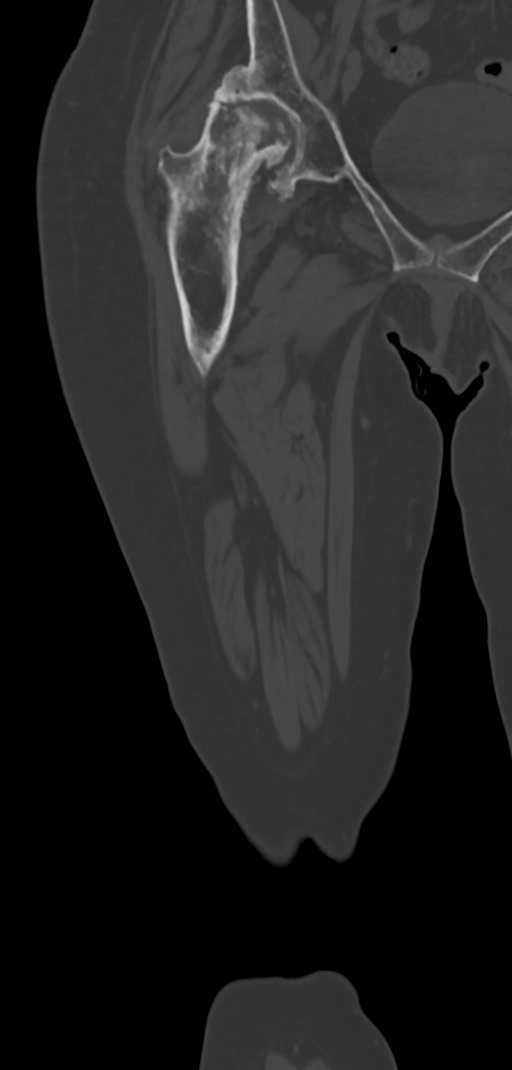

[Series 15: sfov lower extremity 2.00 br40 s3 sag soft · sagittal · 0.49mm/px · 5 of 126 slices shown]
[im 42/126  bone]
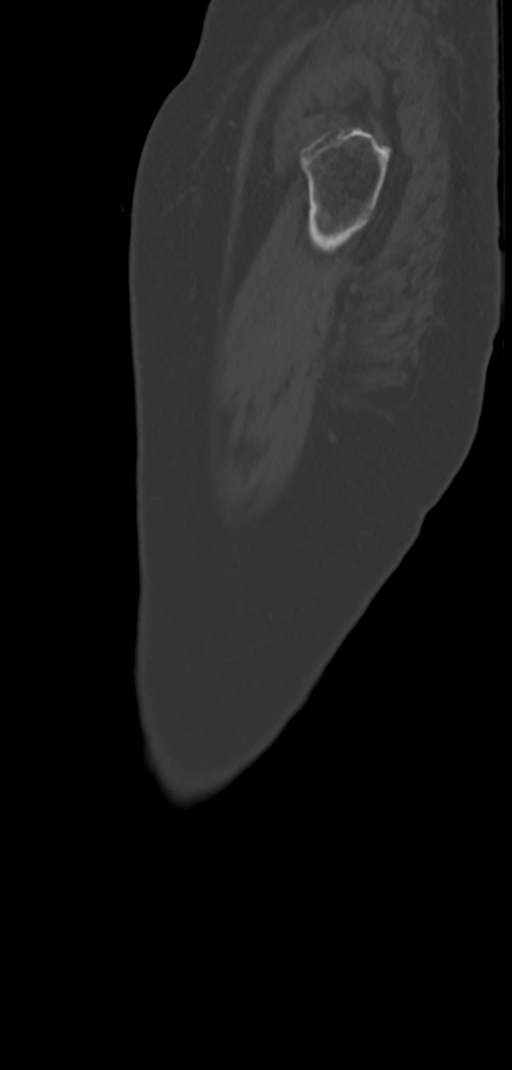
[im 53/126  bone]
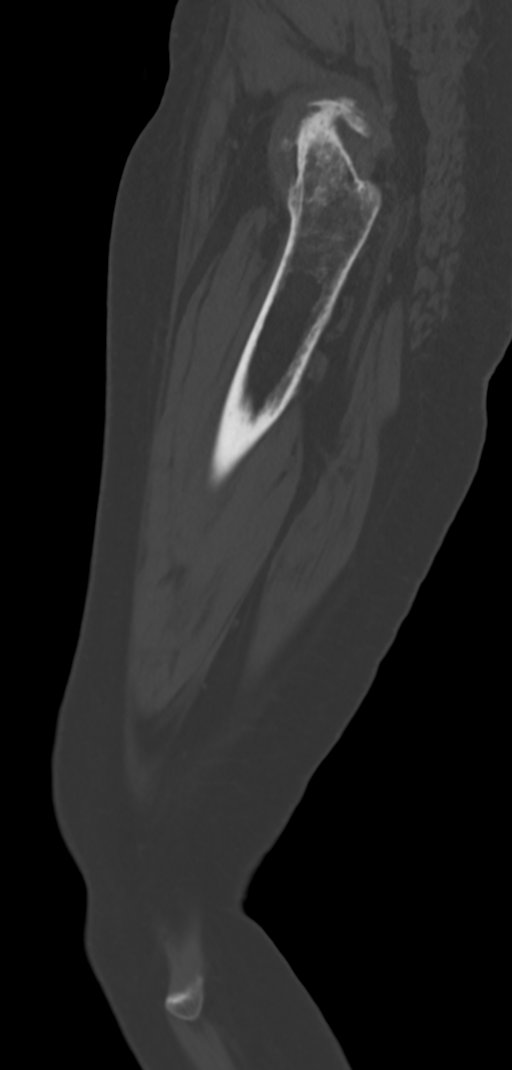
[im 63/126  bone]
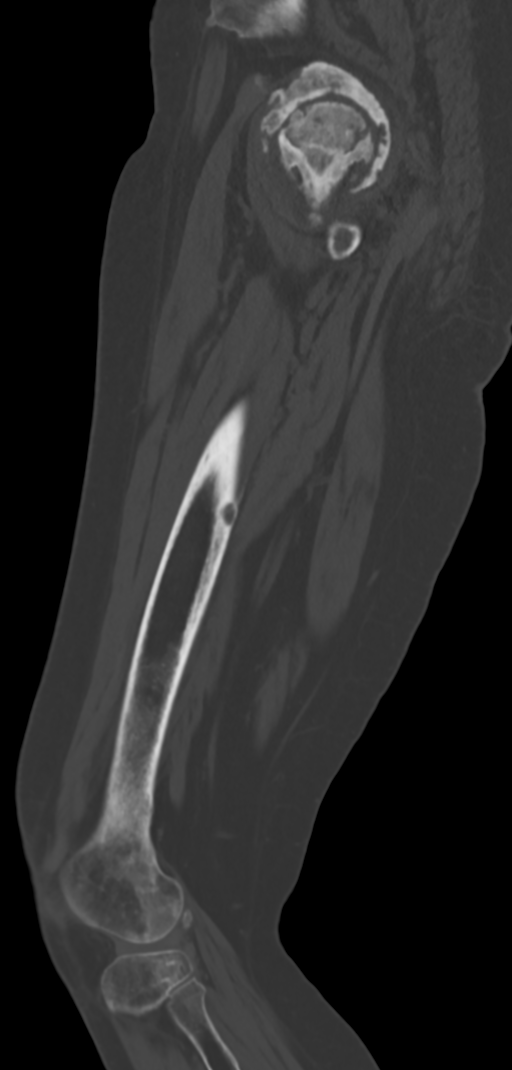
[im 73/126  bone]
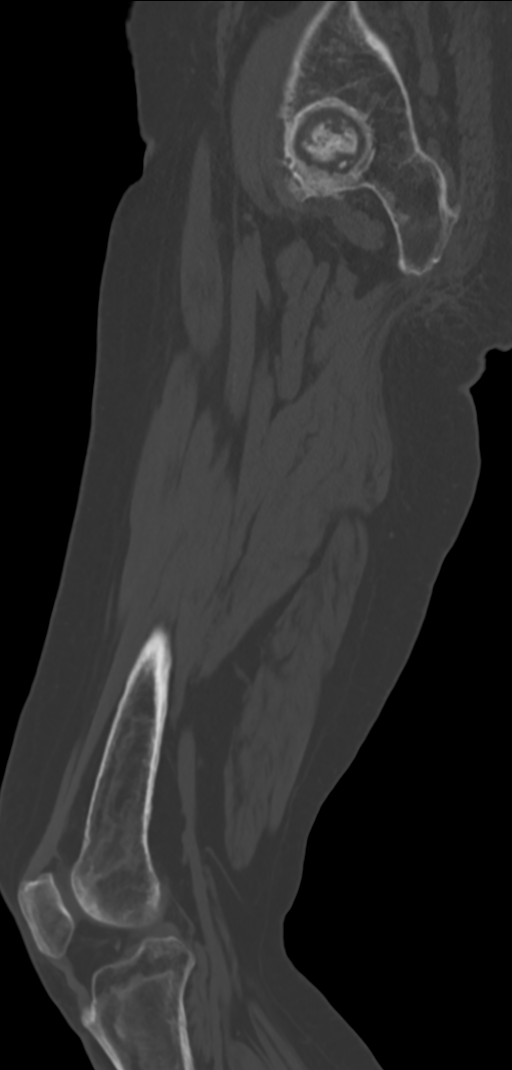
[im 84/126  bone]
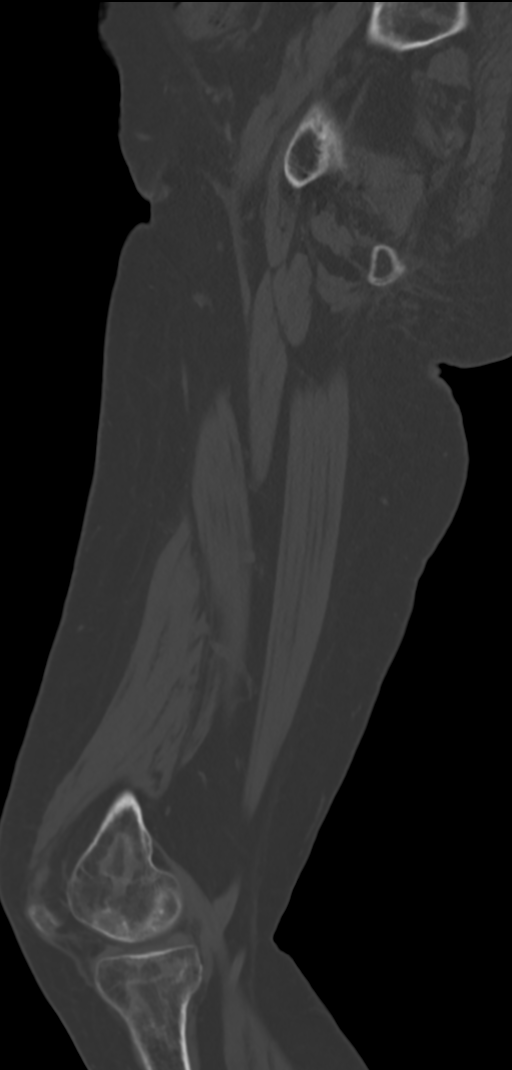

[Series 18: sfov lower extremity 0.60 br40 s3 soft thin · axial · 0.49mm/px · z∈[+1019,+1229]mm · 2 of 878 slices shown, 3 images]
[im 264/878  soft-tissue]
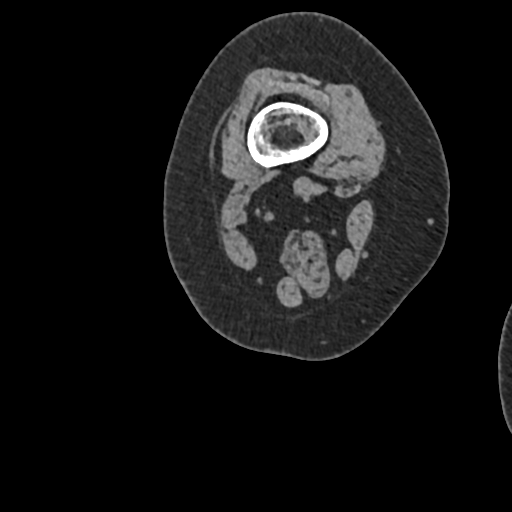
[im 264/878  bone]
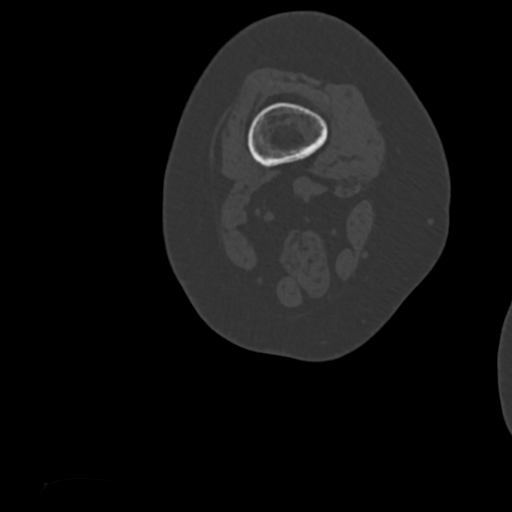
[im 614/878  bone]
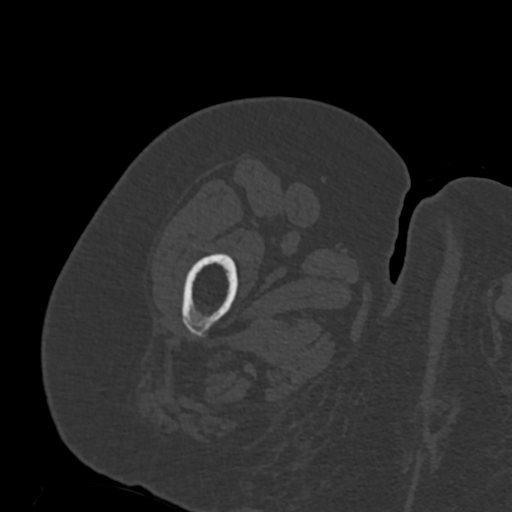

[10 of 33 positions shown; findings below may reference images not displayed]

FINDINGS: The right hip demonstrates chronic AVN with marked head
collapse/pathologic fracture. Associated severe hip joint
degenerative changes.

Subchondral cystic change involving the acetabulum but no fracture.
There is a destructive bone lesion involving the right iliac bone,
likely a breast cancer metastasis. There is also a lytic cortical
lesion involving the posterior cortex of the femur in the mid
femoral region. No pathologic fracture.

Suspect small cortical lesion involving the anterior cortex of the
distal femur there are remote infarcts involving the distal femur
and proximal tibia.
IMPRESSION: 1. Severe chronic AVN with head collapse/pathologic fracture and
associated severe right hip joint degenerative changes.
2. Findings of bone metastasis with a destructive cortical lesion
involving the right iliac bone and 2 small femoral lesions.
Whole-body bone scan may be helpful for further evaluation of the
skeleton.
3. Remote distal femur and proximal tibial bone infarcts.

## 2021-03-14 ENCOUNTER — Inpatient Hospital Stay
Admit: 2021-03-14 | Discharge: 2021-03-15 | Disposition: A | Discharge status: Home / Self Care | Payer: MEDICARE | Attending: Emergency Medicine

## 2021-03-14 ENCOUNTER — Emergency Department: Admit: 2021-03-15 | Payer: MEDICARE

## 2021-03-14 DIAGNOSIS — J90 Pleural effusion, not elsewhere classified: Secondary | ICD-10-CM

## 2021-03-14 NOTE — ED Notes (Signed)
Back from CT    Up for discharge

## 2021-03-14 NOTE — ED Notes (Signed)
Went over discharge instructions at this time.     Patient verbalized understanding with all instructions at this time.     Patient ambulated out independently, gait steady in no distress.

## 2021-03-14 NOTE — ED Notes (Signed)
Patient on the way to CT

## 2021-03-14 NOTE — ED Provider Notes (Signed)
EMERGENCY DEPARTMENT HISTORY AND PHYSICAL EXAM      Date: 03/14/2021  Patient Name: Ariel Day    History of Presenting Illness     Chief Complaint   Patient presents with    Shortness of Breath     Patient reports SOB (worse on exertion), dizziness, intermittent midsternal chest pain x 3 days. Patient currently undergoing treatment for breast cancer. Hx cardiomyopathy.        History Provided By: Patient    HPI: Ariel Day, 75 y.o. female  presents to the ED with cc of shortness of breath.  Patient states she has had complaints of shortness of breath.  She states it is worse with exertion.  She has had associated substernal chest discomfort.  This is been intermittent not currently present.  She has had some lightheadedness and dizziness.  She does have a history of a cardiomyopathy.  Unknown last EF.  She is currently undergoing treatment for breast cancer which she states has stabilized.  She has had swelling in her legs.  Recently she drove to [place] and was in the car for about 13 hours.  No PE or DVT history.  No anticoagulation.    Past History     Past Medical History:  No past medical history on file.    Past Surgical History:  No past surgical history on file.    Medications:  No current facility-administered medications on file prior to encounter.     No current outpatient medications on file prior to encounter.       Family History:  No family history on file.    Social History:       Allergies:  No Known Allergies    All the above components of the past  history are auto-populated from the electronic record.  They have been reviewed and the patient has been interviewed for any pertinent past history that pertains to the patient's chief complaint and reason for visit.  Not all pre-populated components may be accurate at the time this note was generated.    Review of Systems   Review of Systems   Constitutional:  Negative for chills and fever.   HENT:  Negative for congestion, ear pain, rhinorrhea, sore  throat and trouble swallowing.    Eyes:  Negative for visual disturbance.   Respiratory:  Positive for cough and shortness of breath. Negative for chest tightness.    Cardiovascular:  Positive for chest pain and leg swelling. Negative for palpitations.   Gastrointestinal:  Negative for abdominal pain, blood in stool, constipation, diarrhea, nausea and vomiting.   Genitourinary:  Negative for decreased urine volume, difficulty urinating, dysuria and frequency.   Musculoskeletal:  Negative for back pain and neck pain.   Skin:  Negative for color change and rash.   Neurological:  Negative for dizziness, weakness, light-headedness and headaches.     Physical Exam   Physical Exam  Vitals and nursing note reviewed.   Constitutional:       General: She is not in acute distress.     Appearance: She is well-developed. She is not ill-appearing.   HENT:      Head: Normocephalic.   Eyes:      Conjunctiva/sclera: Conjunctivae normal.   Cardiovascular:      Rate and Rhythm: Normal rate and regular rhythm.   Pulmonary:      Effort: Pulmonary effort is normal. No accessory muscle usage or respiratory distress.   Abdominal:      General: There is  no distension.   Musculoskeletal:      Cervical back: Normal range of motion.      Right lower leg: Edema present.      Left lower leg: Edema present.   Skin:     General: Skin is warm and dry.   Neurological:      Mental Status: She is alert and oriented to person, place, and time.       Diagnostic Study Results     Labs -     Recent Results (from the past 24 hour(s))   EKG, 12 LEAD, INITIAL    Collection Time: 03/14/21  7:20 PM   Result Value Ref Range    Ventricular Rate 87 BPM    Atrial Rate 87 BPM    P-R Interval 104 ms    QRS Duration 118 ms    Q-T Interval 426 ms    QTC Calculation (Bezet) 512 ms    Calculated P Axis 40 degrees    Calculated R Axis -75 degrees    Calculated T Axis 7 degrees    Diagnosis       Atrial-sensed ventricular-paced rhythm  Biventricular pacemaker  detected  No previous ECGs available     CBC WITH AUTOMATED DIFF    Collection Time: 03/14/21  7:23 PM   Result Value Ref Range    WBC 13.4 (H) 3.6 - 11.0 K/uL    RBC 3.73 (L) 3.80 - 5.20 M/uL    HGB 12.3 11.5 - 16.0 g/dL    HCT 37.7 35.0 - 47.0 %    MCV 101.1 (H) 80.0 - 99.0 FL    MCH 33.0 26.0 - 34.0 PG    MCHC 32.6 30.0 - 36.5 g/dL    RDW 16.9 (H) 11.5 - 14.5 %    PLATELET 387 150 - 400 K/uL    MPV 9.3 8.9 - 12.9 FL    NRBC 0.0 0 PER 100 WBC    ABSOLUTE NRBC 0.00 0.00 - 0.01 K/uL    NEUTROPHILS 86 (H) 32 - 75 %    LYMPHOCYTES 9 (L) 12 - 49 %    MONOCYTES 5 5 - 13 %    EOSINOPHILS 0 0 - 7 %    BASOPHILS 0 0 - 1 %    IMMATURE GRANULOCYTES 0 0.0 - 0.5 %    ABS. NEUTROPHILS 11.4 (H) 1.8 - 8.0 K/UL    ABS. LYMPHOCYTES 1.2 0.8 - 3.5 K/UL    ABS. MONOCYTES 0.7 0.0 - 1.0 K/UL    ABS. EOSINOPHILS 0.0 0.0 - 0.4 K/UL    ABS. BASOPHILS 0.0 0.0 - 0.1 K/UL    ABS. IMM. GRANS. 0.1 (H) 0.00 - 0.04 K/UL    DF AUTOMATED     METABOLIC PANEL, COMPREHENSIVE    Collection Time: 03/14/21  7:23 PM   Result Value Ref Range    Sodium 137 136 - 145 mmol/L    Potassium 4.7 3.5 - 5.1 mmol/L    Chloride 103 97 - 108 mmol/L    CO2 28 21 - 32 mmol/L    Anion gap 6 5 - 15 mmol/L    Glucose 118 (H) 65 - 100 mg/dL    BUN 16 6 - 20 MG/DL    Creatinine 0.94 0.55 - 1.02 MG/DL    BUN/Creatinine ratio 17 12 - 20      eGFR >60 >60 ml/min/1.38m    Calcium 9.5 8.5 - 10.1 MG/DL    Bilirubin, total 0.7 0.2 - 1.0 MG/DL  ALT (SGPT) 35 12 - 78 U/L    AST (SGOT) 40 (H) 15 - 37 U/L    Alk. phosphatase 217 (H) 45 - 117 U/L    Protein, total 6.8 6.4 - 8.2 g/dL    Albumin 2.6 (L) 3.5 - 5.0 g/dL    Globulin 4.2 (H) 2.0 - 4.0 g/dL    A-G Ratio 0.6 (L) 1.1 - 2.2     TROPONIN-HIGH SENSITIVITY    Collection Time: 03/14/21  7:23 PM   Result Value Ref Range    Troponin-High Sensitivity 10 0 - 51 ng/L   NT-PRO BNP    Collection Time: 03/14/21  7:23 PM   Result Value Ref Range    NT pro-BNP 550 (H) <125 PG/ML       Radiologic Studies -   CTA CHEST W OR W WO CONT   Final  Result      No evidence of pulmonary embolism.   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      XR CHEST PA LAT   Final Result   Small pleural effusions. No pulmonary edema.        CT Results  (Last 48 hours)                 03/14/21 2254  CTA CHEST W OR W WO CONT Final result    Impression:      No evidence of pulmonary embolism.   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       Narrative:  EXAM: CTA CHEST W OR W WO CONT       INDICATION: shortness of breath, breast cancer, edema       COMPARISON: None.       CONTRAST: 100 mL of Isovue-370.       TECHNIQUE:    Precontrast scout images were obtained to localize the volume for acquisition.   Multislice helical CT arteriography was performed from the diaphragm to the   thoracic inlet during uneventful rapid bolus intravenous contrast   administration. Lung and soft tissue windows were generated.  Coronal and   sagittal images were generated and 3D post processing consisting of coronal   maximum intensity images was performed.  CT dose reduction was achieved through   use of a standardized protocol tailored for this examination and automatic   exposure control for dose modulation.       FINDINGS:   There are multiple thyroid nodules, subcentimeter in size. A few punctate   calcifications are present within the thyroid.       There are bilateral small moderate pleural effusions, larger on the right, with   density measurements of 12 HU.       The lungs are clear of mass, nodule, airspace disease or edema.       The pulmonary arteries are well enhanced and no pulmonary emboli are identified.       There is no mediastinal or hilar adenopathy or mass. The aorta enhances normally   without evidence of aneurysm or dissection.       The liver is macrolobulated and there is a  moderate amount of ascites.       Innumerable lytic changes are present throughout the skeleton patient with known   history of breast cancer.  A cardiac device is implanted in the superior left breast.                 CXR Results  (Last 48 hours)                 03/14/21 1943  XR CHEST PA LAT Final result    Impression:  Small pleural effusions. No pulmonary edema.       Narrative:  INDICATION: Shortness of Breath       EXAM: CXR 2 Views.       COMPARISON: None.       FINDINGS:   Frontal and lateral views of the chest show small pleural effusions. Lungs are   clear. Heart size is normal. There is no pulmonary edema or pneumothorax. ICD is   present.                     Medical Decision Making     I reviewed the vital signs, available nursing notes, past medical history, past surgical history, family history and social history.    Vital Signs-I have reviewed the vital signs that have been made available during the patient's emergency department visit.  The vital signs auto-populated below are obtained mostly by electronic means through monitoring devices that have been downloaded into the patient's chart by the nursing staff.  Some vital signs are not downloaded into the chart until after the patient has been discharged and this note has been completed, therefore some vital signs may not be available to the physician for review prior to patient's discharge or admission.  The physician has reviewed the patient's triage vital signs, monitored the electronic monitoring devices remotely for any significant vital sign abnormalities, and have reviewed vital signs prior to discharge.  Some vital signs reviewed at bedside or remotely utilizing electronic monitoring devices may be different than the vital signs downloaded into the electronic medical record.  Some vital signs may be erroneous and inaccurate since they are obtained by electronic monitoring devices, and not all vital signs are verified for accuracy by  nursing staff prior to downloading into the patient's chart.  Patient Vitals for the past 24 hrs:   Pulse Resp BP SpO2   03/14/21 1909 (!) 104 18 109/81 100 %         Records Reviewed: Nursing notes for today's visit have been reviewed.  I have also reviewed most recent medical records pertinent to today's complaints, if available in our medical record system.  I have also reviewed all labs and imaging results from previous results in comparison to results obtained today.  If an EKG was obtained today, it has been compared to previous EKGs, if available.  If arriving via EMS, the EMS report has been reviewed if made available to [place] within the patient's time in the emergency department.    ED Course and Medical Decision Making:       MDM  Number of Diagnoses or Management Options  DOE (dyspnea on exertion)  Pleural effusion  Diagnosis management comments: Patient with a history of breast cancer and pleural effusions presents with dyspnea on exertion.  She did just recently have an extended travel.  No history of PE or DVT.  She is not hypoxic or in respiratory distress.  CTA imaging does not show any evidence of pulmonary embolism.  She does have bilateral pleural effusions.  These are not large effusions that will require thoracentesis.  Patient currently is no longer on diuretics.  She has good kidney function.  I would like to start her on Lasix to help diurese her and help with her shortness of breath.  Recommend follow-up with her cardiologist.       Amount and/or Complexity of Data Reviewed  Clinical lab tests: ordered and reviewed  Tests in the radiology section of CPT??: ordered and reviewed  Review and summarize past medical records: yes  Independent visualization of images, tracings, or specimens: yes    Risk of Complications, Morbidity, and/or Mortality  Presenting problems: moderate  Diagnostic procedures: moderate  Management options: moderate    Patient Progress  Patient progress: stable           Orders  Placed This Encounter    XR CHEST PA LAT    CTA CHEST W OR W WO CONT    CBC WITH AUTOMATED DIFF    METABOLIC PANEL, COMPREHENSIVE    TROPONIN-HIGH SENSITIVITY    NT-PRO BNP    SOB PANEL TRACKING (DO NOT DESELECT)    OXYGEN CANNULA (To maintain O2 sat greater than 92%) Consult MD if Hx. of COPD    PULSE OXIMETRY SPOT CHECK    EKG NOTEWRITER(ASAP ONLY)    EKG, 12 LEAD, INITIAL    SALINE LOCK IV ONE TIME STAT    iopamidoL (ISOVUE-370) 370 mg iodine /mL (76 %) injection 100 mL    furosemide (Lasix) 40 mg tablet       EKG    Date/Time: 03/14/2021 7:20 PM  Performed by: Alex Gardener, MD  Authorized by: Alex Gardener, MD     ECG reviewed by ED Physician in the absence of a cardiologist: yes    Rate:     ECG rate:  87    ECG rate assessment: normal    Rhythm:     Rhythm: paced    Pacing:     Capture:  Complete    Type of pacing:  Atrial      Critical Care Time:   0    Disposition:  Discharge    The patient's emergency department evaluation is now complete.  I have reviewed all labs, imaging, and pertinent information.  I have discussed all results with the patient and/or family.  Based on our evaluation today I do believe that the patient is safe to be discharged home.  The patient has been provided with at home instructions that are pertinent to their complaint today, although these may not be specific to the exact diagnosis.  I have reviewed the patient's home medications and attempted to reconcile if not already done so by pharmacy or nursing staff.  I have discussed all new prescriptions with the patient.  The patient has been encouraged to follow-up with primary care doctor and/or specialist, and these have been discussed with the patient.  The patient has been advised that they may return to the emergency department if they have any worsening symptoms and or new symptoms that are of concern to them.  Verbal discharge instructions may have also been provided to the patient that may not be specifically contained  in the written discharge instructions.  The patient has been given opportunity to ask questions prior to discharge.      PLAN:  1.   Current Discharge Medication List        START taking these medications    Details   furosemide (Lasix) 40 mg tablet Take 1 Tablet by mouth daily for 14 days.  Qty: 14 Tablet, Refills: 0  Start date: 03/14/2021, End date: 03/28/2021           2.   Follow-up Information       Follow up With Specialties Details Why [facility]  Schedule an appointment as soon as possible for a visit  [phone] [address]  [address] [place] [address]          Return to ED if worse     Diagnosis     Clinical Impression:   1. Pleural effusion    2. DOE (dyspnea on exertion)

## 2021-03-15 LAB — COMPREHENSIVE METABOLIC PANEL
ALT: 35 U/L (ref 12–78)
AST: 40 U/L — ABNORMAL HIGH (ref 15–37)
Albumin/Globulin Ratio: 0.6 — ABNORMAL LOW (ref 1.1–2.2)
Albumin: 2.6 g/dL — ABNORMAL LOW (ref 3.5–5.0)
Alkaline Phosphatase: 217 U/L — ABNORMAL HIGH (ref 45–117)
Anion Gap: 6 mmol/L (ref 5–15)
BUN: 16 MG/DL (ref 6–20)
Bun/Cre Ratio: 17 (ref 12–20)
CO2: 28 mmol/L (ref 21–32)
Calcium: 9.5 MG/DL (ref 8.5–10.1)
Chloride: 103 mmol/L (ref 97–108)
Creatinine: 0.94 MG/DL (ref 0.55–1.02)
ESTIMATED GLOMERULAR FILTRATION RATE: >60 mL/min/{1.73_m2} (ref >60)
Globulin: 4.2 g/dL — ABNORMAL HIGH (ref 2.0–4.0)
Glucose: 118 mg/dL — ABNORMAL HIGH (ref 65–100)
Potassium: 4.7 mmol/L (ref 3.5–5.1)
Sodium: 137 mmol/L (ref 136–145)
Total Bilirubin: 0.7 MG/DL (ref 0.2–1.0)
Total Protein: 6.8 g/dL (ref 6.4–8.2)

## 2021-03-15 LAB — CBC WITH AUTO DIFFERENTIAL
Basophils %: 0 % (ref 0–1)
Basophils Absolute: 0 10*3/uL (ref 0.0–0.1)
Eosinophils %: 0 % (ref 0–7)
Eosinophils Absolute: 0 10*3/uL (ref 0.0–0.4)
Granulocyte Absolute Count: 0.1 10*3/uL — ABNORMAL HIGH (ref 0.00–0.04)
Hematocrit: 37.7 % (ref 35.0–47.0)
Hemoglobin: 12.3 g/dL (ref 11.5–16.0)
Immature Granulocytes: 0 % (ref 0.0–0.5)
Lymphocytes %: 9 % — ABNORMAL LOW (ref 12–49)
Lymphocytes Absolute: 1.2 10*3/uL (ref 0.8–3.5)
MCH: 33 PG (ref 26.0–34.0)
MCHC: 32.6 g/dL (ref 30.0–36.5)
MCV: 101.1 FL — ABNORMAL HIGH (ref 80.0–99.0)
MPV: 9.3 FL (ref 8.9–12.9)
Monocytes %: 5 % (ref 5–13)
Monocytes Absolute: 0.7 10*3/uL (ref 0.0–1.0)
NRBC Absolute: 0 10*3/uL (ref 0.00–0.01)
Neutrophils %: 86 % — ABNORMAL HIGH (ref 32–75)
Neutrophils Absolute: 11.4 10*3/uL — ABNORMAL HIGH (ref 1.8–8.0)
Nucleated RBCs: 0 PER 100 WBC
Platelets: 387 10*3/uL (ref 150–400)
RBC: 3.73 M/uL — ABNORMAL LOW (ref 3.80–5.20)
RDW: 16.9 % — ABNORMAL HIGH (ref 11.5–14.5)
WBC: 13.4 10*3/uL — ABNORMAL HIGH (ref 3.6–11.0)

## 2021-03-15 LAB — EKG 12-LEAD
Atrial Rate: 87 {beats}/min
P Axis: 40 degrees
P-R Interval: 104 ms
Q-T Interval: 426 ms
QRS Duration: 118 ms
QTc Calculation (Bazett): 512 ms
R Axis: -75 degrees
T Axis: 7 degrees
Ventricular Rate: 87 {beats}/min

## 2021-03-15 LAB — TROPONIN, HIGH SENSITIVITY: Troponin, High Sensitivity: 10 ng/L (ref 0–51)

## 2021-03-15 LAB — PROBNP, N-TERMINAL: BNP: 550 PG/ML — ABNORMAL HIGH (ref <125)

## 2021-03-15 LAB — CBC WITH AUTOMATED DIFF
ABS. BASOPHILS: 0 10*3/uL (ref 0.0–0.1)
ABS. EOSINOPHILS: 0 10*3/uL (ref 0.0–0.4)
ABS. IMM. GRANS.: 0.1 10*3/uL — ABNORMAL HIGH (ref 0.00–0.04)
ABS. LYMPHOCYTES: 1.2 10*3/uL (ref 0.8–3.5)
ABS. MONOCYTES: 0.7 10*3/uL (ref 0.0–1.0)
ABS. NEUTROPHILS: 11.4 10*3/uL — ABNORMAL HIGH (ref 1.8–8.0)
ABSOLUTE NRBC: 0 10*3/uL (ref 0.00–0.01)
BASOPHILS: 0 % (ref 0–1)
EOSINOPHILS: 0 % (ref 0–7)
HCT: 37.7 % (ref 35.0–47.0)
HGB: 12.3 g/dL (ref 11.5–16.0)
IMMATURE GRANULOCYTES: 0 % (ref 0.0–0.5)
LYMPHOCYTES: 9 % — ABNORMAL LOW (ref 12–49)
MCH: 33 PG (ref 26.0–34.0)
MCHC: 32.6 g/dL (ref 30.0–36.5)
MCV: 101.1 FL — ABNORMAL HIGH (ref 80.0–99.0)
MONOCYTES: 5 % (ref 5–13)
MPV: 9.3 FL (ref 8.9–12.9)
NEUTROPHILS: 86 % — ABNORMAL HIGH (ref 32–75)
NRBC: 0 PER 100 WBC
PLATELET: 387 10*3/uL (ref 150–400)
RBC: 3.73 M/uL — ABNORMAL LOW (ref 3.80–5.20)
RDW: 16.9 % — ABNORMAL HIGH (ref 11.5–14.5)
WBC: 13.4 10*3/uL — ABNORMAL HIGH (ref 3.6–11.0)

## 2021-03-15 LAB — METABOLIC PANEL, COMPREHENSIVE
A-G Ratio: 0.6 — ABNORMAL LOW (ref 1.1–2.2)
ALT (SGPT): 35 U/L (ref 12–78)
AST (SGOT): 40 U/L — ABNORMAL HIGH (ref 15–37)
Albumin: 2.6 g/dL — ABNORMAL LOW (ref 3.5–5.0)
Alk. phosphatase: 217 U/L — ABNORMAL HIGH (ref 45–117)
Anion gap: 6 mmol/L (ref 5–15)
BUN/Creatinine ratio: 17 (ref 12–20)
BUN: 16 MG/DL (ref 6–20)
Bilirubin, total: 0.7 MG/DL (ref 0.2–1.0)
CO2: 28 mmol/L (ref 21–32)
Calcium: 9.5 MG/DL (ref 8.5–10.1)
Chloride: 103 mmol/L (ref 97–108)
Creatinine: 0.94 MG/DL (ref 0.55–1.02)
Globulin: 4.2 g/dL — ABNORMAL HIGH (ref 2.0–4.0)
Glucose: 118 mg/dL — ABNORMAL HIGH (ref 65–100)
Potassium: 4.7 mmol/L (ref 3.5–5.1)
Protein, total: 6.8 g/dL (ref 6.4–8.2)
Sodium: 137 mmol/L (ref 136–145)
eGFR: >60 mL/min/{1.73_m2} (ref >60)

## 2021-03-15 LAB — EKG, 12 LEAD, INITIAL
Atrial Rate: 87 {beats}/min
Calculated P Axis: 40 degrees
Calculated R Axis: -75 degrees
Calculated T Axis: 7 degrees
P-R Interval: 104 ms
Q-T Interval: 426 ms
QRS Duration: 118 ms
QTC Calculation (Bezet): 512 ms
Ventricular Rate: 87 {beats}/min

## 2021-03-15 LAB — NT-PRO BNP: NT pro-BNP: 550 PG/ML — ABNORMAL HIGH (ref <125)

## 2021-03-15 LAB — TROPONIN-HIGH SENSITIVITY: Troponin-High Sensitivity: 10 ng/L (ref 0–51)

## 2021-03-15 MED ORDER — IOPAMIDOL 76 % IV SOLN
370 mg iodine /mL (76 %) | Freq: Once | INTRAVENOUS | Status: AC
Start: 2021-03-15 — End: 2021-03-14
  Administered 2021-03-15: 04:00:00 via INTRAVENOUS

## 2021-03-15 MED ORDER — FUROSEMIDE 40 MG TAB
40 mg | ORAL_TABLET | Freq: Every day | ORAL | 0 refills | Status: AC
Start: 2021-03-15 — End: 2021-03-28

## 2021-03-15 MED FILL — ISOVUE-370  76 % INTRAVENOUS SOLUTION: 370 mg iodine /mL (76 %) | INTRAVENOUS | Qty: 100

## 2021-10-07 ENCOUNTER — Emergency Department: Admit: 2021-10-07 | Payer: MEDICARE | Primary: Family Medicine

## 2021-10-07 ENCOUNTER — Inpatient Hospital Stay
Admission: EM | Admit: 2021-10-07 | Discharge: 2021-10-12 | Disposition: A | Discharge status: Home / Self Care | Payer: MEDICARE | Admitting: Internal Medicine

## 2021-10-07 DIAGNOSIS — J90 Pleural effusion, not elsewhere classified: Secondary | ICD-10-CM

## 2021-10-07 DIAGNOSIS — C50919 Malignant neoplasm of unspecified site of unspecified female breast: Principal | ICD-10-CM

## 2021-10-07 LAB — COMPREHENSIVE METABOLIC PANEL
ALT: 50 U/L (ref 12–78)
AST: 41 U/L — ABNORMAL HIGH (ref 15–37)
Albumin/Globulin Ratio: 0.9 — ABNORMAL LOW (ref 1.1–2.2)
Albumin: 3.3 g/dL — ABNORMAL LOW (ref 3.5–5.0)
Alk Phosphatase: 180 U/L — ABNORMAL HIGH (ref 45–117)
Anion Gap: 7 mmol/L (ref 5–15)
BUN: 20 MG/DL (ref 6–20)
Bun/Cre Ratio: 18 (ref 12–20)
CO2: 28 mmol/L (ref 21–32)
Calcium: 10.1 MG/DL (ref 8.5–10.1)
Chloride: 103 mmol/L (ref 97–108)
Creatinine: 1.09 MG/DL — ABNORMAL HIGH (ref 0.55–1.02)
Est, Glom Filt Rate: 54 mL/min/{1.73_m2} — ABNORMAL LOW (ref >60)
Globulin: 3.8 g/dL (ref 2.0–4.0)
Glucose: 127 mg/dL — ABNORMAL HIGH (ref 65–100)
Potassium: 4.2 mmol/L (ref 3.5–5.1)
Sodium: 138 mmol/L (ref 136–145)
Total Bilirubin: 0.9 MG/DL (ref 0.2–1.0)
Total Protein: 7.1 g/dL (ref 6.4–8.2)

## 2021-10-07 LAB — CBC WITH AUTO DIFFERENTIAL
Absolute Immature Granulocyte: 0.1 10*3/uL — ABNORMAL HIGH (ref 0.00–0.04)
Basophils %: 0 % (ref 0–1)
Basophils Absolute: 0 10*3/uL (ref 0.0–0.1)
Eosinophils %: 1 % (ref 0–7)
Eosinophils Absolute: 0.1 10*3/uL (ref 0.0–0.4)
Hematocrit: 41.6 % (ref 35.0–47.0)
Hemoglobin: 13.5 g/dL (ref 11.5–16.0)
Immature Granulocytes: 1 % — ABNORMAL HIGH (ref 0.0–0.5)
Lymphocytes %: 7 % — ABNORMAL LOW (ref 12–49)
Lymphocytes Absolute: 0.8 10*3/uL (ref 0.8–3.5)
MCH: 34.2 PG — ABNORMAL HIGH (ref 26.0–34.0)
MCHC: 32.5 g/dL (ref 30.0–36.5)
MCV: 105.3 FL — ABNORMAL HIGH (ref 80.0–99.0)
MPV: 10.2 FL (ref 8.9–12.9)
Monocytes %: 4 % — ABNORMAL LOW (ref 5–13)
Monocytes Absolute: 0.5 10*3/uL (ref 0.0–1.0)
Neutrophils %: 87 % — ABNORMAL HIGH (ref 32–75)
Neutrophils Absolute: 10.2 10*3/uL — ABNORMAL HIGH (ref 1.8–8.0)
Nucleated RBCs: 0 PER 100 WBC
Platelets: 295 10*3/uL (ref 150–400)
RBC: 3.95 M/uL (ref 3.80–5.20)
RDW: 17.9 % — ABNORMAL HIGH (ref 11.5–14.5)
WBC: 11.7 10*3/uL — ABNORMAL HIGH (ref 3.6–11.0)
nRBC: 0 10*3/uL (ref 0.00–0.01)

## 2021-10-07 LAB — TROPONIN: Troponin, High Sensitivity: 22 ng/L (ref 0–51)

## 2021-10-07 LAB — BRAIN NATRIURETIC PEPTIDE: NT Pro-BNP: 489 PG/ML — ABNORMAL HIGH (ref <125)

## 2021-10-07 MED ORDER — SODIUM CHLORIDE 0.9 % IV BOLUS
0.9 % | Freq: Once | INTRAVENOUS | Status: AC
Start: 2021-10-07 — End: 2021-10-07
  Administered 2021-10-07: 21:00:00 500 mL via INTRAVENOUS

## 2021-10-07 MED ORDER — IOPAMIDOL 76 % IV SOLN
76 % | Freq: Once | INTRAVENOUS | Status: AC | PRN
Start: 2021-10-07 — End: 2021-10-07
  Administered 2021-10-07: 22:00:00 100 mL via INTRAVENOUS

## 2021-10-07 MED FILL — ISOVUE-370 76 % IV SOLN: 76 % | INTRAVENOUS | Qty: 100

## 2021-10-07 NOTE — H&P (Addendum)
Hospitalist Admission Note    NAME:  Ariel Day   DOB:  10-30-1945   MRN:  [id number]     Date/Time:  10/07/2021 8:29 PM    Patient PCP: No primary care provider on file.    Please note that this dictation was completed with Dragon, the computer voice recognition software.  Quite often unanticipated grammatical, syntax, homophones, and other interpretive errors are inadvertently transcribed by the computer software.  Please disregard these errors.  Please excuse any errors that have escaped final proofreading  ______________________________________________________________________  Given the patient's current clinical presentation, I have a high level of concern for decompensation if discharged from the emergency department.  Complex decision making was performed, which includes reviewing the patient's available past medical records, laboratory results, and x-ray films.       My assessment of this patient's clinical condition and my plan of care is as follows.    Assessment / Plan:    Active Problems:  Recurrent large right malignant pleural effusion and moderate left pleural effusion, POA  --not hypoxic at rest but severe dyspnea RR 31  --consult IR for diagnostic and therapeutic thoracentesis in AM, Called and discussed with Dr. Burton Apley.  NPO p MN.  --should discuss with her oncologist about potential thoracic surgery referral for pleurodesis vs. Pleurx catheter as this would be the third time she requires thoracentesis, last done 07/2020 at [facility] with removal of    Metastatic breast cancer with mets to liver and bone, POA dx 5 years ago  --cont xeloda (1 dose tonight; pt to provide) on regimen every other week  --was seeing Dr. Jed Limerick in [place] but recently moved to [place] and looking for local oncologist.  Refer to [facility] oncology at discharge    Orthostatic hypotension  --cont midodrine bid  --cont prednisone 10mg  daily    Chronic diastolic chf, hx of CHFrEF s/p ICD  --cont aldactone 25mg   daily  --give lasix 20mg  IV x 1    Medical Decision Making:   I personally reviewed labs: Yes, as listed below  I personally reviewed imaging: CXR, large right pleural effusion occupying at least 1/2 lower lung  Toxic drug monitoring: n/a  Discussed case with: ED provider. After discussion I am in agreement that acuity of patient's medical condition necessitates hospital stay.      Code Status: dw pt, wants to be full code  DVT Prophylaxis: SCD; put on lovenox after thoracentesis  GI Prophylaxis: none  Baseline: use walker when outside, cane inside home    Subjective:   CHIEF COMPLAINT: DOE    HISTORY OF PRESENT ILLNESS:     Ariel Day is a 76 y.o.  female with PMHx metastatic breast cancer with mets to bone and liver diagnosed 5 years ago, history of CHF with reduced EF EF 10% initially status post ICD with latest EF being 65% per patient, history of malignant pleural effusion and last had right thoracentesis at [facility] hospital May 2022 with removal of 700 mL presents with 2 days of shortness of breath just walking 4 to 5 feet, mild nonproductive cough.  Denies any fevers or chills.  She reports increased bilateral lower leg edema for over a week.  She has been having orthopnea as well.    Work-up in the ER shows CTA chest significant for no PE, bilateral atelectasis right greater than left with large right pleural effusion and moderate left pleural effusion, diffuse osseous and hepatic metastasis.    Patient husband reports that she  has been going downhill for the past 2 weeks with significant decrease in activity tolerance.    We were asked to admit for work up and evaluation of the above problems.     Past Medical History:   Diagnosis Date    Breast cancer (HCC)     Cardiomyopathy (HCC)     EF10%; 65% 2022    Malignant pleural effusion     Orthostatic hypotension         Past Surgical History:   Procedure Laterality Date    CARDIAC DEFIBRILLATOR PLACEMENT Left 2012    CHOLECYSTECTOMY      JOINT REPLACEMENT  Bilateral     MASTECTOMY, BILATERAL         Social History     Tobacco Use    Smoking status: Never    Smokeless tobacco: Not on file   Substance Use Topics    Alcohol use: Not Currently        Family History   Problem Relation Age of Onset    Heart Disease Mother     Breast Cancer Mother     Heart Disease Father      No Known Allergies     Prior to Admission medications    Medication Sig Start Date End Date Taking? Authorizing Provider   predniSONE (DELTASONE) 10 MG tablet Take 1 tablet by mouth daily   Yes Historical Provider, MD   midodrine (PROAMATINE) 2.5 MG tablet Take 1 tablet by mouth 2 times daily   Yes Historical Provider, MD   spironolactone (ALDACTONE) 25 MG tablet Take 1 tablet by mouth daily   Yes Historical Provider, MD   carvedilol (COREG) 12.5 MG tablet Take 0.5 tablets by mouth 2 times daily (with meals)   Yes Historical Provider, MD   capecitabine (XELODA) 500 MG chemo tablet Take 2 tablets by mouth 2 times daily   Yes Historical Provider, MD         Objective:   VITALS:    Patient Vitals for the past 24 hrs:   BP Temp Temp src Pulse Resp SpO2 Height Weight   10/07/21 2000 130/81 -- -- (!) 112 (!) 37 98 % -- --   10/07/21 1930 131/73 -- -- (!) 112 (!) 31 96 % -- --   10/07/21 1915 132/71 -- -- (!) 105 (!) 35 97 % -- --   10/07/21 1900 (!) 141/79 -- -- (!) 106 (!) 34 -- -- --   10/07/21 1845 135/75 -- -- (!) 104 27 97 % -- --   10/07/21 1829 127/78 -- -- -- -- -- -- --   10/07/21 1817 127/78 98.1 F (36.7 C) Oral (!) 102 18 98 % -- --   10/07/21 1526 126/85 97.3 F (36.3 C) Oral (!) 109 18 100 % 1.676 m (5\' 6" ) 63.5 kg (140 lb)       Temp (24hrs), Avg:97.7 F (36.5 C), Min:97.3 F (36.3 C), Max:98.1 F (36.7 C)           Wt Readings from Last 12 Encounters:   10/07/21 63.5 kg (140 lb)     Body mass index is 22.6 kg/m.    PHYSICAL EXAM:    General:    Alert, cooperative, no distress, chronically ill-appearing     HEENT: Atraumatic, anicteric sclerae, pink conjunctivae     No oral ulcers,  mucosa moist, throat clear.  Hearing intact.  Neck:  Supple, symmetrical,  thyroid: non tenderappears stated age.Marland Kitchen  No JVD  Lungs:  Decreased BS in lower 1/2 right lung, clear on left. No Wheezing or Rhonchi. No rales.  Chest wall:  No tenderness  No Accessory muscle use.  Heart:   Regular  rhythm, tachycardic heart rate 110-120s on telemetry, no  murmur   No gallop.  Trace pitting edema lower legs.    Abdomen:   Soft, non-tender. Not distended.  Bowel sounds normal. No masses  Extremities: No cyanosis.  No clubbing  Skin:     Normal skin turgor, Not pale, Not Jaundiced  No rashes     Psych:  Good insight.  Not depressed.  Not anxious or agitated.  Neurologic: EOMs intact. No facial asymmetry. No aphasia or slurred speech. Symmetrical strength, Alert and oriented X 3.   Peripheral pulse: Bilateral, DP, 2+  Capillary refill:  exam deferred nail polish in place        LAB DATA REVIEWED:    Recent Results (from the past 12 hour(s))   EKG 12 Lead    Collection Time: 10/07/21  3:33 PM   Result Value Ref Range    Ventricular Rate 112 BPM    Atrial Rate 112 BPM    P-R Interval 98 ms    QRS Duration 108 ms    Q-T Interval 378 ms    QTc Calculation (Bazett) 515 ms    P Axis 39 degrees    R Axis -71 degrees    T Axis 32 degrees    Diagnosis       ** Poor data quality, interpretation may be adversely affected  Atrial-sensed ventricular-paced rhythm  When compared with ECG of 14-Mar-2021 19:20,  Vent. rate has increased BY  25 BPM     CBC with Auto Differential    Collection Time: 10/07/21  3:39 PM   Result Value Ref Range    WBC 11.7 (H) 3.6 - 11.0 K/uL    RBC 3.95 3.80 - 5.20 M/uL    Hemoglobin 13.5 11.5 - 16.0 g/dL    Hematocrit 62.1 30.8 - 47.0 %    MCV 105.3 (H) 80.0 - 99.0 FL    MCH 34.2 (H) 26.0 - 34.0 PG    MCHC 32.5 30.0 - 36.5 g/dL    RDW 65.7 (H) 84.6 - 14.5 %    Platelets 295 150 - 400 K/uL    MPV 10.2 8.9 - 12.9 FL    Nucleated RBCs 0.0 0 PER 100 WBC    nRBC 0.00 0.00 - 0.01 K/uL    Neutrophils % 87 (H) 32 - 75 %     Lymphocytes % 7 (L) 12 - 49 %    Monocytes % 4 (L) 5 - 13 %    Eosinophils % 1 0 - 7 %    Basophils % 0 0 - 1 %    Immature Granulocytes 1 (H) 0.0 - 0.5 %    Neutrophils Absolute 10.2 (H) 1.8 - 8.0 K/UL    Lymphocytes Absolute 0.8 0.8 - 3.5 K/UL    Monocytes Absolute 0.5 0.0 - 1.0 K/UL    Eosinophils Absolute 0.1 0.0 - 0.4 K/UL    Basophils Absolute 0.0 0.0 - 0.1 K/UL    Absolute Immature Granulocyte 0.1 (H) 0.00 - 0.04 K/UL    Differential Type SMEAR SCANNED      RBC Comment ANISOCYTOSIS  1+        RBC Comment MACROCYTOSIS  1+       Comprehensive Metabolic Panel    Collection Time: 10/07/21  3:39 PM  Result Value Ref Range    Sodium 138 136 - 145 mmol/L    Potassium 4.2 3.5 - 5.1 mmol/L    Chloride 103 97 - 108 mmol/L    CO2 28 21 - 32 mmol/L    Anion Gap 7 5 - 15 mmol/L    Glucose 127 (H) 65 - 100 mg/dL    BUN 20 6 - 20 MG/DL    Creatinine 4.13 (H) 0.55 - 1.02 MG/DL    Bun/Cre Ratio 18 12 - 20      Est, Glom Filt Rate 54 (L) >60 ml/min/1.44m2    Calcium 10.1 8.5 - 10.1 MG/DL    Total Bilirubin 0.9 0.2 - 1.0 MG/DL    ALT 50 12 - 78 U/L    AST 41 (H) 15 - 37 U/L    Alk Phosphatase 180 (H) 45 - 117 U/L    Total Protein 7.1 6.4 - 8.2 g/dL    Albumin 3.3 (L) 3.5 - 5.0 g/dL    Globulin 3.8 2.0 - 4.0 g/dL    Albumin/Globulin Ratio 0.9 (L) 1.1 - 2.2     Troponin    Collection Time: 10/07/21  3:39 PM   Result Value Ref Range    Troponin, High Sensitivity 22 0 - 51 ng/L   Brain Natriuretic Peptide    Collection Time: 10/07/21  3:39 PM   Result Value Ref Range    NT Pro-BNP 489 (H) <125 PG/ML         CT Result (most recent):  CTA CHEST W WO CONTRAST 10/07/2021    Narrative  EXAM:  CTA CHEST W WO CONTRAST    INDICATION: Shortness of breath, metastatic breast cancer    COMPARISON: CT December 2022    TECHNIQUE: Helical thin section chest CT following uneventful intravenous  administration of nonionic contrast 100 mL of isovue 370 according to  departmental PE protocol. Coronal and sagittal reformats were performed. 3D  post  processing was performed.  CT dose reduction was achieved through the use of a  standardized protocol tailored for this examination and automatic exposure  control for dose modulation.    FINDINGS: This is a good quality study for the evaluation of pulmonary embolism  to the first subsegmental arterial level. There is no pulmonary embolism to this  level.    Pacer device is present in the left chest.  THYROID: No nodule.  MEDIASTINUM: No mass or lymphadenopathy.  HILA: No mass or lymphadenopathy.  THORACIC AORTA: No aneurysm.  HEART: Normal in size.  ESOPHAGUS: No wall thickening or dilatation.  TRACHEA/BRONCHI: Patent.  PLEURA: Large right pleural effusion, increased in volume. Moderate left pleural  effusion, similar to prior study.  LUNGS: Complete collapse of the right lower lobe. Mild left basilar atelectasis.  UPPER ABDOMEN: Multiple ill-defined liver lesions, consistent with diffuse  hepatic metastatic disease. The liver is also surrounding it.  BONES: Diffuse osseous metastatic disease. This is similar to prior study.    Impression  1. No evidence of pulmonary embolism.  2. Large right pleural effusion, increased in size.  3. Moderate left pleural effusion, not significantly changed.  4. Bilateral lower lobe atelectasis, right worse than left.  5. Diffuse osseous and hepatic metastatic disease.        Xray Result (most recent):  XR CHEST PORTABLE 10/07/2021    Narrative  INDICATION:  SOB    COMPARISON: December 2022    FINDINGS: Single AP portable view of the chest obtained at 1748 demonstrates a  large right and moderate left pleural effusion, with bibasilar atelectasis,  right worse than left. There is cardiomegaly and a left-sided pacer device.    Impression  Bilateral pleural effusions, right greater than left.         _______________________________________________________________________    TOTAL TIME:  60 Minutes       Signed:Tully Burgo Cyndie Chime, MD    Procedures: see electronic medical records for all  procedures/Xrays and details which were not copied into this note but were reviewed prior to creation of Plan.

## 2021-10-07 NOTE — H&P (Incomplete Revision)
Hospitalist Admission Note    NAME:  Ariel Day   DOB:  May 01, 1945   MRN:  [id number]     Date/Time:  10/07/2021 8:29 PM    Patient PCP: No primary care provider on file.    Please note that this dictation was completed with Dragon, the computer voice recognition software.  Quite often unanticipated grammatical, syntax, homophones, and other interpretive errors are inadvertently transcribed by the computer software.  Please disregard these errors.  Please excuse any errors that have escaped final proofreading  ______________________________________________________________________  Given the patient's current clinical presentation, I have a high level of concern for decompensation if discharged from the emergency department.  Complex decision making was performed, which includes reviewing the patient's available past medical records, laboratory results, and x-ray films.       My assessment of this patient's clinical condition and my plan of care is as follows.    Assessment / Plan:    Active Problems:  Recurrent large right malignant pleural effusion and moderate left pleural effusion, POA  --not hypoxic at rest but severe dyspnea RR 31  --consult IR for diagnostic and therapeutic thoracentesis in AM, Called and discussed with Dr. Lamont Snowball.  NPO p MN.  --should discuss with her oncologist about potential thoracic surgery referral for pleurodesis vs. Pleurx catheter as this would be the third time she requires thoracentesis, last done 07/2020 at [facility] with removal of 747m    Metastatic breast cancer with mets to liver and bone, POA dx 5 years ago  --cont xeloda (1 dose tonight; pt to provide) on regimen every other week  [place].  Refer to oncology group at discharge    Orthostatic hypotension  --cont midodrine bid  --cont prednisone 126mdaily    Chronic diastolic chf, hx of CHFrEF s/p ICD  --cont aldactone 2554maily  --give  lasix 10m73m x 1    Medical Decision Making:   I personally reviewed labs: Yes, as listed below  I personally reviewed imaging: CXR, large right pleural effusion occupying at least 1/2 lower lung  Toxic drug monitoring: n/a  Discussed case with: ED provider. After discussion I am in agreement that acuity of patient's medical condition necessitates hospital stay.      Code Status: dw pt, wants to be full code  DVT Prophylaxis: SCD; put on lovenox after thoracentesis  GI Prophylaxis: none  Baseline: use walker when outside, cane inside home    Subjective:   CHIEF COMPLAINT: DOE    HISTORY OF PRESENT ILLNESS:     Ariel Day 75 y59.  female with PMHx metastatic breast cancer with mets to bone and liver diagnosed 5 years ago, history of CHF with reduced EF EF 10% initially status post ICD with latest EF being 65% per patient, history of malignant pleural effusion and [facility] with removal of 700 mL presents with 2 days of shortness of breath just walking 4 to 5 feet, mild nonproductive cough.  Denies any fevers or chills.  She reports increased bilateral lower leg edema for over a week.  She has been having orthopnea as well.    Work-up in the ER shows CTA chest significant for no PE, bilateral atelectasis right greater than left with large right pleural effusion and moderate left pleural effusion, diffuse osseous and hepatic metastasis.    Patient husband reports that she has  been going downhill for the past 2 weeks with significant decrease in activity tolerance.    We were asked to admit for work up and evaluation of the above problems.     Past Medical History:   Diagnosis Date    Breast cancer ([place])     Cardiomyopathy ([place])     EF10%; 65% 2022    Malignant pleural effusion     Orthostatic hypotension         Past Surgical History:   Procedure Laterality Date    CARDIAC DEFIBRILLATOR PLACEMENT Left 2012    CHOLECYSTECTOMY      JOINT REPLACEMENT Bilateral      MASTECTOMY, BILATERAL         Social History     Tobacco Use    Smoking status: Never    Smokeless tobacco: Not on file   Substance Use Topics    Alcohol use: Not Currently        Family History   Problem Relation Age of Onset    Heart Disease Mother     Breast Cancer Mother     Heart Disease Father      No Known Allergies     Prior to Admission medications    Medication Sig Start Date End Date Taking? Authorizing Provider   predniSONE (DELTASONE) 10 MG tablet Take 1 tablet by mouth daily   Yes Historical Provider, MD   midodrine (PROAMATINE) 2.5 MG tablet Take 1 tablet by mouth 2 times daily   Yes Historical Provider, MD   spironolactone (ALDACTONE) 25 MG tablet Take 1 tablet by mouth daily   Yes Historical Provider, MD   carvedilol (COREG) 12.5 MG tablet Take 0.5 tablets by mouth 2 times daily (with meals)   Yes Historical Provider, MD   capecitabine (XELODA) 500 MG chemo tablet Take 2 tablets by mouth 2 times daily   Yes Historical Provider, MD         Objective:   VITALS:    Patient Vitals for the past 24 hrs:   BP Temp Temp src Pulse Resp SpO2 Height Weight   10/07/21 2000 130/81 -- -- (!) 112 (!) 37 98 % -- --   10/07/21 1930 131/73 -- -- (!) 112 (!) 31 96 % -- --   10/07/21 1915 132/71 -- -- (!) 105 (!) 35 97 % -- --   10/07/21 1900 (!) 141/79 -- -- (!) 106 (!) 34 -- -- --   10/07/21 1845 135/75 -- -- (!) 104 27 97 % -- --   10/07/21 1829 127/78 -- -- -- -- -- -- --   10/07/21 1817 127/78 98.1 F (36.7 C) Oral (!) 102 18 98 % -- --   10/07/21 1526 126/85 97.3 F (36.3 C) Oral (!) 109 18 100 % 1.676 m ('5\' 6"' ) 63.5 kg (140 lb)       Temp (24hrs), Avg:97.7 F (36.5 C), Min:97.3 F (36.3 C), Max:98.1 F (36.7 C)           Wt Readings from Last 12 Encounters:   10/07/21 63.5 kg (140 lb)     Body mass index is 22.6 kg/m.    PHYSICAL EXAM:    General:    Alert, cooperative, no distress, chronically ill-appearing     HEENT: Atraumatic, anicteric sclerae, pink conjunctivae     No oral ulcers, mucosa moist,  throat clear.  Hearing intact.  Neck:  Supple, symmetrical,  thyroid: non tenderappears stated age.Marland Kitchen  No JVD  Lungs:   Decreased  BS in lower 1/2 right lung, clear on left. No Wheezing or Rhonchi. No rales.  Chest wall:  No tenderness  No Accessory muscle use.  Heart:   Regular  rhythm, tachycardic heart rate 110-120s on telemetry, no  murmur   No gallop.  Trace pitting edema lower legs.    Abdomen:   Soft, non-tender. Not distended.  Bowel sounds normal. No masses  Extremities: No cyanosis.  No clubbing  Skin:     Normal skin turgor, Not pale, Not Jaundiced  No rashes     Psych:  Good insight.  Not depressed.  Not anxious or agitated.  Neurologic: EOMs intact. No facial asymmetry. No aphasia or slurred speech. Symmetrical strength, Alert and oriented X 3.   Peripheral pulse: Bilateral, DP, 2+  Capillary refill:  exam deferred nail polish in place        LAB DATA REVIEWED:    Recent Results (from the past 12 hour(s))   EKG 12 Lead    Collection Time: 10/07/21  3:33 PM   Result Value Ref Range    Ventricular Rate 112 BPM    Atrial Rate 112 BPM    P-R Interval 98 ms    QRS Duration 108 ms    Q-T Interval 378 ms    QTc Calculation (Bazett) 515 ms    P Axis 39 degrees    R Axis -71 degrees    T Axis 32 degrees    Diagnosis       ** Poor data quality, interpretation may be adversely affected  Atrial-sensed ventricular-paced rhythm  When compared with ECG of 14-Mar-2021 19:20,  Vent. rate has increased BY  25 BPM     CBC with Auto Differential    Collection Time: 10/07/21  3:39 PM   Result Value Ref Range    WBC 11.7 (H) 3.6 - 11.0 K/uL    RBC 3.95 3.80 - 5.20 M/uL    Hemoglobin 13.5 11.5 - 16.0 g/dL    Hematocrit 41.6 35.0 - 47.0 %    MCV 105.3 (H) 80.0 - 99.0 FL    MCH 34.2 (H) 26.0 - 34.0 PG    MCHC 32.5 30.0 - 36.5 g/dL    RDW 17.9 (H) 11.5 - 14.5 %    Platelets 295 150 - 400 K/uL    MPV 10.2 8.9 - 12.9 FL    Nucleated RBCs 0.0 0 PER 100 WBC    nRBC 0.00 0.00 - 0.01 K/uL    Neutrophils % 87 (H) 32 - 75 %    Lymphocytes  % 7 (L) 12 - 49 %    Monocytes % 4 (L) 5 - 13 %    Eosinophils % 1 0 - 7 %    Basophils % 0 0 - 1 %    Immature Granulocytes 1 (H) 0.0 - 0.5 %    Neutrophils Absolute 10.2 (H) 1.8 - 8.0 K/UL    Lymphocytes Absolute 0.8 0.8 - 3.5 K/UL    Monocytes Absolute 0.5 0.0 - 1.0 K/UL    Eosinophils Absolute 0.1 0.0 - 0.4 K/UL    Basophils Absolute 0.0 0.0 - 0.1 K/UL    Absolute Immature Granulocyte 0.1 (H) 0.00 - 0.04 K/UL    Differential Type SMEAR SCANNED      RBC Comment ANISOCYTOSIS  1+        RBC Comment MACROCYTOSIS  1+       Comprehensive Metabolic Panel    Collection Time: 10/07/21  3:39 PM  Result Value Ref Range    Sodium 138 136 - 145 mmol/L    Potassium 4.2 3.5 - 5.1 mmol/L    Chloride 103 97 - 108 mmol/L    CO2 28 21 - 32 mmol/L    Anion Gap 7 5 - 15 mmol/L    Glucose 127 (H) 65 - 100 mg/dL    BUN 20 6 - 20 MG/DL    Creatinine 1.09 (H) 0.55 - 1.02 MG/DL    Bun/Cre Ratio 18 12 - 20      Est, Glom Filt Rate 54 (L) >60 ml/min/1.26m    Calcium 10.1 8.5 - 10.1 MG/DL    Total Bilirubin 0.9 0.2 - 1.0 MG/DL    ALT 50 12 - 78 U/L    AST 41 (H) 15 - 37 U/L    Alk Phosphatase 180 (H) 45 - 117 U/L    Total Protein 7.1 6.4 - 8.2 g/dL    Albumin 3.3 (L) 3.5 - 5.0 g/dL    Globulin 3.8 2.0 - 4.0 g/dL    Albumin/Globulin Ratio 0.9 (L) 1.1 - 2.2     Troponin    Collection Time: 10/07/21  3:39 PM   Result Value Ref Range    Troponin, High Sensitivity 22 0 - 51 ng/L   Brain Natriuretic Peptide    Collection Time: 10/07/21  3:39 PM   Result Value Ref Range    NT Pro-BNP 489 (H) <125 PG/ML         CT Result (most recent):  CTA CHEST W WO CONTRAST 10/07/2021    Narrative  EXAM:  CTA CHEST W WO CONTRAST    INDICATION: Shortness of breath, metastatic breast cancer    COMPARISON: CT December 2022    TECHNIQUE: Helical thin section chest CT following uneventful intravenous  administration of nonionic contrast 100 mL of isovue 370 according to  departmental PE protocol. Coronal and sagittal reformats were performed. 3D post  processing  was performed.  CT dose reduction was achieved through the use of a  standardized protocol tailored for this examination and automatic exposure  control for dose modulation.    FINDINGS: This is a good quality study for the evaluation of pulmonary embolism  to the first subsegmental arterial level. There is no pulmonary embolism to this  level.    Pacer device is present in the left chest.  THYROID: No nodule.  MEDIASTINUM: No mass or lymphadenopathy.  HILA: No mass or lymphadenopathy.  THORACIC AORTA: No aneurysm.  HEART: Normal in size.  ESOPHAGUS: No wall thickening or dilatation.  TRACHEA/BRONCHI: Patent.  PLEURA: Large right pleural effusion, increased in volume. Moderate left pleural  effusion, similar to prior study.  LUNGS: Complete collapse of the right lower lobe. Mild left basilar atelectasis.  UPPER ABDOMEN: Multiple ill-defined liver lesions, consistent with diffuse  hepatic metastatic disease. The liver is also surrounding it.  BONES: Diffuse osseous metastatic disease. This is similar to prior study.    Impression  1. No evidence of pulmonary embolism.  2. Large right pleural effusion, increased in size.  3. Moderate left pleural effusion, not significantly changed.  4. Bilateral lower lobe atelectasis, right worse than left.  5. Diffuse osseous and hepatic metastatic disease.        Xray Result (most recent):  XR CHEST PORTABLE 10/07/2021    Narrative  INDICATION:  SOB    COMPARISON: December 2022    FINDINGS: Single AP portable view of the chest obtained at 1748 demonstrates a  large right and moderate left pleural effusion, with bibasilar atelectasis,  right worse than left. There is cardiomegaly and a left-sided pacer device.    Impression  Bilateral pleural effusions, right greater than left.         _______________________________________________________________________    TOTAL TIME:  60 Minutes       Signed:Jenyfer Trawick Alfonse Spruce, MD    Procedures: see electronic medical records for all procedures/Xrays  and details which were not copied into this note but were reviewed prior to creation of Plan.

## 2021-10-07 NOTE — ED Provider Notes (Incomplete)
[facility] EMERGENCY DEPT  EMERGENCY DEPARTMENT ENCOUNTER       Pt Name: Ariel Day  MRN: [id number]  Birthdate [birth year]  Date of Evaluation: 10/07/2021  Provider: Linwood Dibbles, PA   PCP: No primary care provider on file.  Note Started: 5:02 PM 10/07/21     CHIEF COMPLAINT       Chief Complaint   Patient presents with   . Shortness of Breath     Patient to triage w a h/o POTS patient reports acute onset of SOB 2 days ago.        HISTORY OF PRESENT ILLNESS: 1 or more elements      History From: Patient  None     Ariel Day is a 76 y.o. female with a history of POTS, CHF, stage IV metastatic breast cancer who presents to the ED with shortness of breath.  This began 3 days ago.  She has been feeling very short of breath when she gets up to go to the bathroom at night.  She has maybe felt little bit nauseous and had a mild cough but otherwise not had any pain or any other symptoms.  No fevers, chest pain, dizziness, vomiting, abdominal pain, diarrhea, bloody stools, lower extremity swelling, urinary changes or rash.     Nursing Notes were all reviewed and agreed with or any disagreements were addressed in the HPI.     REVIEW OF SYSTEMS      Review of Systems     Positives and Pertinent negatives as per HPI.    PAST HISTORY     Past Medical History:  No past medical history on file.    Past Surgical History:  No past surgical history on file.    Family History:  No family history on file.    Social History:       Allergies:  No Known Allergies    CURRENT MEDICATIONS      Previous Medications    CAPECITABINE (XELODA) 500 MG CHEMO TABLET    Take 1,250 mg/m2 by mouth 2 times daily    CARVEDILOL (COREG) 12.5 MG TABLET    Take 0.5 tablets by mouth 2 times daily (with meals)    MIDODRINE (PROAMATINE) 2.5 MG TABLET    Take 1 tablet by mouth 3 times daily    PREDNISONE (DELTASONE) 10 MG TABLET    Take 1 tablet by mouth daily    SPIRONOLACTONE (ALDACTONE) 25 MG TABLET    Take 1 tablet by mouth daily       PHYSICAL EXAM      Vitals:     10/07/21 1526   BP: 126/85   Pulse: (!) 109   Resp: 18   Temp: 97.3 F (36.3 C)   TempSrc: Oral   SpO2: 100%   Weight: 63.5 kg (140 lb)   Height: 1.676 m (5\' 6" )       Physical Exam  Vitals and nursing note reviewed.   Constitutional:       General: She is not in acute distress.     Appearance: Normal appearance. She is not ill-appearing, toxic-appearing or diaphoretic.      Comments: Well-appearing 76 year old female resting comfortably in the stretcher   HENT:      Head: Normocephalic.      Right Ear: External ear normal.      Left Ear: External ear normal.      Nose: Nose normal. No congestion or rhinorrhea.      Mouth/Throat:  Mouth: Mucous membranes are moist.   Eyes:      Extraocular Movements: Extraocular movements intact.      Conjunctiva/sclera: Conjunctivae normal.      Pupils: Pupils are equal, round, and reactive to light.   Cardiovascular:      Rate and Rhythm: Regular rhythm. Tachycardia present.      Pulses: Normal pulses.      Comments: No JVD.  Lower extremities show scant swelling but no pitting edema.  Pulmonary:      Effort: Pulmonary effort is normal.      Breath sounds: Normal breath sounds.      Comments: Patient speaking in full sentences with no accessory muscle use, increased work of breathing ,or signs of respiratory distress.  Good breath sounds to bases bilaterally.  No wheezing, rhonchi, crackles or stridor.       Abdominal:      General: Abdomen is flat.      Palpations: Abdomen is soft.   Musculoskeletal:         General: No swelling. Normal range of motion.      Cervical back: Normal range of motion and neck supple.   Skin:     General: Skin is warm.      Capillary Refill: Capillary refill takes less than 2 seconds.      Findings: No erythema or rash.   Neurological:      General: No focal deficit present.      Mental Status: She is alert and oriented to person, place, and time. Mental status is at baseline.   Psychiatric:         Mood and Affect: Mood normal.          Behavior: Behavior normal.         Thought Content: Thought content normal.         Judgment: Judgment normal.        DIAGNOSTIC RESULTS   LABS:     Recent Results (from the past 12 hour(s))   EKG 12 Lead    Collection Time: 10/07/21  3:33 PM   Result Value Ref Range    Ventricular Rate 112 BPM    Atrial Rate 112 BPM    P-R Interval 98 ms    QRS Duration 108 ms    Q-T Interval 378 ms    QTc Calculation (Bazett) 515 ms    P Axis 39 degrees    R Axis -71 degrees    T Axis 32 degrees    Diagnosis       ** Poor data quality, interpretation may be adversely affected  Atrial-sensed ventricular-paced rhythm  When compared with ECG of 14-Mar-2021 19:20,  Vent. rate has increased BY  25 BPM     CBC with Auto Differential    Collection Time: 10/07/21  3:39 PM   Result Value Ref Range    WBC 11.7 (H) 3.6 - 11.0 K/uL    RBC 3.95 3.80 - 5.20 M/uL    Hemoglobin 13.5 11.5 - 16.0 g/dL    Hematocrit 16.1 09.6 - 47.0 %    MCV 105.3 (H) 80.0 - 99.0 FL    MCH 34.2 (H) 26.0 - 34.0 PG    MCHC 32.5 30.0 - 36.5 g/dL    RDW 04.5 (H) 40.9 - 14.5 %    Platelets 295 150 - 400 K/uL    MPV 10.2 8.9 - 12.9 FL    Nucleated RBCs 0.0 0 PER 100 WBC    nRBC 0.00  0.00 - 0.01 K/uL    Neutrophils % 87 (H) 32 - 75 %    Lymphocytes % 7 (L) 12 - 49 %    Monocytes % 4 (L) 5 - 13 %    Eosinophils % 1 0 - 7 %    Basophils % 0 0 - 1 %    Immature Granulocytes 1 (H) 0.0 - 0.5 %    Neutrophils Absolute 10.2 (H) 1.8 - 8.0 K/UL    Lymphocytes Absolute 0.8 0.8 - 3.5 K/UL    Monocytes Absolute 0.5 0.0 - 1.0 K/UL    Eosinophils Absolute 0.1 0.0 - 0.4 K/UL    Basophils Absolute 0.0 0.0 - 0.1 K/UL    Absolute Immature Granulocyte 0.1 (H) 0.00 - 0.04 K/UL    Differential Type SMEAR SCANNED      RBC Comment ANISOCYTOSIS  1+        RBC Comment MACROCYTOSIS  1+       Comprehensive Metabolic Panel    Collection Time: 10/07/21  3:39 PM   Result Value Ref Range    Sodium 138 136 - 145 mmol/L    Potassium 4.2 3.5 - 5.1 mmol/L    Chloride 103 97 - 108 mmol/L    CO2 28 21 - 32  mmol/L    Anion Gap 7 5 - 15 mmol/L    Glucose 127 (H) 65 - 100 mg/dL    BUN 20 6 - 20 MG/DL    Creatinine 1.32 (H) 0.55 - 1.02 MG/DL    Bun/Cre Ratio 18 12 - 20      Est, Glom Filt Rate 54 (L) >60 ml/min/1.39m2    Calcium 10.1 8.5 - 10.1 MG/DL    Total Bilirubin 0.9 0.2 - 1.0 MG/DL    ALT 50 12 - 78 U/L    AST 41 (H) 15 - 37 U/L    Alk Phosphatase 180 (H) 45 - 117 U/L    Total Protein 7.1 6.4 - 8.2 g/dL    Albumin 3.3 (L) 3.5 - 5.0 g/dL    Globulin 3.8 2.0 - 4.0 g/dL    Albumin/Globulin Ratio 0.9 (L) 1.1 - 2.2     Troponin    Collection Time: 10/07/21  3:39 PM   Result Value Ref Range    Troponin, High Sensitivity 22 0 - 51 ng/L   Brain Natriuretic Peptide    Collection Time: 10/07/21  3:39 PM   Result Value Ref Range    NT Pro-BNP 489 (H) <125 PG/ML       EKG: When ordered, EKG's are interpreted by the Emergency Department Physician in the absence of a cardiologist.  Please see their note for interpretation of EKG.      RADIOLOGY:  Non-plain film images such as CT, Ultrasound and MRI are read by the radiologist. Plain radiographic images are visualized and preliminarily interpreted by the ED Provider with the below findings:      Chest x-ray does show a right-sided pleural effusion, worsening from interval chest x-ray in December 2022.  Otherwise no acute changes.  There is a very mild, chronically stable left-sided pleural effusion.  No cardiomegaly or widened mediastinum.  Trachea midline.     Interpretation per the Radiologist below, if available at the time of this note:     XR CHEST PORTABLE    (Results Pending)   CTA CHEST W WO CONTRAST    (Results Pending)        PROCEDURES   Unless otherwise noted below,  none  Procedures     CRITICAL CARE TIME       EMERGENCY DEPARTMENT COURSE and DIFFERENTIAL DIAGNOSIS/MDM   Vitals:    Vitals:    10/07/21 1526   BP: 126/85   Pulse: (!) 109   Resp: 18   Temp: 97.3 F (36.3 C)   TempSrc: Oral   SpO2: 100%   Weight: 63.5 kg (140 lb)   Height: 1.676 m (5\' 6" )        Patient  was given the following medications:  Medications   0.9 % sodium chloride bolus (has no administration in time range)       CONSULTS: (Who and What was discussed)  None    Chronic Conditions: Metastatic breast cancer, CHF, POTS    Social Determinants affecting Dx or Tx: None    Records Reviewed (source and summary of external records): Nursing Notes and Old Medical Records    MDM: CC/HPI Summary, DDx, ED Course, and Reassessment. Disposition Considerations (Tests not done, Shared Decision Making, Pt Expectation of Test or Tx.):   Patient was a 76 year old female with a history of metastatic breast cancer, CHF who presented to the ED with a 3-day history of dyspnea on exertion.  She presented to ED afebrile, nontoxic with no signs of respiratory distress hypoxia however was tachycardic to the 100s.  Differential diagnosis includes but not limited to, PE, pneumonia, ACS, CHF, electrolyte disturbance, anemia, dysrhythmia.  EKG showed no ischemic changes and troponin was within normal limits.  She is not actively having any chest pain or other atypical ACS symptoms.  Blood work does show a mild leukocytosis of 11.7, however patient does take prednisone daily and does not have any infectious symptoms.  She otherwise did not appear systemically ill or septic.  She denies any fevers cough or features concerning otherwise for pneumonia.  CTA did show a large right-sided pleural effusion which is likely secondary to her malignancy.  This has appeared on the prior chest x-ray in December 2022 but is increased in size.  Patient was unable to ambulate secondary to her dyspnea as well as weakness.  Patient was ultimately admitted to hospitalist service for further care and evaluation    ED Course as of 10/07/21 1922   Fri Oct 07, 2021   1700 Patient evaluated for 3-day history of shortness of breath/dyspnea exertion in the setting of metastatic breast cancer.  She presents to the ED well-appearing, no signs of respiratory  distress however is tachycardic.  She does not have any active pain.  Differential diagnosis includes but not limited to, PE, ACS, anemia, electrolyte disturbance, CHF.  Labs in triage are reassuring and that her BNP is not remarkably elevated and she does not appear volume overloaded.  She has a mild leukocytosis but does take prednisone daily.  This appears to be chronic.  No anemia or other concerning electrolyte derangement.  Will obtain CTA and portable chest x-ray.  We will give conservative 500 mL fluids and reassess. [AJ]   1819 CTA with no PE.  She does have a significantly enlarged and right pleural effusion.  Left pleural effusion is unchanged.  On reevaluation patient is resting calmly in the bed.  We will ambulate the patient and check orthostatic/ambulatory vital signs. [AJ]   1836 Nursing staff did attempt to ambulate the patient but she is too weak and dyspneic to get out of bed.  She did become orthostatic as well.  Case discussed with hospitalist, Dr. Shirlyn Goltz, who was informed  the patient's history, presentation and work-up.  He said he would come evaluate the patient for possible admission. [AJ]      ED Course User Index  [AJ] Linwood Dibbles, PA         FINAL IMPRESSION   No diagnosis found.      DISPOSITION/PLAN   DISPOSITION        {Disposition (Optional):57538}     PATIENT REFERRED TO:  No follow-up provider specified.     DISCHARGE MEDICATIONS:     Medication List        ASK your doctor about these medications      capecitabine 500 MG chemo tablet  Commonly known as: XELODA     carvedilol 12.5 MG tablet  Commonly known as: COREG     midodrine 2.5 MG tablet  Commonly known as: PROAMATINE     predniSONE 10 MG tablet  Commonly known as: DELTASONE     spironolactone 25 MG tablet  Commonly known as: ALDACTONE                DISCONTINUED MEDICATIONS:  Current Discharge Medication List          {ED Attending Involvement (Optional):57539}    I am the Primary Clinician of Record.   Linwood Dibbles, PA  (electronically signed)    (Please note that parts of this dictation were completed with voice recognition software. Quite often unanticipated grammatical, syntax, homophones, and other interpretive errors are inadvertently transcribed by the computer software. Please disregards these errors. Please excuse any errors that have escaped final proofreading.)

## 2021-10-08 ENCOUNTER — Inpatient Hospital Stay: Admit: 2021-10-08 | Payer: MEDICARE | Primary: Family Medicine

## 2021-10-08 LAB — APTT: PTT: 23.1 s (ref 22.1–31.0)

## 2021-10-08 LAB — CBC WITH AUTO DIFFERENTIAL
Absolute Immature Granulocyte: 0.1 10*3/uL — ABNORMAL HIGH (ref 0.00–0.04)
Basophils %: 0 % (ref 0–1)
Basophils Absolute: 0 10*3/uL (ref 0.0–0.1)
Eosinophils %: 1 % (ref 0–7)
Eosinophils Absolute: 0.1 10*3/uL (ref 0.0–0.4)
Hematocrit: 39.3 % (ref 35.0–47.0)
Hemoglobin: 13 g/dL (ref 11.5–16.0)
Immature Granulocytes: 1 % — ABNORMAL HIGH (ref 0.0–0.5)
Lymphocytes %: 14 % (ref 12–49)
Lymphocytes Absolute: 1.4 10*3/uL (ref 0.8–3.5)
MCH: 34.4 PG — ABNORMAL HIGH (ref 26.0–34.0)
MCHC: 33.1 g/dL (ref 30.0–36.5)
MCV: 104 FL — ABNORMAL HIGH (ref 80.0–99.0)
MPV: 10.1 FL (ref 8.9–12.9)
Monocytes %: 8 % (ref 5–13)
Monocytes Absolute: 0.9 10*3/uL (ref 0.0–1.0)
Neutrophils %: 76 % — ABNORMAL HIGH (ref 32–75)
Neutrophils Absolute: 7.8 10*3/uL (ref 1.8–8.0)
Nucleated RBCs: 0 PER 100 WBC
Platelets: 245 10*3/uL (ref 150–400)
RBC: 3.78 M/uL — ABNORMAL LOW (ref 3.80–5.20)
RDW: 17.7 % — ABNORMAL HIGH (ref 11.5–14.5)
WBC: 10.2 10*3/uL (ref 3.6–11.0)
nRBC: 0 10*3/uL (ref 0.00–0.01)

## 2021-10-08 LAB — EKG 12-LEAD
Atrial Rate: 112 {beats}/min
P Axis: 39 degrees
P-R Interval: 98 ms
Q-T Interval: 378 ms
QRS Duration: 108 ms
QTc Calculation (Bazett): 515 ms
R Axis: -71 degrees
T Axis: 32 degrees
Ventricular Rate: 112 {beats}/min

## 2021-10-08 LAB — BASIC METABOLIC PANEL W/ REFLEX TO MG FOR LOW K
Anion Gap: 8 mmol/L (ref 5–15)
BUN: 22 MG/DL — ABNORMAL HIGH (ref 6–20)
Bun/Cre Ratio: 24 — ABNORMAL HIGH (ref 12–20)
CO2: 28 mmol/L (ref 21–32)
Calcium: 9.7 MG/DL (ref 8.5–10.1)
Chloride: 102 mmol/L (ref 97–108)
Creatinine: 0.92 MG/DL (ref 0.55–1.02)
Est, Glom Filt Rate: >60 mL/min/{1.73_m2} (ref >60)
Glucose: 102 mg/dL — ABNORMAL HIGH (ref 65–100)
Potassium: 3.2 mmol/L — ABNORMAL LOW (ref 3.5–5.1)
Sodium: 138 mmol/L (ref 136–145)

## 2021-10-08 LAB — BODY FLUID CELL COUNT
Lymphocytes, Body Fluid: 6 % — AB
MESOTHELIAL: 39 % — AB
Mono/Macro, Fluid: 55 % — AB
RBC, Fluid: >100 /mm3 — ABNORMAL HIGH
Total Nucleated Cell Count: 2622 /mm3

## 2021-10-08 LAB — PROTIME-INR
INR: 1.1 (ref 0.9–1.1)
Protime: 11.1 s (ref 9.0–11.1)

## 2021-10-08 LAB — MAGNESIUM: Magnesium: 2.2 mg/dL (ref 1.6–2.4)

## 2021-10-08 MED ORDER — PREDNISONE 5 MG PO TABS
5 MG | Freq: Every day | ORAL | Status: AC
Start: 2021-10-08 — End: 2021-10-12
  Administered 2021-10-08 – 2021-10-12 (×5): 10 mg via ORAL

## 2021-10-08 MED ORDER — MORPHINE SULFATE (PF) 2 MG/ML IV SOLN
2 MG/ML | INTRAVENOUS | Status: AC | PRN
Start: 2021-10-08 — End: 2021-10-12
  Administered 2021-10-08: 22:00:00 1 mg via INTRAVENOUS

## 2021-10-08 MED ORDER — POTASSIUM CHLORIDE ER 10 MEQ PO TBCR
10 MEQ | Freq: Once | ORAL | Status: AC
Start: 2021-10-08 — End: 2021-10-08
  Administered 2021-10-08: 20:00:00 40 meq via ORAL

## 2021-10-08 MED ORDER — CARVEDILOL 6.25 MG PO TABS
6.25 MG | Freq: Two times a day (BID) | ORAL | Status: AC
Start: 2021-10-08 — End: 2021-10-10
  Administered 2021-10-08 – 2021-10-10 (×7): 6.25 mg via ORAL

## 2021-10-08 MED ORDER — POLYETHYLENE GLYCOL 3350 17 G PO PACK
17 g | Freq: Every day | ORAL | Status: AC | PRN
Start: 2021-10-08 — End: 2021-10-12

## 2021-10-08 MED ORDER — OXYCODONE HCL 5 MG PO TABS
5 | ORAL | Status: DC | PRN
Start: 2021-10-08 — End: 2021-10-12
  Administered 2021-10-08 – 2021-10-09 (×3): 5 mg via ORAL

## 2021-10-08 MED ORDER — ACETAMINOPHEN 650 MG RE SUPP
650 | Freq: Four times a day (QID) | RECTAL | Status: DC | PRN
Start: 2021-10-08 — End: 2021-10-12

## 2021-10-08 MED ORDER — TEARS AGAIN ADVANCED EYELID OP SOLN
OPHTHALMIC | Status: DC | PRN
Start: 2021-10-08 — End: 2021-10-12

## 2021-10-08 MED ORDER — ONDANSETRON 4 MG PO TBDP
4 MG | Freq: Three times a day (TID) | ORAL | Status: AC | PRN
Start: 2021-10-08 — End: 2021-10-12

## 2021-10-08 MED ORDER — NORMAL SALINE FLUSH 0.9 % IV SOLN
0.9 % | INTRAVENOUS | Status: AC | PRN
Start: 2021-10-08 — End: 2021-10-12

## 2021-10-08 MED ORDER — MIDODRINE HCL 5 MG PO TABS
5 MG | Freq: Two times a day (BID) | ORAL | Status: AC
Start: 2021-10-08 — End: 2021-10-10
  Administered 2021-10-08 – 2021-10-10 (×5): 2.5 mg via ORAL

## 2021-10-08 MED ORDER — ONDANSETRON HCL 4 MG/2ML IJ SOLN
4 MG/2ML | Freq: Four times a day (QID) | INTRAMUSCULAR | Status: AC | PRN
Start: 2021-10-08 — End: 2021-10-12

## 2021-10-08 MED ORDER — SPIRONOLACTONE 25 MG PO TABS
25 MG | Freq: Every day | ORAL | Status: AC
Start: 2021-10-08 — End: 2021-10-10
  Administered 2021-10-08 – 2021-10-10 (×3): 25 mg via ORAL

## 2021-10-08 MED ORDER — CAPECITABINE 500 MG PO TABS
500 MG | Freq: Two times a day (BID) | ORAL | Status: AC
Start: 2021-10-08 — End: 2021-10-07
  Administered 2021-10-08: 03:00:00 1000 mg via ORAL

## 2021-10-08 MED ORDER — NORMAL SALINE FLUSH 0.9 % IV SOLN
0.9 % | Freq: Two times a day (BID) | INTRAVENOUS | Status: AC
Start: 2021-10-08 — End: 2021-10-12
  Administered 2021-10-08 – 2021-10-11 (×8): 10 mL via INTRAVENOUS

## 2021-10-08 MED ORDER — FUROSEMIDE 10 MG/ML IJ SOLN
10 MG/ML | Freq: Once | INTRAMUSCULAR | Status: AC
Start: 2021-10-08 — End: 2021-10-07
  Administered 2021-10-08: 01:00:00 20 mg via INTRAVENOUS

## 2021-10-08 MED ORDER — ACETAMINOPHEN 325 MG PO TABS
325 | Freq: Four times a day (QID) | ORAL | Status: DC | PRN
Start: 2021-10-08 — End: 2021-10-12
  Administered 2021-10-08: 20:00:00 650 mg via ORAL

## 2021-10-08 MED ORDER — BALSAM PERU-CASTOR OIL TOPICAL OINTMENT
Freq: Two times a day (BID) | CUTANEOUS | Status: DC
Start: 2021-10-08 — End: 2021-10-12
  Administered 2021-10-08 – 2021-10-12 (×10): via TOPICAL

## 2021-10-08 MED ORDER — SODIUM CHLORIDE 0.9 % IV SOLN
0.9 % | INTRAVENOUS | Status: AC | PRN
Start: 2021-10-08 — End: 2021-10-12

## 2021-10-08 MED FILL — GENTEAL TEARS 0.1-0.2-0.3 % OP SOLN: OPHTHALMIC | Qty: 15

## 2021-10-08 MED FILL — CARVEDILOL 3.125 MG PO TABS: 3.125 MG | ORAL | Qty: 2

## 2021-10-08 MED FILL — MORPHINE SULFATE 2 MG/ML IJ SOLN: 2 mg/mL | INTRAMUSCULAR | Qty: 1

## 2021-10-08 MED FILL — PREDNISONE 5 MG PO TABS: 5 MG | ORAL | Qty: 2

## 2021-10-08 MED FILL — MIDODRINE HCL 5 MG PO TABS: 5 MG | ORAL | Qty: 1

## 2021-10-08 MED FILL — BPCO EX OINT: CUTANEOUS | Qty: 60

## 2021-10-08 MED FILL — CAPECITABINE 500 MG PO TABS: 500 MG | ORAL | Qty: 2

## 2021-10-08 MED FILL — BALSAM PERU-CASTOR OIL TOPICAL OINTMENT: CUTANEOUS | Qty: 56.7

## 2021-10-08 MED FILL — POTASSIUM CHLORIDE ER 10 MEQ PO TBCR: 10 MEQ | ORAL | Qty: 4

## 2021-10-08 MED FILL — OXAYDO 5 MG PO TABS: 5 MG | ORAL | Qty: 1

## 2021-10-08 MED FILL — FUROSEMIDE 10 MG/ML IJ SOLN: 10 MG/ML | INTRAMUSCULAR | Qty: 4

## 2021-10-08 MED FILL — ACETAMINOPHEN 325 MG PO TABS: 325 MG | ORAL | Qty: 2

## 2021-10-08 MED FILL — SPIRONOLACTONE 25 MG PO TABS: 25 MG | ORAL | Qty: 1

## 2021-10-08 NOTE — Progress Notes (Signed)
Comprehensive Nutrition Assessment    Type and Reason for Visit:  Initial, Positive Nutrition Screen    Nutrition Recommendations/Plan:   Resume diet as medically able after procedure  Family to bring Boost from home. Also trying Magic cups  Please document % meals and supplements consumed in flowsheet I/O's under intake      Malnutrition Assessment:  Malnutrition Status:  At risk for malnutrition (Comment) (10/08/21 1155)    Context:  Acute Illness     Findings of the 6 clinical characteristics of malnutrition:  Energy Intake:  Mild decrease in energy intake (Comment)  Weight Loss:  No significant weight loss     Body Fat Loss:  No significant body fat loss     Muscle Mass Loss:  No significant muscle mass loss    Fluid Accumulation:  Mild    Grip Strength:  Not Performed    Nutrition Assessment:     Chart reviewed for MST. 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 Pt reports a decreased appetite for the last 3-4 days PTA r/t difficulty breathing. Prior to this she was eating normally. She reports her usual weight is around 140lb, currently 143lb per the bed scale. Pt repeated expressed feeling hungry during my visit, but she is NPO pending thoracentesis. She drinks one Boost daily at home and refused Ensure, but husband may bring in her Boost. Pt also agreeable to trying Magic cups.     No data found.  Wt Readings from Last 5 Encounters:   10/08/21 64.9 kg (143 lb)   ]    Nutrition Related Findings:    Labs: K 3.2.   Meds: prednisone, aldactone.   Edema 2+ BLE.   BM 7/14.   Wound Type: None       Current Nutrition Intake & Therapies:    Average Meal Intake: NPO  Average Supplements Intake: NPO  Diet NPO Exceptions are: Sips of Water with Meds    Anthropometric Measures:  Height: 167.6 cm (5' 5.98")  Ideal Body Weight (IBW): 130 lbs (59 kg)       Current Body Weight: 143 lb (64.9 kg), 110 % IBW. Weight Source: Bed  Scale  Current BMI (kg/m2): 23.1        Weight Adjustment For: No Adjustment                 BMI Categories: Normal Weight (BMI 22.0 to 24.9) age over 51    Estimated Daily Nutrient Needs:  Energy Requirements Based On: Formula  Weight Used for Energy Requirements: Current  Energy (kcal/day): 1522 kcals (BMR x 1.3AF)  Weight Used for Protein Requirements: Current  Protein (g/day): 65-78g (1.0-1.2g/kg)  Method Used for Fluid Requirements: 1 ml/kcal  Fluid (ml/day): 1554m or per MD    Nutrition Diagnosis:   Inadequate protein-energy intake related to impaired respiratory function (decreased appetite PTA) as evidenced by NPO or clear liquid status due to medical condition, poor intake prior to admission    Nutrition Interventions:   Food and/or Nutrient Delivery: Start Oral Diet, Start Oral Nutrition Supplement  Nutrition Education/Counseling: No recommendation at this time  Coordination of Nutrition Care: Continue to monitor while inpatient       Goals:     Goals: Initiate PO diet, PO intake 50% or greater, by next RD assessment       Nutrition Monitoring and Evaluation:   Behavioral-Environmental Outcomes: None Identified  Food/Nutrient Intake  Outcomes: Diet Advancement/Tolerance, Food and Nutrient Intake, Supplement Intake  Physical Signs/Symptoms Outcomes: Biochemical Data, GI Status, Nutrition Focused Physical Findings, Weight    Discharge Planning:    Too soon to determine     Kristin Bruins, RD  Contact: [phone]

## 2021-10-08 NOTE — Progress Notes (Addendum)
1008 - Consent for right thoracentesis signed and placed on chart. Informed physician that thoracentesis cannot be done at bedside on surg tele. Contacted Therapist, art. NS states that bedside thoracentesis was approved by Manon Hilding.     1409 - Thoracentesis completed and chest tube placed. Messaged dr. Karma Lew for diet order.     1602 - Patient has put out 1300 in chest tube since it was placed 2 hours ago. Fluid originally yellow, now bloody. HR 110, Dr. Karma Lew at bedside. MD to place order for stepdown level of care.

## 2021-10-08 NOTE — Progress Notes (Signed)
Pt arrived from surg tele with R pleural chest tube to water seal, no crepitus appreciated. Tachypneic, pt states no dyspnea. Pulm consult ordered. Plan for CXR in am.

## 2021-10-08 NOTE — Plan of Care (Signed)
Problem: Discharge Planning  Goal: Discharge to home or other facility with appropriate resources  Outcome: Progressing     Problem: Skin/Tissue Integrity  Goal: Absence of new skin breakdown  Description: 1.  Monitor for areas of redness and/or skin breakdown  2.  Assess vascular access sites hourly  3.  Every 4-6 hours minimum:  Change oxygen saturation probe site  4.  Every 4-6 hours:  If on nasal continuous positive airway pressure, respiratory therapy assess nares and determine need for appliance change or resting period.  Outcome: Progressing     Problem: Safety - Adult  Goal: Free from fall injury  Outcome: Progressing     Problem: ABCDS Injury Assessment  Goal: Absence of physical injury  Outcome: Progressing

## 2021-10-08 NOTE — Procedures (Signed)
Interventional Radiology  Procedure Note        10/08/2021 2:17 PM    Patient: Ariel Day     Informed consent obtained    Diagnosis: pleural effusion    Procedure(s): US guided right chest tube placement, 82f one-step; ongoing drainage into water seal cannister    Specimens removed:  50cc clear yellow fluid    Complications: None    Primary Physician: NOmer Jack MD    Recommendations: Keep to water seal    Discharge Disposition: remain in room    Full dictated report to follow    NOmer Jack MD  Interventional Radiology  [facility]  2:17 PM, 10/08/2021

## 2021-10-08 NOTE — Progress Notes (Signed)
Bedside report given to Judson Roch

## 2021-10-08 NOTE — Progress Notes (Signed)
End of Shift Note    Bedside shift change report given to Dragoon (oncoming nurse) by Lanney Gins, RN (offgoing nurse).  Report included the following information SBAR, Kardex, Intake/Output, MAR, Recent Results, and Cardiac Rhythm Pace rhythm per the monitor .    Shift worked: Night     Shift summary and any significant changes:    An admission of 2200. Arrived on a stretcher & was too weak to transfer to the bed. VS were within normal limits and assessment done and recorded. Her due meds were started. Went to sleep calmly as she had no c/o pain. She is NPO this morning. Labs were drawn.   Concerns for physician to address:       Zone phone for oncoming shift:          Demyan Fugate Owens Shark, Therapist, sports

## 2021-10-08 NOTE — Progress Notes (Addendum)
Hospitalist Progress Note    NAME:   Ariel Day   DOB: Mar 10, 1946   MRN: [id number]     Date/Time: 10/08/2021    Patient PCP: No primary care provider on file.    Estimated discharge date:2 days  Barriers: CT removal       Assessment / Plan:      Recurrent large right malignant pleural effusion and moderate left pleural effusion, POA  --1st drainage 7 years ago and 2nd time ~4 years ago.   --not hypoxic at rest but severe dyspnea RR 31  --should discuss with her oncologist about potential thoracic surgery referral for pleurodesis vs. Pleurx catheter as this would be the third time she requires thoracentesis, last done 07/2020 at [facility] with removal of 744m  --s/p CT insertion by IR on 7/15, completed fluid analysis pending  --Pain control  --Pulm consult in am  --more bloody drainage from CT, if stable then start lovenox for dvt ppx in am     Metastatic breast cancer with mets to liver and bone, POA dx 5 years ago  --cont xeloda (1 dose tonight; pt to provide) on regimen every other week  --[place].  Refer to oncology group at discharge     Orthostatic hypotension  --cont midodrine bid  --cont prednisone '10mg'$  daily     Chronic diastolic chf, hx of CHFrEF s/p ICD  --cont aldactone '25mg'$  daily  --give lasix '20mg'$  IV x 1     Medical Decision Making:   I personally reviewed labs: Yes, as listed below  I personally reviewed imaging: CXR, large right pleural effusion occupying at least 1/2 lower lung  Toxic drug monitoring: n/a  Discussed case with: Pt, RN  Code Status: dw pt, wants to be full code  DVT Prophylaxis: SCD; put on lovenox after thoracentesis  GI Prophylaxis: none  Baseline: use walker when outside, cane inside home       Subjective:  Assessment / Plan:      S/p CT  C/o pain around CT site  Mild pain of RUQ  Afebrile   Discussed with RN events overnight.       Objective:      VITALS:   Last 24hrs VS reviewed since  prior progress note. Most recent are:  Patient Vitals for the past 24 hrs:   BP Temp Temp src Pulse Resp SpO2 Height Weight   10/08/21 1500 125/66 97.9 F (36.6 C) Oral (!) 103 18 100 % -- --   10/08/21 1359 118/74 -- -- (!) 115 16 96 % -- --   10/08/21 1345 136/78 -- -- (!) 111 18 96 % -- --   10/08/21 1150 -- -- -- -- -- -- 1.676 m (5' 5.98") 64.9 kg (143 lb)   10/08/21 1130 130/77 98.2 F (36.8 C) Oral 94 20 96 % -- --   10/08/21 0745 -- -- -- 100 -- -- -- --   10/08/21 0744 (!) 141/91 97.3 F (36.3 C) Oral (!) 103 18 95 % -- --   10/08/21 0401 -- -- -- -- -- -- -- 68.8 kg (151 lb 10.8 oz)   10/08/21 0338 133/87 97.5 F (36.4 C) Oral 92 18 99 % -- --   10/08/21 0000 -- 98 F (36.7 C) Axillary 89 17 98 % -- --   10/07/21 2308 132/89 97.5 F (36.4 C) Oral 98 18 97 % 1.676 m (5' 5.98") 64.8 kg (  142 lb 13.7 oz)   10/07/21 2201 125/72 -- Oral (!) 106 18 99 % -- --   10/07/21 2100 127/73 -- -- (!) 112 -- -- -- --   10/07/21 2030 125/70 -- -- (!) 112 25 -- -- --   10/07/21 2000 130/81 -- -- (!) 112 (!) 37 98 % -- --   10/07/21 1930 131/73 -- -- (!) 112 (!) 31 96 % -- --   10/07/21 1915 132/71 -- -- (!) 105 (!) 35 97 % -- --   10/07/21 1900 (!) 141/79 -- -- (!) 106 (!) 34 -- -- --   10/07/21 1845 135/75 -- -- (!) 104 27 97 % -- --   10/07/21 1829 127/78 -- -- -- -- -- -- --   10/07/21 1817 127/78 98.1 F (36.7 C) Oral (!) 102 18 98 % -- --         Intake/Output Summary (Last 24 hours) at 10/08/2021 1531  Last data filed at 10/08/2021 0500  Gross per 24 hour   Intake 50 ml   Output 500 ml   Net -450 ml        I had a face to face encounter and independently examined this patient on 10/08/2021, as outlined below:  PHYSICAL EXAM:  General: WD, WN. No acute distress    EENT:  EOMI. Anicteric sclerae. MMM  Resp:  CTA bilaterally, no wheezing or rales.  No accessory muscle use. R CT   CV:  Regular  rhythm,  No edema  GI/GU:  Soft, Non distended, mild RUQ tender.  +Bowel sounds  Neurologic:  Alert and oriented X 3, normal  speech,   Psych:   Not anxious nor agitated  Skin/MSK: No rashes.  No jaundice      Current Inpatient Medications:      Current Facility-Administered Medications:     sodium chloride flush 0.9 % injection 5-40 mL, 5-40 mL, IntraVENous, 2 times per day, Nolon Nations, MD, 10 mL at 10/08/21 0808    sodium chloride flush 0.9 % injection 5-40 mL, 5-40 mL, IntraVENous, PRN, Nolon Nations, MD    0.9 % sodium chloride infusion, , IntraVENous, PRN, Nolon Nations, MD    ondansetron (ZOFRAN-ODT) disintegrating tablet 4 mg, 4 mg, Oral, Q8H PRN **OR** ondansetron (ZOFRAN) injection 4 mg, 4 mg, IntraVENous, Q6H PRN, Nolon Nations, MD    polyethylene glycol (GLYCOLAX) packet 17 g, 17 g, Oral, Daily PRN, Nolon Nations, MD    acetaminophen (TYLENOL) tablet 650 mg, 650 mg, Oral, Q6H PRN **OR** acetaminophen (TYLENOL) suppository 650 mg, 650 mg, Rectal, Q6H PRN, Nolon Nations, MD    carvedilol (COREG) tablet 6.25 mg, 6.25 mg, Oral, BID WC, Nolon Nations, MD, 6.25 mg at 10/08/21 0807    midodrine (PROAMATINE) tablet 2.5 mg, 2.5 mg, Oral, BID WC, Nolon Nations, MD, 2.5 mg at 10/07/21 2100    predniSONE (DELTASONE) tablet 10 mg, 10 mg, Oral, Daily, Nolon Nations, MD, 10 mg at 10/08/21 1017    spironolactone (ALDACTONE) tablet 25 mg, 25 mg, Oral, Daily, Nolon Nations, MD, 25 mg at 10/08/21 5102    balsum peru-castor oil (VENELEX) ointment, , Topical, BID, Dara Hoyer., MD, Given at 10/08/21 0813    artificial tears (dextran-hypromellose-glycerin) (GENTEAL) ophthalmic solution 1 drop, 1 drop, Both Eyes, Q4H PRN, Rolla Flatten, PA-C       LABS:    I reviewed today's most current labs and imaging studies.    Pertinent  labs include:  Recent Labs     10/07/21  1539 10/08/21  0337   WBC 11.7* 10.2   HGB 13.5 13.0   HCT 41.6 39.3   PLT 295 245     Recent Labs     10/07/21  0337 10/07/21  1539 10/08/21  0337   NA  --  138 138   K  --  4.2 3.2*   CL  --  103 102   CO2  --  28 28   GLUCOSE  --  127* 102*   BUN  --  20 22*   CREATININE  --   1.09* 0.92   CALCIUM  --  10.1 9.7   MG  --   --  2.2   LABALBU  --  3.3*  --    BILITOT  --  0.9  --    AST  --  41*  --    ALT  --  50  --    INR 1.1  --   --      Xray Result (most recent):  XR CHEST PORTABLE  Narrative: INDICATION:  SOB     COMPARISON: December 2022    FINDINGS: Single AP portable view of the chest obtained at 1748 demonstrates a  large right and moderate left pleural effusion, with bibasilar atelectasis,  right worse than left. There is cardiomegaly and a left-sided pacer device.  Impression: Bilateral pleural effusions, right greater than left.  CTA CHEST W WO CONTRAST  Narrative: EXAM:  CTA CHEST W WO CONTRAST    INDICATION: Shortness of breath, metastatic breast cancer    COMPARISON: CT December 2022    TECHNIQUE: Helical thin section chest CT following uneventful intravenous  administration of nonionic contrast 100 mL of isovue 370 according to  departmental PE protocol. Coronal and sagittal reformats were performed. 3D post  processing was performed.  CT dose reduction was achieved through the use of a  standardized protocol tailored for this examination and automatic exposure  control for dose modulation.    FINDINGS: This is a good quality study for the evaluation of pulmonary embolism  to the first subsegmental arterial level. There is no pulmonary embolism to this  level.    Pacer device is present in the left chest.  THYROID: No nodule.  MEDIASTINUM: No mass or lymphadenopathy.  HILA: No mass or lymphadenopathy.  THORACIC AORTA: No aneurysm.  HEART: Normal in size.  ESOPHAGUS: No wall thickening or dilatation.  TRACHEA/BRONCHI: Patent.  PLEURA: Large right pleural effusion, increased in volume. Moderate left pleural  effusion, similar to prior study.  LUNGS: Complete collapse of the right lower lobe. Mild left basilar atelectasis.  UPPER ABDOMEN: Multiple ill-defined liver lesions, consistent with diffuse  hepatic metastatic disease. The liver is also surrounding it.  BONES: Diffuse  osseous metastatic disease. This is similar to prior study.  Impression: 1. No evidence of pulmonary embolism.  2. Large right pleural effusion, increased in size.  3. Moderate left pleural effusion, not significantly changed.  4. Bilateral lower lobe atelectasis, right worse than left.  5. Diffuse osseous and hepatic metastatic disease.     Microbiology:  Results       ** No results found for the last 336 hours. **          ECHO:   No results found for this or any previous visit.    Procedures: see electronic medical records for all procedures/Xrays and details which were not copied into this  note but were reviewed prior to creation of Plan.    Reviewed most current lab test results and cultures  YES  Reviewed most current radiology test results   YES  Review and summation of old records today    NO  Reviewed patient's current orders and MAR    YES  PMH/SH reviewed - no change compared to H&P  ________________________________________________________________________    ________________________________________________________________________  Total NON critical care TIME:  45  Minutes      Total CRITICAL CARE TIME Spent:   Minutes non procedure based        Comments   >50% of visit spent in counseling and coordination of care x    ________________________________________________________________________      Signed: Joanna Hews, MD

## 2021-10-08 NOTE — Progress Notes (Signed)
1900: Bedside shift change report given to [facility] RN (oncoming nurse) by Glenetta Hew (offgoing nurse). Report included the following information Nurse Handoff Report, Intake/Output, MAR, Recent Results, and Cardiac Rhythm NSR .

## 2021-10-08 NOTE — Progress Notes (Signed)
Name of procedure: Right Thoracentesis    Sedation medications given: NO    Vital Signs: Stable    Fluids removed: Continuous  drainage to pleuravac    Samples sent to lab: YES    Any complications related to procedure: NO    Post Procedure Care Needed/order sets placed in connect care.     Patient is at increased fall risk due to medication given.

## 2021-10-08 NOTE — Progress Notes (Signed)
Patient transferring to PCU. Report given to Arrie Eastern, Therapist, sports.

## 2021-10-09 ENCOUNTER — Inpatient Hospital Stay: Admit: 2021-10-09 | Payer: MEDICARE | Primary: Family Medicine

## 2021-10-09 LAB — CBC
Hematocrit: 40.1 % (ref 35.0–47.0)
Hemoglobin: 13.1 g/dL (ref 11.5–16.0)
MCH: 34.1 PG — ABNORMAL HIGH (ref 26.0–34.0)
MCHC: 32.7 g/dL (ref 30.0–36.5)
MCV: 104.4 FL — ABNORMAL HIGH (ref 80.0–99.0)
MPV: 10.2 FL (ref 8.9–12.9)
Nucleated RBCs: 0 PER 100 WBC
Platelets: 231 10*3/uL (ref 150–400)
RBC: 3.84 M/uL (ref 3.80–5.20)
RDW: 17.6 % — ABNORMAL HIGH (ref 11.5–14.5)
WBC: 13.2 10*3/uL — ABNORMAL HIGH (ref 3.6–11.0)
nRBC: 0 10*3/uL (ref 0.00–0.01)

## 2021-10-09 LAB — BASIC METABOLIC PANEL
Anion Gap: 5 mmol/L (ref 5–15)
BUN: 30 MG/DL — ABNORMAL HIGH (ref 6–20)
Bun/Cre Ratio: 33 — ABNORMAL HIGH (ref 12–20)
CO2: 28 mmol/L (ref 21–32)
Calcium: 9.9 MG/DL (ref 8.5–10.1)
Chloride: 104 mmol/L (ref 97–108)
Creatinine: 0.92 MG/DL (ref 0.55–1.02)
Est, Glom Filt Rate: >60 mL/min/{1.73_m2} (ref >60)
Glucose: 123 mg/dL — ABNORMAL HIGH (ref 65–100)
Potassium: 4.2 mmol/L (ref 3.5–5.1)
Sodium: 137 mmol/L (ref 136–145)

## 2021-10-09 LAB — LACTATE DEHYDROGENASE: LD: 440 U/L — ABNORMAL HIGH (ref 81–246)

## 2021-10-09 LAB — PROTEIN, BODY FLUID: Total Protein, Body Fluid: 4.1 g/dL

## 2021-10-09 LAB — LACTATE DEHYDROGENASE, BODY FLUID: LD, Fluid: 264 U/L

## 2021-10-09 LAB — VITAMIN B12 & FOLATE
Folate: 20.4 ng/mL (ref 5.0–21.0)
Vitamin B-12: 614 pg/mL (ref 193–986)

## 2021-10-09 LAB — PROTEIN, TOTAL: Total Protein: 6.2 g/dL — ABNORMAL LOW (ref 6.4–8.2)

## 2021-10-09 LAB — TSH: TSH, 3RD GENERATION: 2.16 u[IU]/mL (ref 0.36–3.74)

## 2021-10-09 MED ORDER — ENOXAPARIN SODIUM 40 MG/0.4ML IJ SOSY
40 | Freq: Every day | INTRAMUSCULAR | Status: DC
Start: 2021-10-09 — End: 2021-10-12
  Administered 2021-10-09 – 2021-10-12 (×4): 40 mg via SUBCUTANEOUS

## 2021-10-09 MED FILL — PREDNISONE 5 MG PO TABS: 5 MG | ORAL | Qty: 2

## 2021-10-09 MED FILL — OXYCODONE HCL 5 MG PO TABS: 5 MG | ORAL | Qty: 1

## 2021-10-09 MED FILL — MIDODRINE HCL 5 MG PO TABS: 5 MG | ORAL | Qty: 1

## 2021-10-09 MED FILL — CARVEDILOL 6.25 MG PO TABS: 6.25 MG | ORAL | Qty: 1

## 2021-10-09 MED FILL — SPIRONOLACTONE 25 MG PO TABS: 25 MG | ORAL | Qty: 1

## 2021-10-09 MED FILL — ENOXAPARIN SODIUM 40 MG/0.4ML IJ SOSY: 40 MG/0.4ML | INTRAMUSCULAR | Qty: 0.4

## 2021-10-09 NOTE — Care Coordination-Inpatient (Addendum)
Care Management Initial Assessment       RUR: 13%  Readmission? No  1st IM letter given? Yes -10/08/21  1st [employer] letter given: No     10/09/21 1134   Service Assessment   Patient Orientation Alert and Oriented   Cognition Alert   History Provided By Patient;Spouse   Primary Caregiver Spouse   Support Systems Spouse/Significant Other;Children   Patient's Healthcare Decision Maker is: Scientist, research (physical sciences) Next of Kin  Blair Heys)   PCP Verified by CM Yes   Last Visit to PCP   (Pt just moved to the area from [facility] and has a new PCP follow up but pt and pt's spouse was unable to provide the PCP name and appointment date)   Prior Functional Level Assistance with the following:;Bathing;Dressing;Toileting;Cooking;Housework;Shopping;Mobility   Current Functional Level Assistance with the following:;Bathing;Dressing;Toileting;Cooking;Housework;Shopping;Mobility   Can patient return to prior living arrangement Yes   Family able to assist with home care needs: Yes   Would you like for me to discuss the discharge plan with any other family members/significant others, and if so, who? Yes  (Pt's spouse-Billy Costilow)   Financial Resources [employer] Resources None   Social/Functional History   Lives With Spouse   Type of Home Mobile home   Home Equipment Walker, rolling;Wheelchair-manual  Media planner)   Active Driver No   Patient's Magazine features editor Pt's spouse or dtr transport pt to her medical appointments   Discharge Planning   Type of Residence Trailer/Mobile Home   Current Services Prior To Admission None   Patient expects to be discharged to: Trailer/mobile home     CM introduce self, explain role and confirm demographics with pt and pt's spouse.    Pt lives with her spouse in a mobile home in her dtr's driving with four steps to enter into the mobile home.     Pt stated that she and her spouse just moved to the area to be closer to their dtr due to pt's health. They moved here from [facility].    Pt stated they are building a in  law suite onto their dtr's home which should be completed at the end of this month.     Pt has a rolling walker, w/c and shower chair.     Pt has a hx of home health and inpatient rehab. No hx of SNF.    Pt's spouse assist pt with all of her ADLs.    Pt uses [facility] on [address] (mail order).    At the time of d/c pt's spouse will transport.    CM will follow and assist with d/c planning.    [place] worker/Case manger  [phone]

## 2021-10-09 NOTE — Progress Notes (Signed)
Pt c/o about being woken up for care at 2200- apologized to pt and provided education on need for repositioning to prevent skin breakdown and venelex administration; prepared pt for need for Q4 vitals and lab work in AM. Pt states "No one else has done this- they let me sleep all night and I am not going to be awake early" After explaining importance of these orders and pointing out that labs were done this morning at 0445, pt states "I must have forgotten" Checked 2300 vitals early and reassured pt rest and sleep would be promoted and supported as much as possible.

## 2021-10-09 NOTE — Progress Notes (Signed)
Hospitalist Progress Note    NAME:   Ariel Day   DOB: 12-Jul-1945   MRN: [id number]     Date/Time: 10/09/2021    Patient PCP: No primary care provider on file.    Estimated discharge date: > 48 hrs  Barriers: Chest tube removal       Assessment / Plan:      Recurrent large right malignant pleural effusion and moderate left pleural effusion, POA  --1st drainage 7 years ago and 2nd time ~4 years ago.   --s/p CT insertion by IR on 7/15, completed fluid analysis pending  Plan  --should discuss with her oncologist, Dr. Elinor Parkinson, about potential thoracic surgery referral for pleurodesis vs. Pleurx catheter as this would be the third time she requires thoracentesis, last done 07/2020 at [facility] with removal of 781m  --Pain control  --Pulm consult in am  --more bloody drainage from CT, trial lovenox instead of SCDs     Metastatic breast cancer with mets to liver and bone, POA dx 5 years ago  --cont xeloda (1 dose tonight; pt to provide) on regimen every other week  [place].  Refer to oncology group at discharge     Orthostatic hypotension  --cont midodrine bid  --cont prednisone '10mg'$  daily     Chronic diastolic chf, hx of CHFrEF s/p ICD  --cont aldactone '25mg'$  daily  --give lasix '20mg'$  IV x 1     Medical Decision Making:   I personally reviewed labs: CBC, BMP  I personally reviewed imaging: CXR  Toxic drug monitoring:   Discussed case with:       Code Status: FULL  DVT Prophylaxis: Lovenox  GI Prophylaxis: none  Baseline: use walker when outside, cane inside home       Subjective:  Assessment / Plan:    Afebrile.  No new complaints today.  Does NOT want pleurex, but is okay with talking to her oncologist tomorrow.  Discussed with RN events overnight.       Objective:      VITALS:   Last 24hrs VS reviewed since prior progress note. Most recent are:  Patient Vitals for the past 24 hrs:   BP Temp Temp src Pulse Resp SpO2   10/09/21 1200  110/66 98 F (36.7 C) Axillary 99 23 94 %   10/09/21 0852 -- -- -- -- 23 --   10/09/21 0837 119/81 -- -- 100 -- --   10/09/21 0800 131/77 98.2 F (36.8 C) Axillary (!) 102 21 94 %   10/09/21 0400 109/75 98.5 F (36.9 C) Oral 99 20 93 %   10/09/21 0005 -- -- -- -- 26 --   10/09/21 0000 118/73 98.1 F (36.7 C) Oral 100 (!) 32 94 %   10/08/21 1915 119/76 97.5 F (36.4 C) Oral (!) 103 23 95 %   10/08/21 1714 110/73 -- -- (!) 104 30 96 %   10/08/21 1648 -- -- -- -- 20 --   10/08/21 1615 120/78 -- -- (!) 111 -- 99 %   10/08/21 1500 125/66 97.9 F (36.6 C) Oral (!) 103 18 100 %         Intake/Output Summary (Last 24 hours) at 10/09/2021 1422  Last data filed at 10/09/2021 0400  Gross per 24 hour   Intake --   Output 1520 ml   Net -1520 ml        I had a face to  face encounter and independently examined this patient on 10/09/2021, as outlined below:  PHYSICAL EXAM:  General: WD, WN. No acute distress    EENT:  EOMI. Anicteric sclerae. MMM  Resp:  CTA bilaterally, no wheezing or rales.  No accessory muscle use. R CT   CV:  Regular  rhythm,  No edema  GI/GU:  Soft, Non distended.  +Bowel sounds  Neurologic:  Alert and oriented X 3, normal speech,   Psych:   Not anxious nor agitated  Skin/MSK: No rashes.  No jaundice      Current Inpatient Medications:      Current Facility-Administered Medications:     enoxaparin (LOVENOX) injection 40 mg, 40 mg, SubCUTAneous, Daily, Araceli Bouche, MD    oxyCODONE (ROXICODONE) immediate release tablet 5 mg, 5 mg, Oral, Q4H PRN, Joanna Hews, MD, 5 mg at 10/09/21 0852    morphine (PF) injection 1 mg, 1 mg, IntraVENous, Q4H PRN, Joanna Hews, MD, 1 mg at 10/08/21 1751    sodium chloride flush 0.9 % injection 5-40 mL, 5-40 mL, IntraVENous, 2 times per day, Nolon Nations, MD, 10 mL at 10/09/21 0839    sodium chloride flush 0.9 % injection 5-40 mL, 5-40 mL, IntraVENous, PRN, Nolon Nations, MD    0.9 % sodium chloride infusion, , IntraVENous, PRN, Nolon Nations, MD     ondansetron (ZOFRAN-ODT) disintegrating tablet 4 mg, 4 mg, Oral, Q8H PRN **OR** ondansetron (ZOFRAN) injection 4 mg, 4 mg, IntraVENous, Q6H PRN, Nolon Nations, MD    polyethylene glycol (GLYCOLAX) packet 17 g, 17 g, Oral, Daily PRN, Nolon Nations, MD    acetaminophen (TYLENOL) tablet 650 mg, 650 mg, Oral, Q6H PRN, 650 mg at 10/08/21 1625 **OR** acetaminophen (TYLENOL) suppository 650 mg, 650 mg, Rectal, Q6H PRN, Nolon Nations, MD    carvedilol (COREG) tablet 6.25 mg, 6.25 mg, Oral, BID WC, Nolon Nations, MD, 6.25 mg at 10/09/21 0837    midodrine (PROAMATINE) tablet 2.5 mg, 2.5 mg, Oral, BID WC, Nolon Nations, MD, 2.5 mg at 10/09/21 5462    predniSONE (DELTASONE) tablet 10 mg, 10 mg, Oral, Daily, Nolon Nations, MD, 10 mg at 10/09/21 7035    spironolactone (ALDACTONE) tablet 25 mg, 25 mg, Oral, Daily, Nolon Nations, MD, 25 mg at 10/09/21 0093    balsum peru-castor oil (VENELEX) ointment, , Topical, BID, Dara Hoyer., MD, Given at 10/09/21 [phone]    artificial tears (dextran-hypromellose-glycerin) (GENTEAL) ophthalmic solution 1 drop, 1 drop, Both Eyes, Q4H PRN, Rolla Flatten, PA-C       LABS:    I reviewed today's most current labs and imaging studies.    Pertinent labs include:  Recent Labs     10/07/21  1539 10/08/21  0337 10/09/21  0449   WBC 11.7* 10.2 13.2*   HGB 13.5 13.0 13.1   HCT 41.6 39.3 40.1   PLT 295 245 231     Recent Labs     10/07/21  0337 10/07/21  1539 10/08/21  0337 10/09/21  0449   NA  --  138 138 137   K  --  4.2 3.2* 4.2   CL  --  103 102 104   CO2  --  '28 28 28   '$ GLUCOSE  --  127* 102* 123*   BUN  --  20 22* 30*   CREATININE  --  1.09* 0.92 0.92   CALCIUM  --  10.1 9.7 9.9   MG  --   --  2.2  --    LABALBU  --  3.3*  --   --    BILITOT  --  0.9  --   --    AST  --  41*  --   --    ALT  --  50  --   --    INR 1.1  --   --   --      Xray Result (most recent):  XR CHEST PORTABLE  Narrative: EXAM:  XR CHEST PORTABLE    INDICATION: Right thoracentesis    COMPARISON: 10/07/2021 at 1752  hours    TECHNIQUE: Portable AP upright chest view at 1626 hours    FINDINGS: A probable right chest tube is visualized in the right costophrenic  angle. The right pleural effusion is resolved. There is a tiny right  pneumothorax versus trapped lung. There is a stable small left pleural effusion.    The left chest ICD and wires are stable. The cardiomediastinal contours are  stable. The bones and upper abdomen are stable.  Impression: Resolved right pleural effusion with a probable right chest tube in place. Tiny  right pneumothorax versus trapped lung. Stable small left pleural effusion.     Microbiology:  Results       ** No results found for the last 336 hours. **          ECHO:   No results found for this or any previous visit.    Procedures: see electronic medical records for all procedures/Xrays and details which were not copied into this note but were reviewed prior to creation of Plan.    Reviewed most current lab test results and cultures  YES  Reviewed most current radiology test results   YES  Review and summation of old records today    NO  Reviewed patient's current orders and MAR    YES  PMH/SH reviewed - no change compared to H&P  ________________________________________________________________________    ________________________________________________________________________  Total NON critical care TIME:    Minutes      Total CRITICAL CARE TIME Spent:   Minutes non procedure based        Comments   >50% of visit spent in counseling and coordination of care x    ________________________________________________________________________      Signed: Araceli Bouche, MD

## 2021-10-09 NOTE — Consults (Signed)
Name: Ariel Day   DOB: 03-14-1946 Admit Date: 10/07/2021   Phone: [phone]  Room: 2326/01   PCP: No primary care provider on file.  MRN: [id number]   Date: 10/09/2021  Code: Full Code          Chart and notes reviewed. Data reviewed. I review the patient's current medications in the medical record at each encounter.  I have evaluated and examined the patient.     HPI:    10:13 AM       History was obtained from patient and spouse/SO.      I was asked by Dara Hoyer., MD to see Ariel Day in consultation for a chief complaint of pleural effusion and chest tube management.      History of Present Illness:    Presented to ED on 10/07/21 with complaints of significant SOB. She lives with her daughter who is a PhD in the field of Neuroscience. Her daughter advised her to come to ED for further evaluation. Large pleural effusion note upon arrival to ED. Chest tube placed. No history of lung disease.     She has had thoracentesis performed 2 x in the past. First time was about 7 years ago and last time was about 2-3 years ago.     She is feeling much better following chest tube placement. She denies SOB, cough, wheezing. No pain.     Afebrile  Slightly tachycardic  BP stable  Oxygen saturations stable on RA (has not needed supplemental O2 during this admission)  Na 137  K 4.2  Pro-BNP 489  WBC 13.2  1520 mL total chest tube output since placement 10/08/21  Only 20 mL output from chest tube overnight  Pleural fluid appears exudative    Images reviewed:    CXR 10/09/21: report pending    CXR 10/07/21: resolved right pleural effusion with a probable right chest tube in place; tiny  right pneumothorax versus trapped lung; stable small left pleural effusion.    CTA Chest 10/07/21: no evidence of pulmonary embolism; large right pleural effusion, increased in size; moderate left pleural effusion, not significantly changed; bilateral lower lobe atelectasis, right worse than  left; diffuse osseous and hepatic metastatic disease.    Past Medical History:   Diagnosis Date    Breast cancer ([place])     Cardiomyopathy ([employer])     EF10%; 65% 2022    Malignant pleural effusion     Orthostatic hypotension        Past Surgical History:   Procedure Laterality Date    CARDIAC DEFIBRILLATOR PLACEMENT Left 2012    CHOLECYSTECTOMY      JOINT REPLACEMENT Bilateral     MASTECTOMY, BILATERAL         Family History   Problem Relation Age of Onset    Heart Disease Mother     Breast Cancer Mother     Heart Disease Father        Social History     Tobacco Use    Smoking status: Never    Smokeless tobacco: Not on file   Substance Use Topics    Alcohol use: Not Currently       No Known Allergies    Current Facility-Administered Medications   Medication Dose Route Frequency    oxyCODONE (ROXICODONE) immediate release tablet 5 mg  5 mg Oral Q4H PRN    morphine (PF) injection 1 mg  1 mg IntraVENous Q4H PRN  sodium chloride flush 0.9 % injection 5-40 mL  5-40 mL IntraVENous 2 times per day    sodium chloride flush 0.9 % injection 5-40 mL  5-40 mL IntraVENous PRN    0.9 % sodium chloride infusion   IntraVENous PRN    ondansetron (ZOFRAN-ODT) disintegrating tablet 4 mg  4 mg Oral Q8H PRN    Or    ondansetron (ZOFRAN) injection 4 mg  4 mg IntraVENous Q6H PRN    polyethylene glycol (GLYCOLAX) packet 17 g  17 g Oral Daily PRN    acetaminophen (TYLENOL) tablet 650 mg  650 mg Oral Q6H PRN    Or    acetaminophen (TYLENOL) suppository 650 mg  650 mg Rectal Q6H PRN    carvedilol (COREG) tablet 6.25 mg  6.25 mg Oral BID WC    midodrine (PROAMATINE) tablet 2.5 mg  2.5 mg Oral BID WC    predniSONE (DELTASONE) tablet 10 mg  10 mg Oral Daily    spironolactone (ALDACTONE) tablet 25 mg  25 mg Oral Daily    balsum peru-castor oil (VENELEX) ointment   Topical BID    artificial tears (dextran-hypromellose-glycerin) (GENTEAL) ophthalmic solution 1 drop  1 drop Both Eyes Q4H PRN         REVIEW OF SYSTEMS   12 point ROS negative except as  stated in the HPI.      Physical Exam:   Vitals:    10/09/21 0852   BP:    Pulse:    Resp: 23   Temp:    SpO2:        General:  Alert, cooperative, no distress, appears stated age.   Head:  Normocephalic, without obvious abnormality, atraumatic.   Eyes:  Conjunctivae/corneas clear.    Nose: Nares normal. Septum midline. Mucosa normal.    Throat: Lips, mucosa, and tongue normal.    Neck: Supple, symmetrical, trachea midline, no adenopathy.   Lungs:   Clear to auscultation bilaterally; very mildly diminished at bilateral bases   Chest wall:  No tenderness or deformity.   Heart:  Regular rate and rhythm, S1, S2 normal, no murmur, click, rub or gallop.   Abdomen:   Soft, non-tender. Bowel sounds normal. No masses,  No organomegaly.   Extremities: Extremities normal, atraumatic, no cyanosis or edema.   Pulses: 2+ and symmetric all extremities.   Skin: Skin color, texture, turgor normal. No rashes or lesions   Lymph nodes: Cervical, supraclavicular nodes normal.   Neurologic: Grossly nonfocal       Lab Results   Component Value Date/Time    NA 137 10/09/2021 04:49 AM    K 4.2 10/09/2021 04:49 AM    CL 104 10/09/2021 04:49 AM    CO2 28 10/09/2021 04:49 AM    BUN 30 10/09/2021 04:49 AM    MG 2.2 10/08/2021 03:37 AM       Lab Results   Component Value Date/Time    WBC 13.2 10/09/2021 04:49 AM    HGB 13.1 10/09/2021 04:49 AM    PLT 231 10/09/2021 04:49 AM    MCV 104.4 10/09/2021 04:49 AM       Lab Results   Component Value Date/Time    INR 1.1 10/07/2021 03:37 AM    APTT 23.1 10/07/2021 03:37 AM    GLOB 3.8 10/07/2021 03:39 PM       No results found for: IRON, TIBC, IBCT, FERR    No results found for: CRP, ANA, RA, RPR, RPRT, VDRLS, TSH     @  BRIEFLAB24(ph,phi,pco2,pco2i,po2,po2i,hco3,hco3i,fio2,fio2i)@    Lab Results   Component Value Date/Time    BNP 550 03/14/2021 07:23 PM        No components found for: CULT    No results found for: CMV, RPR, HBCM, HBSAG, HAAB, HCAB1, G6PD, HIVR, HIV1, HIV12, HIVPC, HIVRPI    No results  found for: CPK    No components found for: COLOR, APPRN, SPGRU, PHU, PROTU, GLUCU, KETU, BILU, BLDU, UROU, NITU, LEUKU, WBCU, RBCU, UEPI, BACTU, CASTS, UCRY    IMPRESSION  SOB  Pleural Effusion s/p chest tube placement 7/15  Stage IV Metastatic Breast Cancer  CHF/Cardiomyopathy s/p ICD    PLAN  Maintain oxygen saturations > 90%; currently on RA  Chest tube in place  CXR tomorrow AM  Currently on oral chemo from Dr. Stephens November in [place], [place]  Would benefit from speaking with Dr. Elinor Parkinson tomorrow to determine if PleurX catheter would be recommended vs. Monitoring as has been years since last thoracentesis    Thank you for allowing [place] to participate in the care of this patient.  We will be happy to follow along in her progress with you.    Virgia Land, APRN - NP

## 2021-10-10 ENCOUNTER — Inpatient Hospital Stay: Admit: 2021-10-10 | Payer: MEDICARE | Primary: Family Medicine

## 2021-10-10 LAB — CBC WITH AUTO DIFFERENTIAL
Absolute Immature Granulocyte: 0.1 10*3/uL — ABNORMAL HIGH (ref 0.00–0.04)
Basophils %: 0 % (ref 0–1)
Basophils Absolute: 0 10*3/uL (ref 0.0–0.1)
Eosinophils %: 1 % (ref 0–7)
Eosinophils Absolute: 0.1 10*3/uL (ref 0.0–0.4)
Hematocrit: 37.7 % (ref 35.0–47.0)
Hemoglobin: 12.3 g/dL (ref 11.5–16.0)
Immature Granulocytes: 1 % — ABNORMAL HIGH (ref 0.0–0.5)
Lymphocytes %: 10 % — ABNORMAL LOW (ref 12–49)
Lymphocytes Absolute: 1.3 10*3/uL (ref 0.8–3.5)
MCH: 34.3 PG — ABNORMAL HIGH (ref 26.0–34.0)
MCHC: 32.6 g/dL (ref 30.0–36.5)
MCV: 105 FL — ABNORMAL HIGH (ref 80.0–99.0)
MPV: 10.6 FL (ref 8.9–12.9)
Monocytes %: 9 % (ref 5–13)
Monocytes Absolute: 1.2 10*3/uL — ABNORMAL HIGH (ref 0.0–1.0)
Neutrophils %: 79 % — ABNORMAL HIGH (ref 32–75)
Neutrophils Absolute: 10 10*3/uL — ABNORMAL HIGH (ref 1.8–8.0)
Nucleated RBCs: 0 PER 100 WBC
Platelets: 227 10*3/uL (ref 150–400)
RBC: 3.59 M/uL — ABNORMAL LOW (ref 3.80–5.20)
RDW: 17.3 % — ABNORMAL HIGH (ref 11.5–14.5)
WBC: 12.7 10*3/uL — ABNORMAL HIGH (ref 3.6–11.0)
nRBC: 0 10*3/uL (ref 0.00–0.01)

## 2021-10-10 LAB — BASIC METABOLIC PANEL
Anion Gap: 4 mmol/L — ABNORMAL LOW (ref 5–15)
BUN: 28 MG/DL — ABNORMAL HIGH (ref 6–20)
Bun/Cre Ratio: 34 — ABNORMAL HIGH (ref 12–20)
CO2: 27 mmol/L (ref 21–32)
Calcium: 9.8 MG/DL (ref 8.5–10.1)
Chloride: 104 mmol/L (ref 97–108)
Creatinine: 0.82 MG/DL (ref 0.55–1.02)
Est, Glom Filt Rate: >60 mL/min/{1.73_m2} (ref >60)
Glucose: 114 mg/dL — ABNORMAL HIGH (ref 65–100)
Potassium: 4.1 mmol/L (ref 3.5–5.1)
Sodium: 135 mmol/L — ABNORMAL LOW (ref 136–145)

## 2021-10-10 MED ORDER — MIDODRINE HCL 5 MG PO TABS
5 MG | Freq: Three times a day (TID) | ORAL | Status: AC
Start: 2021-10-10 — End: 2021-10-12
  Administered 2021-10-10 – 2021-10-12 (×6): 5 mg via ORAL

## 2021-10-10 MED ORDER — SPIRONOLACTONE 25 MG PO TABS
25 MG | Freq: Every day | ORAL | Status: AC
Start: 2021-10-10 — End: 2021-10-12
  Administered 2021-10-11 – 2021-10-12 (×2): 12.5 mg via ORAL

## 2021-10-10 MED ORDER — CARVEDILOL 3.125 MG PO TABS
3.125 MG | Freq: Two times a day (BID) | ORAL | Status: AC
Start: 2021-10-10 — End: 2021-10-12
  Administered 2021-10-11 – 2021-10-12 (×3): 3.125 mg via ORAL

## 2021-10-10 MED FILL — SPIRONOLACTONE 25 MG PO TABS: 25 MG | ORAL | Qty: 1

## 2021-10-10 MED FILL — ENOXAPARIN SODIUM 40 MG/0.4ML IJ SOSY: 40 MG/0.4ML | INTRAMUSCULAR | Qty: 0.4

## 2021-10-10 MED FILL — MIDODRINE HCL 5 MG PO TABS: 5 MG | ORAL | Qty: 1

## 2021-10-10 MED FILL — CARVEDILOL 6.25 MG PO TABS: 6.25 MG | ORAL | Qty: 1

## 2021-10-10 MED FILL — PREDNISONE 5 MG PO TABS: 5 MG | ORAL | Qty: 2

## 2021-10-10 NOTE — Plan of Care (Signed)
Problem: Physical Therapy - Adult  Goal: By Discharge: Performs mobility at highest level of function for planned discharge setting.  See evaluation for individualized goals.  Description: FUNCTIONAL STATUS PRIOR TO ADMISSION: Patient was modified independent using a SPC inside and RW outside for functional mobility. She also has a transport chair for when she goes to MD appointments. Family assists with ADLs.     HOME SUPPORT PRIOR TO ADMISSION: The patient lives with her husband in a mobile home on her daughter's property while the "in-law" suite is finishing being built. There are 3 steps with B rails (too wide to reach both) to get in.     Physical Therapy Goals  Initiated 10/10/2021  1.  Patient will move from supine to sit and sit to supine in bed with supervision/set-up within 7 day(s).    2.  Patient will perform sit to stand with modified independence within 7 day(s).  3.  Patient will transfer from bed to chair and chair to bed with supervision/set-up using the least restrictive device within 7 day(s).  4.  Patient will ambulate with supervision/set-up for 150 feet with the least restrictive device within 7 day(s).   5.  Patient will ascend/descend 3 stairs with 1 handrail(s) with supervision/set-up within 7 day(s).   Outcome: Not Progressing    PHYSICAL THERAPY EVALUATION    Patient: Ariel Day (76 y.o. female)  Date: 10/10/2021  Primary Diagnosis: Pleural effusion [J90]       Precautions: Fall Risk                    ASSESSMENT :   DEFICITS/IMPAIRMENTS:   The patient is limited by decreased strength, activity tolerance, endurance, safety awareness, balance. Pt cleared by nursing to mobilize. Upon entering the room she was sitting up in bed with noted left lateral trunk lean. She required minimum assistance, mainly for LE management to come sit on the edge of the bed, HOB slightly elevated, verbal/tactile cues provided to use bed rail. She reports she is getting a bed that the head raises/lowers at home  soon. BP stable from supine to sit, no dizziness reported. She performed sit<>stand transfers with standby assist and no BUE supported needed. Performed stand<>pivot transfer to get from bed to chair using RW with contact guard assist. Mobility limited today by cardiac monitoring- HR increased to 122 bpm with stand<>pivot transfer. HR remained above 100 at rest. Patient presents with impulsivity, decreased safety awareness, and decreased insight to her deficits. At end of session she was left sitting up in chair with chair alarm activated, call bell within reach, no complaints.    BP supine 105/84  BP sit 111/84    Based on the impairments listed above the patient is below her prior level of function. She reports she was ambulating with SPC inside the home and RW outside. Family assists with ADLs.    Patient will benefit from skilled intervention to address the above impairments. Recommend [place] therapy upon discharge.    Functional Outcome Measure:  The patient scored 19/24 on the AMPAC outcome measure which is indicative of pt could go home with home health therapy services.           PLAN :  Recommendations and Planned Interventions:   bed mobility training, transfer training, gait training, therapeutic exercises, neuromuscular re-education, patient and family training/education, and therapeutic activities    Frequency/Duration: Patient will be followed by physical therapy to address goals, PT Plan of Care: 3 times/week to  address goals.    Recommendation for discharge: (in order for the patient to meet his/her long term goals): Therapy 2 days/week in the home and assist for safety    Other factors to consider for discharge: poor safety awareness and high risk for falls    IF patient discharges home will need the following DME: patient owns DME required for discharge       SUBJECTIVE:   Patient stated "I'm so cold, old people get cold easily."    OBJECTIVE DATA SUMMARY:       Past Medical History:   Diagnosis Date     Breast cancer ([place])     Cardiomyopathy ([place])     EF10%; 65% 2022    Malignant pleural effusion     Orthostatic hypotension      Past Surgical History:   Procedure Laterality Date    CARDIAC DEFIBRILLATOR PLACEMENT Left 2012    CHOLECYSTECTOMY      JOINT REPLACEMENT Bilateral     MASTECTOMY, BILATERAL         Home Situation:  Social/Functional History  Lives With: Spouse  Type of Home: Mobile home  Home Layout: One level  Home Access: Stairs to enter with rails  Entrance Stairs - Number of Steps: 3  Entrance Stairs - Rails: Both (too wide to reach both)  Bathroom Shower/Tub: Tourist information centre manager: Freight forwarder: Grab bars in [place]: Radio producer, Radiation protection practitioner (transport chair)  Has the patient had two or more falls in the past year or any fall with injury in the past year?: No  Receives Help From: Family  ADL Assistance: Needs assistance  Ambulation Assistance: Independent  Transfer Assistance: Independent  Active Driver: No  Patient's Driver Info: Pt's spouse or dtr transport pt to her medical appointments     Strength:    Strength: Generally decreased, functional    Tone & Sensation:   Tone: Normal  Sensation: Intact    Coordination:  Coordination: Within functional limits    Range Of Motion:  AROM: Within functional limits       Functional Mobility:  Bed Mobility:     Bed Mobility Training  Bed Mobility Training: Yes  Supine to Sit: Minimum assistance;Additional time;Other (comment) (HOB elevated, assist for LE management)  Transfers:     Transfer Training  Transfer Training: Yes  Interventions: Verbal cues  Sit to Stand: Stand-by assistance  Stand to Sit: Stand-by assistance  Stand Pivot Transfers: Contact-guard assistance  Bed to Chair: Contact-guard assistance  Balance:     Balance  Sitting: Intact  Standing: Impaired  Standing - Static: Good;Constant support  Standing - Dynamic: Good;Constant support  Ambulation/Gait Training:  Mobility limited by elevated HR and cardiac  monitoring  [place] Mobility Inpatient Short Form (6-Clicks) Version 2  How much HELP from another person do you currently need... (If the patient hasn't done an activity recently, how much help from another person do you think they would need if they tried?) Total A Lot A Little None   1.  Turning from your back to your side while in a flat bed without using bedrails? '[]'$   1 '[]'$   2 '[x]'$   3  '[]'$   4   2.  Moving from lying on your back to sitting on the side of a flat bed without using bedrails? '[]'$   1 '[]'$   2 '[x]'$   3  '[]'$   4   3.  Moving to and from a bed to a chair (including a wheelchair)? '[]'$   1 '[]'$   2 '[x]'$   3  '[]'$   4   4. Standing up from a chair using your arms (e.g. wheelchair or bedside chair)? '[]'$   1 '[]'$   2 '[]'$   3  '[x]'$   4   5.  Walking in hospital room? '[]'$   1 '[]'$   2 '[x]'$   3  '[]'$   4   6.  Climbing 3-5 steps with a railing? '[]'$   1 '[]'$   2 '[x]'$   3  '[]'$   4     Raw Score: 19/24                            Cutoff score ?171,2,3 had higher odds of discharging home with home health or need of SNF/IPR.    [place], Jon Gills, Vinoth Debbe Bales, Mena Goes Passek, Roslynn Amble. Annabell Howells.  Validity of the AM-PAC "6-Clicks" Inpatient Daily Activity and Basic Mobility Short Forms. Physical Therapy Mar 2014, [phone]) [phone]; DOI: 10.2522/ptj.20130199  2. Raynelle Chary. Association of AM-PAC "6-Clicks" Basic Mobility and Daily Activity Scores With Discharge Destination. Phys Ther. 2021 Apr 4;101(4):pzab043. doi: 10.1093/ptj/pzab043. PMID: 36144315.  Savage, Rajaraman D, Tanna Furry, Agayby K, [place] S. Activity Measure for Post-Acute Care "6-Clicks" Basic Mobility Scores Predict Discharge Destination After Acute Care Hospitalization in  Select Patient Groups: A Retrospective, Observational Study. Arch Rehabil Res Clin Transl. 2022 Jul 16;4(3):100204. doi: 10.1016/j.arrct.4008.676195. PMID: 09326712; PMCID: [id number].  4. Vonzell Schlatter, Coster W, Ni P. AM-PAC Short Forms Manual 4.0. Revised 04/2018.  Pain Rating:  0/10     Activity Tolerance:   Fair     After treatment:   Patient left in no apparent distress sitting up in chair, Call bell within reach, and Bed/ chair alarm activated    COMMUNICATION/EDUCATION:   The patient's plan of care was discussed with: registered nurse    Patient Education  Education Given To: Patient  Education Provided: Role of Therapy;Plan of Care;Precautions  Education Method: Verbal  Barriers to Learning: Cognition  Education Outcome: Continued education needed    Thank you for this referral.  Alvera Singh, PT  Minutes: 36      Physical Therapy Evaluation Charge Determination   History Examination Presentation Decision-Making   MEDIUM  Complexity : 1-2 comorbidities / personal factors will impact the outcome/ POC  MEDIUM Complexity : 3 Standardized tests and measures addressin body structure, function, activity limitation and / or participation in recreation  MEDIUM Complexity : Evolving with changing characteristics  AM-PAC  LOW    Based on the above components, the patient evaluation is determined to be of the following complexity level: Medium

## 2021-10-10 NOTE — Progress Notes (Signed)
Hospitalist Progress Note    NAME:   Ariel Day   DOB: 02-11-1946   MRN: [id number]     Date/Time: 10/10/2021    Patient PCP: No primary care provider on file.    Estimated discharge date: > 48 hrs  Barriers: Chest tube removal, pulm guidance      Assessment / Plan:      Recurrent large right malignant pleural effusion and moderate left pleural effusion, POA  --1st drainage 7 years ago and 2nd time ~4 years ago.  This would be the third time she requires thoracentesis, last done 07/2020 at [facility] with removal of 786m  --s/p CT insertion by IR on 7/15, completed fluid analysis pending  Plan  --Pain control  --Pulm consulted, expertise appreciated-plans for follow up imaging and monitoring fluid  --Will need close outpatient follow up  --While palliative would be appropriate in this setting to help navigate symptoms, husband states she is not ready to talk about that this admission, will defer for now barring changes in clinical status.     Metastatic breast cancer with mets to liver and bone, POA dx 5 years ago  --cont xeloda (1 dose tonight; pt to provide) on regimen every other week  [place].  Refer to oncology group at discharge     Orthostatic hypotension  --cont midodrine dose increased to 5 mg tid  --cont prednisone '10mg'$  daily  --TED Hose     Chronic diastolic chf, hx of CHFrEF s/p ICD  --Decreased home doses of aldactone and spironolactone for improvement of orthostatic hypotension  --give lasix '20mg'$  IV x 1     Medical Decision Making:   I personally reviewed labs: CBC, BMP  I personally reviewed imaging:   Toxic drug monitoring:   Discussed case with:       Code Status: FULL  DVT Prophylaxis: Lovenox  GI Prophylaxis: none  Baseline: use walker when outside, cane inside home       Subjective:  Assessment / Plan:    Afebrile.  Again, not interested in tunneled cath.  Denies new complaints. Seen with PT at bedside,  severe orthostatics.  Has been asked to see physician outside of the room.  Notes "her mind is going."  States she is not very accepting of diagnosis and debility.  Reports stability has increased since they have been living in mobile home while waiting for in-law suite construction [place]  Offered palliative care support and he declined at this time.  [place]  Offered as a pOrthoptistto call her personally.  He does not have her number in his new phone and deferred for now.  Discussed with RN events overnight.       Objective:      VITALS:   Last 24hrs VS reviewed since prior progress note. Most recent are:  Patient Vitals for the past 24 hrs:   BP Temp Temp src Pulse Resp SpO2   10/10/21 1500 121/71 97.6 F (36.4 C) Oral 94 (!) 32 96 %   10/10/21 1200 124/84 97.8 F (36.6 C) Oral 96 22 --   10/10/21 0730 108/68 97.6 F (36.4 C) Oral 85 22 --   10/10/21 0457 120/60 97.6 F (36.4 C) Oral 94 20 94 %   10/09/21 2216 126/85 97.8 F (36.6 C) Oral 100 27 95 %  10/09/21 1917 121/74 98 F (36.7 C) Oral (!) 102 18 95 %   10/09/21 1730 114/65 -- -- (!) 111 -- --           Intake/Output Summary (Last 24 hours) at 10/10/2021 1656  Last data filed at 10/10/2021 1100  Gross per 24 hour   Intake 840 ml   Output 500 ml   Net 340 ml          I had a face to face encounter and independently examined this patient on 10/10/2021, as outlined below:  PHYSICAL EXAM:  General: WD, WN. No acute distress    EENT:  EOMI. Anicteric sclerae. MMM  Resp:  CTA bilaterally, no wheezing or rales.  No accessory muscle use. R CT   CV:  Regular  rhythm,  No edema  GI/GU:  Soft, Non distended.  +Bowel sounds  Neurologic:  Alert and oriented X 3, normal speech,   Psych:   Not anxious nor agitated  Skin/MSK: No rashes.  No jaundice      Current Inpatient Medications:      Current Facility-Administered Medications:     midodrine  (PROAMATINE) tablet 5 mg, 5 mg, Oral, TID WC, Araceli Bouche, MD, 5 mg at 10/10/21 1647    [START ON 10/11/2021] spironolactone (ALDACTONE) tablet 12.5 mg, 12.5 mg, Oral, Daily, Araceli Bouche, MD    [START ON 10/11/2021] carvedilol (COREG) tablet 3.125 mg, 3.125 mg, Oral, BID WC, Araceli Bouche, MD    enoxaparin (LOVENOX) injection 40 mg, 40 mg, SubCUTAneous, Daily, Araceli Bouche, MD, 40 mg at 10/10/21 0901    oxyCODONE (ROXICODONE) immediate release tablet 5 mg, 5 mg, Oral, Q4H PRN, Joanna Hews, MD, 5 mg at 10/09/21 0852    morphine (PF) injection 1 mg, 1 mg, IntraVENous, Q4H PRN, Joanna Hews, MD, 1 mg at 10/08/21 1751    sodium chloride flush 0.9 % injection 5-40 mL, 5-40 mL, IntraVENous, 2 times per day, Nolon Nations, MD, 10 mL at 10/10/21 0901    sodium chloride flush 0.9 % injection 5-40 mL, 5-40 mL, IntraVENous, PRN, Nolon Nations, MD    0.9 % sodium chloride infusion, , IntraVENous, PRN, Nolon Nations, MD    ondansetron (ZOFRAN-ODT) disintegrating tablet 4 mg, 4 mg, Oral, Q8H PRN **OR** ondansetron (ZOFRAN) injection 4 mg, 4 mg, IntraVENous, Q6H PRN, Nolon Nations, MD    polyethylene glycol (GLYCOLAX) packet 17 g, 17 g, Oral, Daily PRN, Nolon Nations, MD    acetaminophen (TYLENOL) tablet 650 mg, 650 mg, Oral, Q6H PRN, 650 mg at 10/08/21 1625 **OR** acetaminophen (TYLENOL) suppository 650 mg, 650 mg, Rectal, Q6H PRN, Nolon Nations, MD    predniSONE (DELTASONE) tablet 10 mg, 10 mg, Oral, Daily, Nolon Nations, MD, 10 mg at 10/10/21 0900    balsum peru-castor oil (VENELEX) ointment, , Topical, BID, Dara Hoyer., MD, Given at 10/10/21 0901    artificial tears (dextran-hypromellose-glycerin) (GENTEAL) ophthalmic solution 1 drop, 1 drop, Both Eyes, Q4H PRN, Rolla Flatten, PA-C       LABS:    I reviewed today's most current labs and imaging studies.    Pertinent labs include:  Recent Labs     10/08/21  0337 10/09/21  0449 10/10/21  0505   WBC 10.2 13.2* 12.7*   HGB 13.0 13.1 12.3    HCT 39.3 40.1 37.7   PLT 245 231 227       Recent Labs     10/08/21  0337  10/09/21  0449 10/10/21  0505   NA 138 137 135*   K 3.2* 4.2 4.1   CL 102 104 104   CO2 '28 28 27   '$ GLUCOSE 102* 123* 114*   BUN 22* 30* 28*   CREATININE 0.92 0.92 0.82   CALCIUM 9.7 9.9 9.8   MG 2.2  --   --        Xray Result (most recent):  US THORACENTESIS Which side should the procedure be performed? Right  Narrative: PROCEDURE: Ultrasound-guided Thoracentesis    INDICATION: Pleural effusion, dyspnea    OPERATING PHYSICIAN: Omer Jack, M.D.    ESTIMATED BLOOD LOSS: Less than 5cc    SPECIMENS REMOVED: 50 cc of pleural fluid    COMPLICATIONS: None immediate.    Procedure and findings:     The risks and benefits of the procedure were discussed with the patient. Written  consent was obtained. Preliminary ultrasound imaging of the right chest  demonstrated a large pleural effusion. An appropriate site for thoracentesis was  marked on the skin. The patient was prepped and draped in sterile fashion. 1%  lidocaine was injected locally. A dermatotomy was made. A thoracentesis catheter  was then inserted into the pleural space using trocar technique. Approximately  50 ml of yellow fluid was obtained. The catheter was then secured to the skin  using suture and sterile dressing, then attached to water seal canister for  ongoing drainage. The patient tolerated the procedure well. There were no  immediate complications.  Impression: Successful ultrasound guided right thoracentesis, the catheter was  left in place for ongoing drainage..  XR CHEST PORTABLE  Narrative: EXAM: XR CHEST PORTABLE    DATE: 10/10/2021 5:35 AM    INDICATION: f/u for chest tube insertion on 7/15    COMPARISON: Chest radiograph from 10/09/2021    TECHNIQUE: AP portable upright chest plain film    FINDINGS:     Left chest pacemaker-defibrillator. Partially imaged right chest tube.    The cardiac silhouette is normal in size and contour.    Diffuse bilateral interstitial  opacification. Mild worsening right basilar  airspace disease. Stable left basilar airspace disease. Previously seen right  apical miniscule pleural separation is no longer evident.  Impression: 1.  Ongoing bilateral airspace disease, slightly worsened in the right.  2.  No discrete pneumothorax.  3.  Other findings are stable.        Microbiology:  Results       ** No results found for the last 336 hours. **          ECHO:   No results found for this or any previous visit.    Procedures: see electronic medical records for all procedures/Xrays and details which were not copied into this note but were reviewed prior to creation of Plan.    Reviewed most current lab test results and cultures  YES  Reviewed most current radiology test results   YES  Review and summation of old records today    NO  Reviewed patient's current orders and MAR    YES  PMH/SH reviewed - no change compared to H&P  ________________________________________________________________________    ________________________________________________________________________  Total NON critical care TIME:    Minutes      Total CRITICAL CARE TIME Spent:   Minutes non procedure based        Comments   >50% of visit spent in counseling and coordination of care x    ________________________________________________________________________  Signed: Araceli Bouche, MD

## 2021-10-10 NOTE — Plan of Care (Signed)
Problem: Occupational Therapy - Adult  Goal: By Discharge: Performs self-care activities at highest level of function for planned discharge setting.  See evaluation for individualized goals.  Description: FUNCTIONAL STATUS PRIOR TO ADMISSION:  Patient was performing self feeding, grooming, toileting and UB dressing at an independent level, she was mod I for UB bathing due to use of shower seat, she is max A for LB bathing and was performing LB dressing with mod A. She ambulates with a RW.   HOME SUPPORT: Patient was living her husband who provided any needed assistance.     Occupational Therapy Goals:  Initiated 10/10/2021  1.  Patient will perform grooming standing at sink with Set-up and Supervision within 7 day(s).  2.  Patient will perform upper body dressing with Set-up and Supervision within 7 day(s).  3.  Patient will perform lower body dressing with Set-up and Supervision, using AE, within 7 day(s).  4.  Patient will perform toilet transfers with Supervision  within 7 day(s).  5.  Patient will perform all aspects of toileting with Supervision within 7 day(s).  6.  Patient will sponge bathing with Set-up and Supervision, using a LH sponge PRN, within 7 day(s).     Outcome: Progressing      OCCUPATIONAL THERAPY EVALUATION    Patient: Ariel Day (76 y.o. female)  Date: 10/10/2021  Primary Diagnosis: Pleural effusion [J90]         Precautions: Fall Risk (Orthostatic/h/o POTS)                  ASSESSMENT :  The patient is limited by GW, decreased activity tolerance, decreased safety awareness and decreased balance which is impairing her functional independence.  She has a h/o POTS, but her drop in BP with sitting EOB was significant today, in the 70s/50s. At baseline she is ambulatory with a RW and performs most ADLs at an independent to mod I level, with the exception of needing mod to max A for LB ADLs. Currently she is functioning below her baseline, now at an independent to max A level for ADLs,  demonstrating the ability to improve upon her LB ADL independence with AE training. In regard to functional mobility she is min A for bed mobility and sit to stand transfers. At this time her orthostatic hypotension is greatly impacting her functional performance and further therapy needs for after discharge will depend on her progress once her BP is more stable with positional changes. She will continue to benefit from acute OT and may need SNF rehab VS HHOT and PT after discharge, depending on her progress acutely.        PLAN :  Recommendations and Planned Interventions:   self care training, therapeutic activities, functional mobility training, balance training, therapeutic exercise, endurance activities, patient education, home safety training, and family training/education    Frequency/Duration: OT Plan of Care: 3 times/week    Recommendation for discharge: (in order for the patient to meet his/her long term goals): Continue to assess pending progress    IF patient discharges home will need the following DME: continuing to assess with progress       OBJECTIVE DATA SUMMARY:     Past Medical History:   Diagnosis Date    Breast cancer ([place])     Cardiomyopathy ([place])     EF10%; 65% 2022    Malignant pleural effusion     Orthostatic hypotension      Past Surgical History:   Procedure Laterality Date  CARDIAC DEFIBRILLATOR PLACEMENT Left 2012    CHOLECYSTECTOMY      JOINT REPLACEMENT Bilateral     MASTECTOMY, BILATERAL            Expanded or extensive additional review of patient history:   Lives With: Spouse  Type of Home: Mobile home  Home Layout: One level  Home Access: Stairs to enter with rails     Entrance Stairs - Rails: Both (too wide to reach both)  Bathroom Shower/Tub: Tourist information centre manager: Freight forwarder: Grab bars in shower     Home Equipment: Radio producer, Radiation protection practitioner (transport chair)  Has the patient had two or more falls in the past year or any fall with injury in the past  year?: No        Hand Dominance: right     EXAMINATION OF PERFORMANCE DEFICITS:    Cognitive/Behavioral Status:  Orientation  Overall Orientation Status: Impaired  Orientation Level: Oriented to place;Oriented to situation;Oriented to person  Cognition  Overall Cognitive Status: Exceptions  Attention Span: Appears intact  Safety Judgement: Decreased awareness of need for safety    Hearing:        Vision/Perceptual:    Vision - Basic Assessment  Prior Vision: Wears glasses only for reading             Range of Motion:   AROM: Generally decreased, functional         Strength:    Generally decreased, functional      Coordination:  Coordination: Generally decreased, functional     Coordination: Within functional limits      Tone & Sensation:   Tone: Normal  Sensation: Intact          Functional Mobility and Transfers for ADLs:    Bed Mobility:     Bed Mobility Training  Bed Mobility Training: Yes  Rolling: Minimum assistance  Supine to Sit: Minimum assistance  Sit to Supine: Minimum assistance  Scooting: Minimum assistance    Transfers:     Pharmacologist: Yes  Interventions: Verbal cues  Sit to Stand: Minimum assistance  Stand to Sit: Minimum assistance.    Balance:  Standing: Impaired  Balance  Sitting: Impaired  Sitting - Static: Good (unsupported)  Sitting - Dynamic: Fair (occasional)  Standing: Impaired  Standing - Static: Fair  Standing - Dynamic: Poor;Constant support        ADL Assessment:     Feeding: Independent       Grooming: Supervision;Setup (seated)       UE Bathing: Minimal assistance;Increased time to complete       LE Bathing: Moderate assistance;Increased time to complete       UE Dressing: Moderate assistance       LE Dressing: Maximum assistance       Toileting: Maximum assistance           Pain Rating:  No complaint of pain      Activity Tolerance:   Poor  Limited by orthostatic hypotension  BP supine: 108/74  BP sitting: 71/50  BP sitting after 2 minutes of light functional  activity: 76/53  BP after returning to supine: 124/84  HR up to 118  After treatment:   Patient left in no apparent distress in bed, Call bell within reach, Bed/ chair alarm activated, and Caregiver / family present    COMMUNICATION/EDUCATION:   The patient's plan of care was discussed with: physical therapist, registered nurse, and physician  Patient Education  Education Given To: Patient;Family  Education Provided: Role of Therapy;Plan of Care  Education Method: Verbal  Barriers to Learning: None  Education Outcome: Verbalized understanding    Thank you for this referral.  Pricilla Moehle P Marty Uy, OTR/L  Minutes: 26

## 2021-10-10 NOTE — Progress Notes (Signed)
Name: Ariel Day   DOB: [birth year] Admit Date: 10/07/2021   Phone: [phone]  Room: 2326/01   PCP: No primary care provider on file.  MRN: [id number]   Date: 10/10/2021  Code: Full Code          Chart and notes reviewed. Data reviewed. I review the patient's current medications in the medical record at each encounter.  I have evaluated and examined the patient.     10/10/21 : Sitting up in chair on RA. Husband at bedside. Pleural drain draining serosanguinous fluid. Pleurevac around 1874m. We discussed management options. She would like to avoid tunnelled drain placement if at all possible. Edema improving.     HPI:    10:46 AM       History was obtained from patient and spouse/SO.      I was asked by Ariel Hoyer, MD to see Ariel Day consultation for a chief complaint of pleural effusion and chest tube management.      History of Present Illness:    Presented to ED on 10/07/21 with complaints of significant SOB. She lives with her daughter who is a PhD in the field of Neuroscience. Her daughter advised her to come to ED for further evaluation. Large pleural effusion note upon arrival to ED. Chest tube placed. No history of lung disease.     She has had thoracentesis performed 2 x in the past. First time was about 7 years ago and last time was about 2-3 years ago.     She is feeling much better following chest tube placement. She denies SOB, cough, wheezing. No pain.     Afebrile  Slightly tachycardic  BP stable  Oxygen saturations stable on RA (has not needed supplemental O2 during this admission)  Na 137  K 4.2  Pro-BNP 489  WBC 13.2  1520 mL total chest tube output since placement 10/08/21  Only 20 mL output from chest tube overnight  Pleural fluid appears exudative    Images reviewed:    CXR 10/09/21: report pending    CXR 10/07/21: resolved right pleural effusion with a probable right chest tube in place; tiny  right pneumothorax versus trapped lung;  stable small left pleural effusion.    CTA Chest 10/07/21: no evidence of pulmonary embolism; large right pleural effusion, increased in size; moderate left pleural effusion, not significantly changed; bilateral lower lobe atelectasis, right worse than left; diffuse osseous and hepatic metastatic disease.    Past Medical History:   Diagnosis Date    [place]     [place]     EF10%; 65% 2022    Malignant pleural effusion     Orthostatic hypotension        Past Surgical History:   Procedure Laterality Date    CARDIAC DEFIBRILLATOR PLACEMENT Left 2012    CHOLECYSTECTOMY      JOINT REPLACEMENT Bilateral     MASTECTOMY, BILATERAL         Family History   Problem Relation Age of Onset    Heart Disease Mother     Breast Cancer Mother     Heart Disease Father        Social History     Tobacco Use    Smoking status: Never    Smokeless tobacco: Not on file   Substance Use Topics    Alcohol use: Not Currently       No Known Allergies    Current  Facility-Administered Medications   Medication Dose Route Frequency    enoxaparin (LOVENOX) injection 40 mg  40 mg SubCUTAneous Daily    oxyCODONE (ROXICODONE) immediate release tablet 5 mg  5 mg Oral Q4H PRN    morphine (PF) injection 1 mg  1 mg IntraVENous Q4H PRN    sodium chloride flush 0.9 % injection 5-40 mL  5-40 mL IntraVENous 2 times per day    sodium chloride flush 0.9 % injection 5-40 mL  5-40 mL IntraVENous PRN    0.9 % sodium chloride infusion   IntraVENous PRN    ondansetron (ZOFRAN-ODT) disintegrating tablet 4 mg  4 mg Oral Q8H PRN    Or    ondansetron (ZOFRAN) injection 4 mg  4 mg IntraVENous Q6H PRN    polyethylene glycol (GLYCOLAX) packet 17 g  17 g Oral Daily PRN    acetaminophen (TYLENOL) tablet 650 mg  650 mg Oral Q6H PRN    Or    acetaminophen (TYLENOL) suppository 650 mg  650 mg Rectal Q6H PRN    carvedilol (COREG) tablet 6.25 mg  6.25 mg Oral BID WC    midodrine (PROAMATINE) tablet 2.5 mg  2.5 mg Oral BID WC    predniSONE (DELTASONE) tablet  10 mg  10 mg Oral Daily    spironolactone (ALDACTONE) tablet 25 mg  25 mg Oral Daily    balsum peru-castor oil (VENELEX) ointment   Topical BID    artificial tears (dextran-hypromellose-glycerin) (GENTEAL) ophthalmic solution 1 drop  1 drop Both Eyes Q4H PRN         REVIEW OF SYSTEMS   12 point ROS negative except as stated in the HPI.      Physical Exam:   Vitals:    10/10/21 0730   BP: 108/68   Pulse: 85   Resp: 22   Temp: 97.6 F (36.4 C)   SpO2:        General:  Alert, cooperative, no distress, appears stated age.   Head:  Normocephalic, without obvious abnormality, atraumatic.   Eyes:  Conjunctivae/corneas clear.    Nose: Nares normal. Septum midline. Mucosa normal.    Throat: Lips, mucosa, and tongue normal.    Neck: Supple, symmetrical, trachea midline, no adenopathy.   Lungs:   Clear to auscultation bilaterally; very mildly diminished at bilateral bases   Chest wall:  No tenderness or deformity.   Heart:  Regular rate and rhythm, S1, S2 normal, no murmur, click, rub or gallop.   Abdomen:   Soft, non-tender. Bowel sounds normal. No masses,  No organomegaly.   Extremities: Extremities normal, atraumatic, no cyanosis or edema.   Pulses: 2+ and symmetric all extremities.   Skin: Skin color, texture, turgor normal. No rashes or lesions   Lymph nodes: Cervical, supraclavicular nodes normal.   Neurologic: Grossly nonfocal       Lab Results   Component Value Date/Time    NA 135 10/10/2021 05:05 AM    K 4.1 10/10/2021 05:05 AM    CL 104 10/10/2021 05:05 AM    CO2 27 10/10/2021 05:05 AM    BUN 28 10/10/2021 05:05 AM    MG 2.2 10/08/2021 03:37 AM       Lab Results   Component Value Date/Time    WBC 12.7 10/10/2021 05:05 AM    HGB 12.3 10/10/2021 05:05 AM    PLT 227 10/10/2021 05:05 AM    MCV 105.0 10/10/2021 05:05 AM       Lab Results   Component Value  Date/Time    INR 1.1 10/07/2021 03:37 AM    APTT 23.1 10/07/2021 03:37 AM    GLOB 3.8 10/07/2021 03:39 PM       No results found for: IRON, TIBC, IBCT, FERR    No  results found for: CRP, ANA, RA, RPR, RPRT, VDRLS, TSH     '@BRIEFLAB24'$ (ph,phi,pco2,pco2i,po2,po2i,hco3,hco3i,fio2,fio2i)@    Lab Results   Component Value Date/Time    BNP 550 03/14/2021 07:23 PM        No components found for: CULT    No results found for: CMV, RPR, HBCM, HBSAG, HAAB, HCAB1, G6PD, HIVR, HIV1, HIV12, HIVPC, HIVRPI    No results found for: CPK    No components found for: COLOR, APPRN, SPGRU, PHU, PROTU, GLUCU, KETU, BILU, BLDU, UROU, NITU, LEUKU, WBCU, RBCU, UEPI, BACTU, CASTS, UCRY    IMPRESSION  SOB  Pleural Effusion s/p chest tube placement 7/15  Stage IV Metastatic Breast Cancer  CHF/Cardiomyopathy s/p ICD    PLAN  Maintain oxygen saturations > 90%; currently on RA  Chest tube in place  F/U imaging  Currently on oral chemo from Dr. Stephens November in [place], [place]  Will plan to remove drain once volume status improved. We discussed considering pleural drain placement, it has been years since last thoracentesis. She will certainly need close monitoring once discharged for recurrence. She would like to defer tunnelled pleural drain placement for now.   F/U cytology pleural fluid    Thank you for allowing [place] to participate in the care of this patient.  We will be happy to follow along in her progress with you.    Winona Legato, APRN - NP

## 2021-10-11 ENCOUNTER — Inpatient Hospital Stay: Admit: 2021-10-11 | Payer: MEDICARE | Primary: Family Medicine

## 2021-10-11 LAB — BASIC METABOLIC PANEL
Anion Gap: 3 mmol/L — ABNORMAL LOW (ref 5–15)
BUN: 27 MG/DL — ABNORMAL HIGH (ref 6–20)
Bun/Cre Ratio: 30 — ABNORMAL HIGH (ref 12–20)
CO2: 28 mmol/L (ref 21–32)
Calcium: 9.7 MG/DL (ref 8.5–10.1)
Chloride: 104 mmol/L (ref 97–108)
Creatinine: 0.91 MG/DL (ref 0.55–1.02)
Est, Glom Filt Rate: >60 mL/min/{1.73_m2} (ref >60)
Glucose: 92 mg/dL (ref 65–100)
Potassium: 4.3 mmol/L (ref 3.5–5.1)
Sodium: 135 mmol/L — ABNORMAL LOW (ref 136–145)

## 2021-10-11 LAB — CBC WITH AUTO DIFFERENTIAL
Absolute Immature Granulocyte: 0.2 10*3/uL — ABNORMAL HIGH (ref 0.00–0.04)
Basophils %: 0 % (ref 0–1)
Basophils Absolute: 0 10*3/uL (ref 0.0–0.1)
Eosinophils %: 1 % (ref 0–7)
Eosinophils Absolute: 0.1 10*3/uL (ref 0.0–0.4)
Hematocrit: 35.3 % (ref 35.0–47.0)
Hemoglobin: 11.5 g/dL (ref 11.5–16.0)
Immature Granulocytes: 2 % — ABNORMAL HIGH (ref 0.0–0.5)
Lymphocytes %: 13 % (ref 12–49)
Lymphocytes Absolute: 1.6 10*3/uL (ref 0.8–3.5)
MCH: 33.5 PG (ref 26.0–34.0)
MCHC: 32.6 g/dL (ref 30.0–36.5)
MCV: 102.9 FL — ABNORMAL HIGH (ref 80.0–99.0)
MPV: 10.6 FL (ref 8.9–12.9)
Monocytes %: 10 % (ref 5–13)
Monocytes Absolute: 1.2 10*3/uL — ABNORMAL HIGH (ref 0.0–1.0)
Neutrophils %: 74 % (ref 32–75)
Neutrophils Absolute: 9 10*3/uL — ABNORMAL HIGH (ref 1.8–8.0)
Nucleated RBCs: 0 PER 100 WBC
Platelets: 254 10*3/uL (ref 150–400)
RBC: 3.43 M/uL — ABNORMAL LOW (ref 3.80–5.20)
RDW: 17.3 % — ABNORMAL HIGH (ref 11.5–14.5)
WBC: 12.1 10*3/uL — ABNORMAL HIGH (ref 3.6–11.0)
nRBC: 0 10*3/uL (ref 0.00–0.01)

## 2021-10-11 MED ORDER — SODIUM CHLORIDE 0.9 % IV SOLN
0.9 % | INTRAVENOUS | Status: AC
Start: 2021-10-11 — End: 2021-10-12
  Administered 2021-10-11: 21:00:00 via INTRAVENOUS

## 2021-10-11 MED FILL — CARVEDILOL 3.125 MG PO TABS: 3.125 MG | ORAL | Qty: 1

## 2021-10-11 MED FILL — SPIRONOLACTONE 25 MG PO TABS: 25 MG | ORAL | Qty: 1

## 2021-10-11 MED FILL — MIDODRINE HCL 5 MG PO TABS: 5 MG | ORAL | Qty: 1

## 2021-10-11 MED FILL — ENOXAPARIN SODIUM 40 MG/0.4ML IJ SOSY: 40 MG/0.4ML | INTRAMUSCULAR | Qty: 0.4

## 2021-10-11 MED FILL — PREDNISONE 5 MG PO TABS: 5 MG | ORAL | Qty: 2

## 2021-10-11 NOTE — Progress Notes (Signed)
Hospitalist Progress Note    NAME:   Ariel Day   DOB: 1945/05/03   MRN: [id number]     Date/Time: 10/11/2021    Patient PCP: No primary care provider on file.    Estimated discharge date: 7/19  Barriers: Improvement of orthostatic hypotension      Assessment / Plan:      Recurrent large right malignant pleural effusion and moderate left pleural effusion, POA  --1st drainage 7 years ago and 2nd time ~4 years ago.  This would be the third time she requires thoracentesis, last done 07/2020 at [facility] with removal of 749m  --s/p CT insertion by IR on 7/15, completed fluid analysis pending  Plan  --Pain control  --Pulm consulted, expertise appreciated  -- S/p drain removal 7/18  --Will need close outpatient follow up     Metastatic breast cancer with mets to liver and bone, POA dx 5 years ago  --cont xeloda (1 dose tonight; pt to provide) on regimen every other week  [place].  Refer to oncology group at discharge--curbsided JNaaman Plummerto set up New Patient appointment     Orthostatic hypotension  --cont midodrine dose increased to 5 mg tid  --cont prednisone '10mg'$  daily  --TED Hose  --Gentle IVF overnight     Chronic diastolic chf, hx of CHFrEF s/p ICD  --Decreased home doses of aldactone and spironolactone for improvement of orthostatic hypotension  --give lasix '20mg'$  IV x 1     Medical Decision Making:   I personally reviewed labs: CBC, BMP  I personally reviewed imaging:   Toxic drug monitoring:   Discussed case with:       Code Status: FULL  DVT Prophylaxis: Lovenox  GI Prophylaxis: none  Baseline: use walker when outside, cane inside home       Subjective:  Assessment / Plan:    Afebrile.  No new complaints. Denies CP, SOB, and abdominal pain.  Discussed with RN events overnight.       Objective:      VITALS:   Last 24hrs VS reviewed since prior progress note. Most recent are:  Patient Vitals for the past 24 hrs:   BP  Temp Temp src Pulse Resp SpO2   10/11/21 1500 -- 97.8 F (36.6 C) Oral -- -- --   10/11/21 1100 135/75 97.8 F (36.6 C) Oral (!) 107 22 --   10/11/21 0735 128/68 98 F (36.7 C) Oral 86 25 95 %   10/11/21 0315 110/61 98 F (36.7 C) -- 86 27 93 %   10/10/21 2310 107/65 97.5 F (36.4 C) -- 96 22 95 %   10/10/21 1902 125/67 97.5 F (36.4 C) Oral (!) 101 27 (!) 83 %           Intake/Output Summary (Last 24 hours) at 10/11/2021 1635  Last data filed at 10/10/2021 2310  Gross per 24 hour   Intake --   Output 100 ml   Net -100 ml          I had a face to face encounter and independently examined this patient on 10/11/2021, as outlined below:  PHYSICAL EXAM:  General: WD, WN. No acute distress    EENT:  EOMI. Anicteric sclerae. MMM  Resp:  CTA bilaterally, no wheezing or rales.  No accessory muscle use  CV:  Regular  rhythm,  No edema  GI/GU:  Soft, Non distended.  +Bowel sounds  Neurologic:  Alert and oriented X 3, normal speech,   Psych:   Not anxious nor agitated  Skin/MSK: No rashes.  No jaundice      Current Inpatient Medications:      Current Facility-Administered Medications:     0.9 % sodium chloride infusion, , IntraVENous, Continuous, Araceli Bouche, MD    midodrine (PROAMATINE) tablet 5 mg, 5 mg, Oral, TID WC, Araceli Bouche, MD, 5 mg at 10/11/21 1129    spironolactone (ALDACTONE) tablet 12.5 mg, 12.5 mg, Oral, Daily, Araceli Bouche, MD, 12.5 mg at 10/11/21 8295    carvedilol (COREG) tablet 3.125 mg, 3.125 mg, Oral, BID WC, Araceli Bouche, MD, 3.125 mg at 10/11/21 0821    enoxaparin (LOVENOX) injection 40 mg, 40 mg, SubCUTAneous, Daily, Araceli Bouche, MD, 40 mg at 10/11/21 6213    oxyCODONE (ROXICODONE) immediate release tablet 5 mg, 5 mg, Oral, Q4H PRN, Joanna Hews, MD, 5 mg at 10/09/21 0852    morphine (PF) injection 1 mg, 1 mg, IntraVENous, Q4H PRN, Joanna Hews, MD, 1 mg at 10/08/21 1751    sodium chloride flush 0.9 % injection 5-40 mL, 5-40 mL, IntraVENous, 2 times per day, Nolon Nations, MD, 10 mL at 10/11/21 0865    sodium chloride flush 0.9 % injection 5-40 mL, 5-40 mL, IntraVENous, PRN, Nolon Nations, MD    0.9 % sodium chloride infusion, , IntraVENous, PRN, Nolon Nations, MD    ondansetron (ZOFRAN-ODT) disintegrating tablet 4 mg, 4 mg, Oral, Q8H PRN **OR** ondansetron (ZOFRAN) injection 4 mg, 4 mg, IntraVENous, Q6H PRN, Nolon Nations, MD    polyethylene glycol (GLYCOLAX) packet 17 g, 17 g, Oral, Daily PRN, Nolon Nations, MD    acetaminophen (TYLENOL) tablet 650 mg, 650 mg, Oral, Q6H PRN, 650 mg at 10/08/21 1625 **OR** acetaminophen (TYLENOL) suppository 650 mg, 650 mg, Rectal, Q6H PRN, Nolon Nations, MD    predniSONE (DELTASONE) tablet 10 mg, 10 mg, Oral, Daily, Nolon Nations, MD, 10 mg at 10/11/21 7846    balsum peru-castor oil (VENELEX) ointment, , Topical, BID, Dara Hoyer., MD, Given at 10/11/21 [phone]    artificial tears (dextran-hypromellose-glycerin) (GENTEAL) ophthalmic solution 1 drop, 1 drop, Both Eyes, Q4H PRN, Rolla Flatten, PA-C       LABS:    I reviewed today's most current labs and imaging studies.    Pertinent labs include:  Recent Labs     10/09/21  0449 10/10/21  0505 10/11/21  0515   WBC 13.2* 12.7* 12.1*   HGB 13.1 12.3 11.5   HCT 40.1 37.7 35.3   PLT 231 227 254       Recent Labs     10/09/21  0449 10/10/21  0505 10/11/21  0515   NA 137 135* 135*   K 4.2 4.1 4.3   CL 104 104 104   CO2 '28 27 28   '$ GLUCOSE 123* 114* 92   BUN 30* 28* 27*   CREATININE 0.92 0.82 0.91   CALCIUM 9.9 9.8 9.7       Xray Result (most recent):  IR REMOVE LUNG/PLEURAL DRAIN/CATHETER  Interventional Radiology Procedure Note  ?  Procedure Date:??10/11/2021  ?  Procedure:??Chest tube removal  ?  Operator:??Janice Trump, NP  ?  Attending:??Omer Jack, MD????????  ?  Preoperative Diagnosis:??Right pleural effusion  ?  Postoperative Diagnosis:??Right pleural effusion; resolved  ?  Technique:??The skin was prepped and draped in sterile fashion. ?The?suture to  the right?chest safety centesis  catheter?was?removed  and?then?the catheter  was?removed. ?Pressure was applied locally at the dermatotomy site. ?A dry  sterile occlusive dressing was applied.  ?  Complications:?None  ?  Estimated Blood Loss:??<5m  ?  Specimens:??None  ?  Procedure Findings:??Successful removal of the right?chest tube.   ?  Condition:??The patient tolerated the procedure without difficulty and remained  in stable condition throughout.   XR CHEST PORTABLE  Narrative: EXAM:  XR CHEST PORTABLE    INDICATION: Pleural effusion    COMPARISON: Earlier same day    TECHNIQUE: portable chest AP view    FINDINGS: The cardiac silhouette is within normal limits. AICD is stable from  the left. The pulmonary vasculature is within normal limits.     Lung volumes are moderate with left pleural effusion stable. Mild interstitial  prominence. No focal infiltrate. The visualized bones and upper abdomen are  age-appropriate, except for stable nonaggressive lesions in both proximal  humeri.. Pigtail catheter projects over the right lung base without change.  Impression: Stable left pleural effusion and mild interstitial prominence.  XR CHEST PORTABLE  Narrative: PORTABLE CHEST RADIOGRAPH/S: 10/11/2021 5:22 AM    INDICATION: Chest tube placement on 10/08/2021.    COMPARISON: 10/10/2021, 10/09/2021, 10/08/2021. CT chest 10/07/2021.    TECHNIQUE: Portable frontal semiupright radiograph/s of the chest.    FINDINGS:   A right pleural pigtail catheter remains in place. No right pleural effusion or  pneumothorax. Passive atelectasis is associated with a small left pleural  effusion. The lungs are otherwise clear. The central airways are patent. No  pneumothorax.    A pacemaker/AICD is in place, with transvenous electrodes/AICD coil, and  transthoracic, epicardial electrodes. There are osseous metastases in the  bilateral humeri. Multiple bilateral rib fractures are likely chronic.  Impression: Small left pleural effusion. No residual right effusion. Osseous  metastases.     Microbiology:  Results       ** No results found for the last 336 hours. **          ECHO:   No results found for this or any previous visit.    Procedures: see electronic medical records for all procedures/Xrays and details which were not copied into this note but were reviewed prior to creation of Plan.    Reviewed most current lab test results and cultures  YES  Reviewed most current radiology test results   YES  Review and summation of old records today    NO  Reviewed patient's current orders and MAR    YES  PMH/SH reviewed - no change compared to H&P  ________________________________________________________________________    ________________________________________________________________________  Total NON critical care TIME:    Minutes      Total CRITICAL CARE TIME Spent:   Minutes non procedure based        Comments   >50% of visit spent in counseling and coordination of care x    ________________________________________________________________________      Signed: MAraceli Bouche MD

## 2021-10-11 NOTE — Progress Notes (Signed)
Interventional Radiology Procedure Note    Procedure Date:  10/11/2021    Procedure:  Chest tube removal    Operator:  Lajuana Carry, NP    Attending:  Omer Jack, MD     Preoperative Diagnosis:  Right pleural effusion    Postoperative Diagnosis:  Right pleural effusion;resolved    Technique:  The skin was prepped and draped in sterile fashion.  The suture to the right chest safety centesis catheter was removed and then the catheter was removed.  Pressure was applied locally at the dermatotomy site.  A dry sterile occlusive dressing was applied.    Complications: None    Estimated Blood Loss:  <15m    Specimens:  None    Procedure Findings:  Successful removal of the right chest tube.     Condition:  The patient tolerated the procedure without difficulty and remained in stable condition throughout.

## 2021-10-11 NOTE — Plan of Care (Addendum)
1900: Bedside shift change report given to Wal[place] (oncoming nurse) by Gus Puma RN (offgoing nurse). Report included the following information Nurse Handoff Report.     0700: End of Shift Note    Bedside shift change report given to [place] (oncoming nurse) by Rosalia Hammers, RN (offgoing nurse).  Report included the following information SBAR, Intake/Output, MAR, Recent Results, and Cardiac Rhythm V Paced    Shift worked:  1900-700     Shift summary and any significant changes:     No significant events overnight     Concerns for physician to address:       Zone phone for oncoming shift:          Activity:     Number times ambulated in hallways past shift: 0  Number of times OOB to chair past shift: 0    Cardiac:   Cardiac Monitoring: Yes           Access:  Current line(s): PIV     Genitourinary:   Urinary status: voiding and external catheter    Respiratory:      Chronic home O2 use?: NO  Incentive spirometer at bedside: YES       GI:     Current diet:  ADULT ORAL NUTRITION SUPPLEMENT; Dinner; Frozen Oral Supplement  ADULT DIET; Regular; Low Fat/Low Chol/High Fiber/NAS; No Added Salt (3-4 gm)  DIET ONE TIME MESSAGE;  Passing flatus: YES  Tolerating current diet: YES       Pain Management:   Patient states pain is manageable on current regimen: YES    Skin:     Interventions: increase time out of bed    Patient Safety:  Fall Score:    Interventions: bed/chair alarm, assistive device (walker, cane. etc), gripper socks, and pt to call before getting OOB       Length of Stay:  Expected LOS: 3  Actual LOS: [place], RN                              Care Plan    Problem: Discharge Planning  Goal: Discharge to home or other facility with appropriate resources  Outcome: Progressing     Problem: Skin/Tissue Integrity  Goal: Absence of new skin breakdown  Description: 1.  Monitor for areas of redness and/or skin breakdown  2.  Assess vascular access sites hourly  3.  Every 4-6 hours minimum:  Change oxygen saturation  probe site  4.  Every 4-6 hours:  If on nasal continuous positive airway pressure, respiratory therapy assess nares and determine need for appliance change or resting period.  Outcome: Progressing     Problem: Safety - Adult  Goal: Free from fall injury  Outcome: Progressing     Problem: ABCDS Injury Assessment  Goal: Absence of physical injury  Outcome: Progressing     Problem: Pain  Goal: Verbalizes/displays adequate comfort level or baseline comfort level  Outcome: Progressing

## 2021-10-11 NOTE — Plan of Care (Signed)
Problem: Physical Therapy - Adult  Goal: By Discharge: Performs mobility at highest level of function for planned discharge setting.  See evaluation for individualized goals.  Description: FUNCTIONAL STATUS PRIOR TO ADMISSION: Patient was modified independent using a SPC inside and RW outside for functional mobility. She also has a transport chair for when she goes to MD appointments. Family assists with ADLs.     HOME SUPPORT PRIOR TO ADMISSION: The patient lives with her husband in a mobile home on her daughter's property while the "in-law" suite is finishing being built. There are 3 steps with B rails (too wide to reach both) to get in.     Physical Therapy Goals  Initiated 10/10/2021  1.  Patient will move from supine to sit and sit to supine in bed with supervision/set-up within 7 day(s).    2.  Patient will perform sit to stand with modified independence within 7 day(s).  3.  Patient will transfer from bed to chair and chair to bed with supervision/set-up using the least restrictive device within 7 day(s).  4.  Patient will ambulate with supervision/set-up for 150 feet with the least restrictive device within 7 day(s).   5.  Patient will ascend/descend 3 stairs with 1 handrail(s) with supervision/set-up within 7 day(s).   Outcome: Progressing   PHYSICAL THERAPY TREATMENT    Patient: Ariel Day (76 y.o. female)  Date: 10/11/2021  Diagnosis: Pleural effusion [J90] Pleural effusion      Precautions: Fall Risk (Orthostatic/h/o POTS)                    ASSESSMENT:  Patient continues to benefit from skilled PT services and is progressing towards goals, but remains limited by symptomatic orthostatic hypotension.  RN reports she just received midodrine and now has on ted hose Bly.  Pt in bed, agreeable to PT session.  BP supine with hob elevated 122/64.  Had pt perform ankle pumps.  Pt able to mobilize to sitting eob without physical assistance and performed more ankle pumps and marches seated.  BP assessed and was  100/63.  Pt stood eob unassisted and systolic BP slightly increased some to 108/61.  Gait distance increased some this visit, but pt reported feeling dizzy.  Had pt sit at the foot of the bed and BP assessed and was 135/75.  Pt sat a few minutes and then ambulated a short distance to the bedside chair.  BP seated in chair 141/71.  Pt left seated in recliner and RN informed of her status.  Pt moving well and should be able to discharge home with family assistance as needed.         PLAN:  Patient continues to benefit from skilled intervention to address the above impairments.  Continue treatment per established plan of care.    Recommendation for discharge: (in order for the patient to meet his/her long term goals): Therapy 2 days/week in the home    Other factors to consider for discharge: no additional factors    IF patient discharges home will need the following DME: patient owns DME required for discharge       SUBJECTIVE:   Patient stated, "I feel dizzy." (while ambulating)    OBJECTIVE DATA SUMMARY:   Critical Behavior:          Functional Mobility Training:  Bed Mobility:  Bed Mobility Training  Rolling: Contact-guard assistance;Adaptive equipment;Additional time (HOB elevated)  Supine to Sit: Contact-guard assistance  Scooting: Stand-by assistance  Transfers:  Training and development officer  Interventions: Verbal cues  Sit to Stand: Stand-by assistance  Stand to Sit: Stand-by assistance  Stand Pivot Transfers: Stand-by assistance  Bed to Chair: Contact-guard assistance;Minimum assistance (to manage RW)  Balance:  Balance  Sitting: Intact  Standing: Impaired  Standing - Static: Good;Constant support  Standing - Dynamic: Good;Constant support   Ambulation/Gait Training:     Gait  Overall Level of Assistance: Contact-guard assistance  Base of Support: Widened  Speed/Cadence: Slow  Step Length: Right shortened;Left shortened  Gait Abnormalities: Decreased step clearance  Distance (ft): 10 Feet  Assistive Device: Gait  belt;Walker, rolling      Activity Tolerance:   Fair  and signs and symptoms of orthostatic hypotension    After treatment:   Patient left in no apparent distress sitting up in chair, Call bell within reach, and Caregiver / family present      COMMUNICATION/EDUCATION:   The patient's plan of care was discussed with: registered nurse           Bobbie Stack, PT  Minutes: [phone]

## 2021-10-11 NOTE — Progress Notes (Addendum)
Name: Ariel Day   DOB: 04/08/1945 Admit Date: 10/07/2021   Phone: [phone]  Room: 2326/01   PCP: No primary care provider on file.  MRN: [id number]   Date: 10/11/2021  Code: Full Code          Chart and notes reviewed. Data reviewed. I review the patient's current medications in the medical record at each encounter.  I have evaluated and examined the patient.     10/10/21 : Sitting up in chair on RA. Husband at bedside. Pleural drain draining serosanguinous fluid. Pleurevac around 1825m. We discussed management options. She would like to avoid tunnelled drain placement if at all possible. Edema improving.     10/11/21 : CXR reviewed - minimal right effusion. On RA. Will remove pleural drain and plan for close outpt pulmonary follow up.     HPI:    8:34 AM       History was obtained from patient and spouse/SO.      I was asked by WDara Hoyer, MD to see Ariel Day consultation for a chief complaint of pleural effusion and chest tube management.      History of Present Illness:    Presented to ED on 10/07/21 with complaints of significant SOB. She lives with her daughter who is a PhD in the field of Neuroscience. Her daughter advised her to come to ED for further evaluation. Large pleural effusion note upon arrival to ED. Chest tube placed. No history of lung disease.     She has had thoracentesis performed 2 x in the past. First time was about 7 years ago and last time was about 2-3 years ago.     She is feeling much better following chest tube placement. She denies SOB, cough, wheezing. No pain.     Afebrile  Slightly tachycardic  BP stable  Oxygen saturations stable on RA (has not needed supplemental O2 during this admission)  Na 137  K 4.2  Pro-BNP 489  WBC 13.2  1520 mL total chest tube output since placement 10/08/21  Only 20 mL output from chest tube overnight  Pleural fluid appears exudative    Images reviewed:    CXR 10/09/21: report pending    CXR  10/07/21: resolved right pleural effusion with a probable right chest tube in place; tiny  right pneumothorax versus trapped lung; stable small left pleural effusion.    CTA Chest 10/07/21: no evidence of pulmonary embolism; large right pleural effusion, increased in size; moderate left pleural effusion, not significantly changed; bilateral lower lobe atelectasis, right worse than left; diffuse osseous and hepatic metastatic disease.    Past Medical History:   Diagnosis Date    [place]     [place]     EF10%; 65% 2022    Malignant pleural effusion     Orthostatic hypotension        Past Surgical History:   Procedure Laterality Date    CARDIAC DEFIBRILLATOR PLACEMENT Left 2012    CHOLECYSTECTOMY      JOINT REPLACEMENT Bilateral     MASTECTOMY, BILATERAL         Family History   Problem Relation Age of Onset    Heart Disease Mother     Breast Cancer Mother     Heart Disease Father        Social History     Tobacco Use    Smoking status: Never    Smokeless tobacco: Not on  file   Substance Use Topics    Alcohol use: Not Currently       No Known Allergies    Current Facility-Administered Medications   Medication Dose Route Frequency    midodrine (PROAMATINE) tablet 5 mg  5 mg Oral TID WC    spironolactone (ALDACTONE) tablet 12.5 mg  12.5 mg Oral Daily    carvedilol (COREG) tablet 3.125 mg  3.125 mg Oral BID WC    enoxaparin (LOVENOX) injection 40 mg  40 mg SubCUTAneous Daily    oxyCODONE (ROXICODONE) immediate release tablet 5 mg  5 mg Oral Q4H PRN    morphine (PF) injection 1 mg  1 mg IntraVENous Q4H PRN    sodium chloride flush 0.9 % injection 5-40 mL  5-40 mL IntraVENous 2 times per day    sodium chloride flush 0.9 % injection 5-40 mL  5-40 mL IntraVENous PRN    0.9 % sodium chloride infusion   IntraVENous PRN    ondansetron (ZOFRAN-ODT) disintegrating tablet 4 mg  4 mg Oral Q8H PRN    Or    ondansetron (ZOFRAN) injection 4 mg  4 mg IntraVENous Q6H PRN    polyethylene glycol (GLYCOLAX) packet 17  g  17 g Oral Daily PRN    acetaminophen (TYLENOL) tablet 650 mg  650 mg Oral Q6H PRN    Or    acetaminophen (TYLENOL) suppository 650 mg  650 mg Rectal Q6H PRN    predniSONE (DELTASONE) tablet 10 mg  10 mg Oral Daily    balsum peru-castor oil (VENELEX) ointment   Topical BID    artificial tears (dextran-hypromellose-glycerin) (GENTEAL) ophthalmic solution 1 drop  1 drop Both Eyes Q4H PRN         REVIEW OF SYSTEMS   12 point ROS negative except as stated in the HPI.      Physical Exam:   Vitals:    10/11/21 0315   BP: 110/61   Pulse: 86   Resp: 27   Temp: 98 F (36.7 C)   SpO2: 93%       General:  Alert, cooperative, no distress, appears stated age.   Head:  Normocephalic, without obvious abnormality, atraumatic.   Eyes:  Conjunctivae/corneas clear.    Nose: Nares normal. Septum midline. Mucosa normal.    Throat: Lips, mucosa, and tongue normal.    Neck: Supple, symmetrical, trachea midline, no adenopathy.   Lungs:   Clear to auscultation bilaterally; very mildly diminished at bilateral bases   Chest wall:  No tenderness or deformity.   Heart:  Regular rate and rhythm, S1, S2 normal, no murmur, click, rub or gallop.   Abdomen:   Soft, non-tender. Bowel sounds normal. No masses,  No organomegaly.   Extremities: Extremities normal, atraumatic, no cyanosis or edema.   Pulses: 2+ and symmetric all extremities.   Skin: Skin color, texture, turgor normal. No rashes or lesions   Lymph nodes: Cervical, supraclavicular nodes normal.   Neurologic: Grossly nonfocal       Lab Results   Component Value Date/Time    NA 135 10/11/2021 05:15 AM    K 4.3 10/11/2021 05:15 AM    CL 104 10/11/2021 05:15 AM    CO2 28 10/11/2021 05:15 AM    BUN 27 10/11/2021 05:15 AM    MG 2.2 10/08/2021 03:37 AM       Lab Results   Component Value Date/Time    WBC 12.1 10/11/2021 05:15 AM    HGB 11.5 10/11/2021 05:15 AM  PLT 254 10/11/2021 05:15 AM    MCV 102.9 10/11/2021 05:15 AM       Lab Results   Component Value Date/Time    INR 1.1 10/07/2021  03:37 AM    APTT 23.1 10/07/2021 03:37 AM    GLOB 3.8 10/07/2021 03:39 PM       No results found for: IRON, TIBC, IBCT, FERR    No results found for: CRP, ANA, RA, RPR, RPRT, VDRLS, TSH     '@BRIEFLAB24'$ (ph,phi,pco2,pco2i,po2,po2i,hco3,hco3i,fio2,fio2i)@    Lab Results   Component Value Date/Time    BNP 550 03/14/2021 07:23 PM        No components found for: CULT    No results found for: CMV, RPR, HBCM, HBSAG, HAAB, HCAB1, G6PD, HIVR, HIV1, HIV12, HIVPC, HIVRPI    No results found for: CPK    No components found for: COLOR, APPRN, SPGRU, PHU, PROTU, GLUCU, KETU, BILU, BLDU, UROU, NITU, LEUKU, WBCU, RBCU, UEPI, BACTU, CASTS, UCRY    IMPRESSION  SOB  Pleural Effusion s/p chest tube placement 7/15  Stage IV Metastatic Breast Cancer  CHF/Cardiomyopathy s/p ICD    PLAN  Maintain oxygen saturations > 90%; currently on RA  Chest tube in place - will plan to remove today  Currently on oral chemo from Dr. Stephens November in [place], [place]  We discussed considering pleural drain placement, it has been years since last thoracentesis. She will certainly need close monitoring once discharged for recurrence. She would like to defer tunnelled pleural drain placement for now.   F/U cytology pleural fluid    Thank you for allowing [place] to participate in the care of this patient.  We will be happy to follow along in her progress with you.    Winona Legato, APRN - NP

## 2021-10-11 NOTE — Progress Notes (Signed)
1442:   TRANSFER - OUT REPORT:    Verbal report given to Primary RN on Bernadene Bell  being transferred to PCU for routine post-op       Report consisted of patient's Situation, Background, Assessment and   Recommendations(SBAR).     Information from the following report(s) Nurse Handoff Report was reviewed with the receiving nurse.           Lines:   Peripheral IV 10/07/21 Left Antecubital (Active)   Site Assessment Clean, dry & intact 10/11/21 1152   Line Status Normal saline locked;Capped;Flushed 10/11/21 1152   Line Care Cap changed;Connections checked and tightened;Chlorhexidine wipes;Ports disinfected 10/11/21 1152   Phlebitis Assessment No symptoms 10/11/21 1152   Infiltration Assessment 0 10/11/21 1152   Alcohol Cap Used Yes 10/11/21 1152   Dressing Status Clean, dry & intact 10/11/21 1152   Dressing Type Transparent 10/11/21 1152        Opportunity for questions and clarification was provided.      Site is CDI

## 2021-10-12 LAB — BASIC METABOLIC PANEL
Anion Gap: 4 mmol/L — ABNORMAL LOW (ref 5–15)
BUN: 24 MG/DL — ABNORMAL HIGH (ref 6–20)
Bun/Cre Ratio: 31 — ABNORMAL HIGH (ref 12–20)
CO2: 26 mmol/L (ref 21–32)
Calcium: 9.5 MG/DL (ref 8.5–10.1)
Chloride: 105 mmol/L (ref 97–108)
Creatinine: 0.77 MG/DL (ref 0.55–1.02)
Est, Glom Filt Rate: >60 mL/min/{1.73_m2} (ref >60)
Glucose: 93 mg/dL (ref 65–100)
Potassium: 3.7 mmol/L (ref 3.5–5.1)
Sodium: 135 mmol/L — ABNORMAL LOW (ref 136–145)

## 2021-10-12 LAB — CBC WITH AUTO DIFFERENTIAL
Absolute Immature Granulocyte: 0.2 10*3/uL — ABNORMAL HIGH (ref 0.00–0.04)
Basophils %: 0 % (ref 0–1)
Basophils Absolute: 0 10*3/uL (ref 0.0–0.1)
Eosinophils %: 1 % (ref 0–7)
Eosinophils Absolute: 0.1 10*3/uL (ref 0.0–0.4)
Hematocrit: 32.4 % — ABNORMAL LOW (ref 35.0–47.0)
Hemoglobin: 10.6 g/dL — ABNORMAL LOW (ref 11.5–16.0)
Immature Granulocytes: 2 % — ABNORMAL HIGH (ref 0.0–0.5)
Lymphocytes %: 13 % (ref 12–49)
Lymphocytes Absolute: 1.4 10*3/uL (ref 0.8–3.5)
MCH: 33.8 PG (ref 26.0–34.0)
MCHC: 32.7 g/dL (ref 30.0–36.5)
MCV: 103.2 FL — ABNORMAL HIGH (ref 80.0–99.0)
MPV: 10.4 FL (ref 8.9–12.9)
Monocytes %: 11 % (ref 5–13)
Monocytes Absolute: 1.2 10*3/uL — ABNORMAL HIGH (ref 0.0–1.0)
Neutrophils %: 73 % (ref 32–75)
Neutrophils Absolute: 7.9 10*3/uL (ref 1.8–8.0)
Nucleated RBCs: 0.2 PER 100 WBC — ABNORMAL HIGH
Platelets: 243 10*3/uL (ref 150–400)
RBC: 3.14 M/uL — ABNORMAL LOW (ref 3.80–5.20)
RDW: 17.4 % — ABNORMAL HIGH (ref 11.5–14.5)
WBC: 10.9 10*3/uL (ref 3.6–11.0)
nRBC: 0.02 10*3/uL — ABNORMAL HIGH (ref 0.00–0.01)

## 2021-10-12 MED ORDER — SPIRONOLACTONE 25 MG PO TABS
25 MG | ORAL_TABLET | Freq: Every day | ORAL | 3 refills | Status: DC
Start: 2021-10-12 — End: 2021-12-02

## 2021-10-12 MED FILL — ENOXAPARIN SODIUM 40 MG/0.4ML IJ SOSY: 40 MG/0.4ML | INTRAMUSCULAR | Qty: 0.4

## 2021-10-12 MED FILL — MIDODRINE HCL 5 MG PO TABS: 5 MG | ORAL | Qty: 1

## 2021-10-12 MED FILL — SPIRONOLACTONE 25 MG PO TABS: 25 MG | ORAL | Qty: 1

## 2021-10-12 MED FILL — PREDNISONE 5 MG PO TABS: 5 MG | ORAL | Qty: 2

## 2021-10-12 MED FILL — CARVEDILOL 3.125 MG PO TABS: 3.125 MG | ORAL | Qty: 1

## 2021-10-12 NOTE — Progress Notes (Signed)
Discharge to Home    Pt 76 yo female admitted for recurrent pleural effusions. PMHx metastatic breast cancer, CHF, orthostatic hypotension, and malignant pleural effusion.     Pt medically stable for discharge. Telemetry monitoring removed. Discharge instructions, medications, and follow up appointments discussed with pt. Pt verbalized understanding and able to teach back discharge instructions.    Pt transported via wheelchair to ground level main entrance for transportation to home.

## 2021-10-12 NOTE — Progress Notes (Cosign Needed)
Name: Ariel Day: [facility]   DOB: [birth year] Admit Date: 10/07/2021   Phone: [phone]  Room: 2326/01   PCP: No primary care provider on file.  MRN: [id number]   Date: 10/12/2021  Code: Full Code          Chart and notes reviewed. Data reviewed. I review the patient's current medications in the medical record at each encounter.  I have evaluated and examined the patient.     10/10/21 : Sitting up in chair on RA. Husband at bedside. Pleural drain draining serosanguinous fluid. Pleurevac around . We discussed management options. She would like to avoid tunnelled drain placement if at all possible. Edema improving.     10/11/21 : CXR reviewed - minimal right effusion. On RA. Will remove pleural drain and plan for close outpt pulmonary follow up.     10/12/21 : No current complaints, on RA. Pleural drain removed yesterday. Plans for discharge home today.     HPI:    8:54 AM       History was obtained from patient and spouse/SO.      I was asked by Tawny Asal., MD to see Ariel Day in consultation for a chief complaint of pleural effusion and chest tube management.      History of Present Illness:    Presented to ED on 10/07/21 with complaints of significant SOB. She lives with her daughter who is a PhD in the field of Neuroscience. Her daughter advised her to come to ED for further evaluation. Large pleural effusion note upon arrival to ED. Chest tube placed. No history of lung disease.     She has had thoracentesis performed 2 x in the past. First time was about 7 years ago and last time was about 2-3 years ago.     She is feeling much better following chest tube placement. She denies SOB, cough, wheezing. No pain.     Afebrile  Slightly tachycardic  BP stable  Oxygen saturations stable on RA (has not needed supplemental O2 during this admission)  Na 137  K 4.2  Pro-BNP 489  WBC 13.2  1520 mL total chest tube output since placement 10/08/21  Only 20 mL output from chest  tube overnight  Pleural fluid appears exudative    Images reviewed:    CXR 10/09/21: report pending    CXR 10/07/21: resolved right pleural effusion with a probable right chest tube in place; tiny  right pneumothorax versus trapped lung; stable small left pleural effusion.    CTA Chest 10/07/21: no evidence of pulmonary embolism; large right pleural effusion, increased in size; moderate left pleural effusion, not significantly changed; bilateral lower lobe atelectasis, right worse than left; diffuse osseous and hepatic metastatic disease.    Past Medical History:   Diagnosis Date    Breast cancer (HCC)     Cardiomyopathy (HCC)     EF10%; 65% 2022    Malignant pleural effusion     Orthostatic hypotension        Past Surgical History:   Procedure Laterality Date    CARDIAC DEFIBRILLATOR PLACEMENT Left 2012    CHOLECYSTECTOMY      JOINT REPLACEMENT Bilateral     MASTECTOMY, BILATERAL         Family History   Problem Relation Age of Onset    Heart Disease Mother     Breast Cancer Mother     Heart Disease Father        Social  History     Tobacco Use    Smoking status: Never    Smokeless tobacco: Not on file   Substance Use Topics    Alcohol use: Not Currently       No Known Allergies    Current Facility-Administered Medications   Medication Dose Route Frequency    midodrine (PROAMATINE) tablet 5 mg  5 mg Oral TID WC    spironolactone (ALDACTONE) tablet 12.5 mg  12.5 mg Oral Daily    carvedilol (COREG) tablet 3.125 mg  3.125 mg Oral BID WC    enoxaparin (LOVENOX) injection 40 mg  40 mg SubCUTAneous Daily    oxyCODONE (ROXICODONE) immediate release tablet 5 mg  5 mg Oral Q4H PRN    morphine (PF) injection 1 mg  1 mg IntraVENous Q4H PRN    sodium chloride flush 0.9 % injection 5-40 mL  5-40 mL IntraVENous 2 times per day    sodium chloride flush 0.9 % injection 5-40 mL  5-40 mL IntraVENous PRN    0.9 % sodium chloride infusion   IntraVENous PRN    ondansetron (ZOFRAN-ODT) disintegrating tablet 4 mg  4 mg Oral Q8H PRN    Or     ondansetron (ZOFRAN) injection 4 mg  4 mg IntraVENous Q6H PRN    polyethylene glycol (GLYCOLAX) packet 17 g  17 g Oral Daily PRN    acetaminophen (TYLENOL) tablet 650 mg  650 mg Oral Q6H PRN    Or    acetaminophen (TYLENOL) suppository 650 mg  650 mg Rectal Q6H PRN    predniSONE (DELTASONE) tablet 10 mg  10 mg Oral Daily    balsum peru-castor oil (VENELEX) ointment   Topical BID    artificial tears (dextran-hypromellose-glycerin) (GENTEAL) ophthalmic solution 1 drop  1 drop Both Eyes Q4H PRN         REVIEW OF SYSTEMS   12 point ROS negative except as stated in the HPI.      Physical Exam:   Vitals:    10/12/21 0340   BP: (!) 119/57   Pulse: 94   Resp: 16   Temp: 97.3 F (36.3 C)   SpO2: 96%       General:  Alert, cooperative, no distress, appears stated age.   Head:  Normocephalic, without obvious abnormality, atraumatic.   Eyes:  Conjunctivae/corneas clear.    Nose: Nares normal. Septum midline. Mucosa normal.    Throat: Lips, mucosa, and tongue normal.    Neck: Supple, symmetrical, trachea midline, no adenopathy.   Lungs:   Clear to auscultation bilaterally; very mildly diminished at bilateral bases   Chest wall:  No tenderness or deformity.   Heart:  Regular rate and rhythm, S1, S2 normal, no murmur, click, rub or gallop.   Abdomen:   Soft, non-tender. Bowel sounds normal. No masses,  No organomegaly.   Extremities: Extremities normal, atraumatic, no cyanosis or edema.   Pulses: 2+ and symmetric all extremities.   Skin: Skin color, texture, turgor normal. No rashes or lesions   Lymph nodes: Cervical, supraclavicular nodes normal.   Neurologic: Grossly nonfocal       Lab Results   Component Value Date/Time    NA 135 10/12/2021 03:39 AM    K 3.7 10/12/2021 03:39 AM    CL 105 10/12/2021 03:39 AM    CO2 26 10/12/2021 03:39 AM    BUN 24 10/12/2021 03:39 AM    MG 2.2 10/08/2021 03:37 AM       Lab Results  Component Value Date/Time    WBC 10.9 10/12/2021 03:39 AM    HGB 10.6 10/12/2021 03:39 AM    PLT 243 10/12/2021  03:39 AM    MCV 103.2 10/12/2021 03:39 AM       Lab Results   Component Value Date/Time    INR 1.1 10/07/2021 03:37 AM    APTT 23.1 10/07/2021 03:37 AM    GLOB 3.8 10/07/2021 03:39 PM       No results found for: IRON, TIBC, IBCT, FERR    No results found for: CRP, ANA, RA, RPR, RPRT, VDRLS, TSH     @BRIEFLAB24 (ph,phi,pco2,pco2i,po2,po2i,hco3,hco3i,fio2,fio2i)@    Lab Results   Component Value Date/Time    BNP 550 03/14/2021 07:23 PM        No components found for: CULT    No results found for: CMV, RPR, HBCM, HBSAG, HAAB, HCAB1, G6PD, HIVR, HIV1, HIV12, HIVPC, HIVRPI    No results found for: CPK    No components found for: COLOR, APPRN, SPGRU, PHU, PROTU, GLUCU, KETU, BILU, BLDU, UROU, NITU, LEUKU, WBCU, RBCU, UEPI, BACTU, CASTS, UCRY    IMPRESSION  SOB  Pleural Effusion s/p chest tube placement 7/15  Stage IV Metastatic Breast Cancer  CHF/Cardiomyopathy s/p ICD    PLAN  Maintain oxygen saturations > 90%; currently on RA  Will see again as needed and arrange for outpt follow up  Currently on oral chemo from Dr. Sharlyn Bologna in [place], [place]  We discussed considering pleural drain placement, it has been years since last thoracentesis. She will certainly need close monitoring once discharged for recurrence. She would like to defer tunnelled pleural drain placement for now.   F/U cytology pleural fluid    Thank you for allowing [place] to participate in the care of this patient.  We will be happy to follow along in her progress with you.    Rolland Porter, APRN - NP

## 2021-10-12 NOTE — Plan of Care (Signed)
Problem: Discharge Planning  Goal: Discharge to home or other facility with appropriate resources  Outcome: Progressing     Problem: Skin/Tissue Integrity  Goal: Absence of new skin breakdown  Description: 1.  Monitor for areas of redness and/or skin breakdown  2.  Assess vascular access sites hourly  3.  Every 4-6 hours minimum:  Change oxygen saturation probe site  4.  Every 4-6 hours:  If on nasal continuous positive airway pressure, respiratory therapy assess nares and determine need for appliance change or resting period.  Outcome: Progressing     Problem: Safety - Adult  Goal: Free from fall injury  Outcome: Progressing     Problem: ABCDS Injury Assessment  Goal: Absence of physical injury  Outcome: Progressing     Problem: Pain  Goal: Verbalizes/displays adequate comfort level or baseline comfort level  Outcome: Progressing

## 2021-10-12 NOTE — Discharge Summary (Incomplete)
Discharge Summary    Name: Ariel Day  [id number]  Date of Birth: Dec 26, 1945 (Age: 76 y.o.)   Date of Admission: 10/07/2021  Date of Discharge: 10/12/2021  Attending Physician: Kathie Dike, MD    Discharge Diagnosis:     Consultations:  IP CONSULT TO HOSPITALIST  IP CONSULT TO PULMONOLOGY  IP [place]      Brief Admission History/Reason for Admission Per Dara Hoyer., MD:       [facility] Course by Main Problems:       Discharge Exam:  Patient seen and examined by me on discharge day.  Pertinent Findings:  Patient Vitals for the past 24 hrs:   BP Temp Temp src Pulse Resp SpO2   10/12/21 1230 136/69 -- -- 96 -- --   10/12/21 1132 (!) 124/56 98 F (36.7 C) Oral 90 16 --   10/12/21 0915 125/63 98.1 F (36.7 C) Oral 96 20 95 %   10/12/21 0340 (!) 119/57 97.3 F (36.3 C) Oral 94 16 96 %   10/11/21 2345 (!) 140/107 -- -- 88 16 100 %   10/11/21 1930 -- -- -- 90 -- 96 %   10/11/21 1915 (!) 126/91 98.1 F (36.7 C) Oral 90 16 96 %   10/11/21 1500 -- 97.8 F (36.6 C) Oral -- -- --       Gen:    Not in distress  Chest: Clear lungs  CVS:   Regular rhythm.  No edema  Abd:  Soft, not distended, not tender  Neuro: awake, moving all exts    Discharge/Recent Laboratory Results:  Recent Labs     10/12/21  0339   NA 135*   K 3.7   CL 105   CO2 26   BUN 24*   CREATININE 0.77   GLUCOSE 93   CALCIUM 9.5     Recent Labs     10/12/21  0339   HGB 10.6*   HCT 32.4*   WBC 10.9   PLT 243       Discharge Medications:     Medication List        CHANGE how you take these medications      spironolactone 25 MG tablet  Commonly known as: ALDACTONE  Take 0.5 tablets by mouth daily  What changed: how much to take            CONTINUE taking these medications      capecitabine 500 MG chemo tablet  Commonly known as: XELODA     carvedilol 12.5 MG tablet  Commonly known as: COREG     midodrine 2.5 MG tablet  Commonly known as: PROAMATINE     predniSONE 10 MG tablet  Commonly known as:  DELTASONE               Where to Get Your Medications        These medications were sent to [facility] [address] [place] [phone] Wanda Plump [phone]  [address] [place] [address]      Phone: [phone]   spironolactone 25 MG tablet             DISPOSITION:    Home with Family:       Home with HH/PT/OT/RN:    SNF/LTC:    SA[facility]:    OTHER:            Code status:   Recommended diet: {diet:18262}  Recommended  activity: {discharge activity:18261}  Wound care: {WOUND UXNA:35573220}      Follow up with:   PCP : No primary care provider on file.  Margreta Journey, MD  [place]  [address]  [place] [address]  [phone]    Schedule an appointment as soon as possible for a visit in 2 week(s)            Total time in minutes spent coordinating this discharge (includes going over instructions, follow-up, prescriptions, and preparing report for sign off to her PCP) :  35 minutes

## 2021-10-12 NOTE — Discharge Instructions (Addendum)
DISCHARGE DIAGNOSIS:  Recurrent large right malignant pleural effusion and moderate left pleural effusion, POA  Metastatic breast cancer with mets to liver and bone, POA dx 5 years ago  Orthostatic hypotension  Chronic diastolic chf, hx of CHFrEF s/p ICD    MEDICATIONS:  It is important that you take the medication exactly as they are prescribed.   Keep your medication in the bottles provided by the pharmacist and keep a list of the medication names, dosages, and times to be taken in your wallet.   Do not take other medications without consulting your doctor.     Pain Management: per above medications    What to do at Home    Recommended diet:  regular diet    Recommended activity: activity as tolerated    If you have questions regarding the hospital related prescriptions or hospital related issues please call [place] at [phone]. You can always direct your questions to your primary care doctor if you are unable to reach your hospital physician; your PCP works as an extension of your hospital doctor just like your hospital doctor is an extension of your PCP for your time at the hospital [facility]).    If you experience any of the following symptoms then please call your primary care physician or return to the emergency room if you cannot get hold of your doctor:  Fever, chills, nausea, vomiting, diarrhea, change in mentation, falling, bleeding, shortness of breath,

## 2021-10-18 ENCOUNTER — Ambulatory Visit: Payer: MEDICARE | Attending: Medical Oncology | Primary: Family Medicine

## 2021-10-18 NOTE — Telephone Encounter (Signed)
-----   Message from Tamsen Meek sent at 10/18/2021  2:40 PM EDT -----  Regarding: missed hosp f/u new onc  Missed hosp f/u new onc, lm on pt's vm to call us to reschedule.  x1

## 2021-11-20 ENCOUNTER — Emergency Department: Admit: 2021-11-20 | Payer: MEDICARE | Primary: Family Medicine

## 2021-11-20 ENCOUNTER — Inpatient Hospital Stay
Admission: EM | Admit: 2021-11-20 | Discharge: 2021-12-05 | Disposition: A | Discharge status: Hospice | Payer: MEDICARE | Admitting: Internal Medicine

## 2021-11-20 DIAGNOSIS — G9340 Encephalopathy, unspecified: Principal | ICD-10-CM

## 2021-11-20 DIAGNOSIS — C7951 Secondary malignant neoplasm of bone: Secondary | ICD-10-CM

## 2021-11-20 LAB — CBC WITH AUTO DIFFERENTIAL
Absolute Immature Granulocyte: 0.3 10*3/uL — ABNORMAL HIGH (ref 0.00–0.04)
Basophils %: 0 % (ref 0–1)
Basophils Absolute: 0.1 10*3/uL (ref 0.0–0.1)
Eosinophils %: 0 % (ref 0–7)
Eosinophils Absolute: 0.1 10*3/uL (ref 0.0–0.4)
Hematocrit: 40.9 % (ref 35.0–47.0)
Hemoglobin: 12.6 g/dL (ref 11.5–16.0)
Immature Granulocytes: 1 % — ABNORMAL HIGH (ref 0.0–0.5)
Lymphocytes %: 8 % — ABNORMAL LOW (ref 12–49)
Lymphocytes Absolute: 1.5 10*3/uL (ref 0.8–3.5)
MCH: 32.4 PG (ref 26.0–34.0)
MCHC: 30.8 g/dL (ref 30.0–36.5)
MCV: 105.1 FL — ABNORMAL HIGH (ref 80.0–99.0)
MPV: 11.1 FL (ref 8.9–12.9)
Monocytes %: 7 % (ref 5–13)
Monocytes Absolute: 1.4 10*3/uL — ABNORMAL HIGH (ref 0.0–1.0)
Neutrophils %: 84 % — ABNORMAL HIGH (ref 32–75)
Neutrophils Absolute: 16.9 10*3/uL — ABNORMAL HIGH (ref 1.8–8.0)
Nucleated RBCs: 0 PER 100 WBC
Platelets: 260 10*3/uL (ref 150–400)
RBC: 3.89 M/uL (ref 3.80–5.20)
RDW: 18.2 % — ABNORMAL HIGH (ref 11.5–14.5)
WBC: 20.3 10*3/uL — ABNORMAL HIGH (ref 3.6–11.0)
nRBC: 0 10*3/uL (ref 0.00–0.01)

## 2021-11-20 LAB — URINALYSIS WITH REFLEX TO CULTURE
Bilirubin Urine: NEGATIVE
Glucose, UA: NEGATIVE mg/dL
Leukocyte Esterase, Urine: NEGATIVE
Nitrite, Urine: NEGATIVE
Specific Gravity, UA: 1.017 (ref 1.003–1.030)
Urobilinogen, Urine: 1 EU/dL (ref 0.2–1.0)
pH, Urine: 5.5 (ref 5.0–8.0)

## 2021-11-20 LAB — EXTRA TUBES HOLD

## 2021-11-20 LAB — COMPREHENSIVE METABOLIC PANEL
ALT: 53 U/L (ref 12–78)
AST: 112 U/L — ABNORMAL HIGH (ref 15–37)
Albumin/Globulin Ratio: 0.6 — ABNORMAL LOW (ref 1.1–2.2)
Albumin: 2.6 g/dL — ABNORMAL LOW (ref 3.5–5.0)
Alk Phosphatase: 299 U/L — ABNORMAL HIGH (ref 45–117)
Anion Gap: 2 mmol/L — ABNORMAL LOW (ref 5–15)
BUN: 30 MG/DL — ABNORMAL HIGH (ref 6–20)
Bun/Cre Ratio: 23 — ABNORMAL HIGH (ref 12–20)
CO2: 34 mmol/L — ABNORMAL HIGH (ref 21–32)
Calcium: 12.3 MG/DL — ABNORMAL HIGH (ref 8.5–10.1)
Chloride: 95 mmol/L — ABNORMAL LOW (ref 97–108)
Creatinine: 1.31 MG/DL — ABNORMAL HIGH (ref 0.55–1.02)
Est, Glom Filt Rate: 43 mL/min/{1.73_m2} — ABNORMAL LOW (ref >60)
Globulin: 4.2 g/dL — ABNORMAL HIGH (ref 2.0–4.0)
Glucose: 112 mg/dL — ABNORMAL HIGH (ref 65–100)
Potassium: 6.2 mmol/L — ABNORMAL HIGH (ref 3.5–5.1)
Sodium: 131 mmol/L — ABNORMAL LOW (ref 136–145)
Total Bilirubin: 1.3 MG/DL — ABNORMAL HIGH (ref 0.2–1.0)
Total Protein: 6.8 g/dL (ref 6.4–8.2)

## 2021-11-20 LAB — PHOSPHORUS: Phosphorus: 2.4 MG/DL — ABNORMAL LOW (ref 2.6–4.7)

## 2021-11-20 LAB — BASIC METABOLIC PANEL
Anion Gap: 2 mmol/L — ABNORMAL LOW (ref 5–15)
BUN: 29 MG/DL — ABNORMAL HIGH (ref 6–20)
Bun/Cre Ratio: 25 — ABNORMAL HIGH (ref 12–20)
CO2: 34 mmol/L — ABNORMAL HIGH (ref 21–32)
Calcium: 11.8 MG/DL — ABNORMAL HIGH (ref 8.5–10.1)
Chloride: 99 mmol/L (ref 97–108)
Creatinine: 1.17 MG/DL — ABNORMAL HIGH (ref 0.55–1.02)
Est, Glom Filt Rate: 49 mL/min/{1.73_m2} — ABNORMAL LOW (ref >60)
Glucose: 116 mg/dL — ABNORMAL HIGH (ref 65–100)
Potassium: 4 mmol/L (ref 3.5–5.1)
Sodium: 135 mmol/L — ABNORMAL LOW (ref 136–145)

## 2021-11-20 LAB — LACTIC ACID: Lactic Acid, Plasma: 1.7 MMOL/L (ref 0.4–2.0)

## 2021-11-20 LAB — CBC
Hematocrit: 41.5 % (ref 35.0–47.0)
Hemoglobin: 12.8 g/dL (ref 11.5–16.0)
MCH: 32 PG (ref 26.0–34.0)
MCHC: 30.8 g/dL (ref 30.0–36.5)
MCV: 103.8 FL — ABNORMAL HIGH (ref 80.0–99.0)
MPV: 10.5 FL (ref 8.9–12.9)
Nucleated RBCs: 0 PER 100 WBC
Platelets: 242 10*3/uL (ref 150–400)
RBC: 4 M/uL (ref 3.80–5.20)
RDW: 17.8 % — ABNORMAL HIGH (ref 11.5–14.5)
WBC: 25.2 10*3/uL — ABNORMAL HIGH (ref 3.6–11.0)
nRBC: 0 10*3/uL (ref 0.00–0.01)

## 2021-11-20 LAB — TSH: TSH, 3RD GENERATION: 2.63 u[IU]/mL (ref 0.36–3.74)

## 2021-11-20 LAB — MAGNESIUM: Magnesium: 2.1 mg/dL (ref 1.6–2.4)

## 2021-11-20 LAB — BRAIN NATRIURETIC PEPTIDE: NT Pro-BNP: 725 PG/ML — ABNORMAL HIGH (ref <125)

## 2021-11-20 LAB — POTASSIUM: Potassium: 4.1 mmol/L (ref 3.5–5.1)

## 2021-11-20 LAB — TROPONIN: Troponin, High Sensitivity: 18 ng/L (ref 0–51)

## 2021-11-20 LAB — AMMONIA: Ammonia: <10 umol/L (ref <32)

## 2021-11-20 MED ORDER — CEFEPIME HCL 2 G IV SOLR
2 g | Freq: Once | INTRAVENOUS | Status: AC
Start: 2021-11-20 — End: 2021-11-20
  Administered 2021-11-20: 18:00:00 2000 mg via INTRAVENOUS

## 2021-11-20 MED ORDER — NORMAL SALINE FLUSH 0.9 % IV SOLN
0.9 % | INTRAVENOUS | Status: AC | PRN
Start: 2021-11-20 — End: 2021-12-05

## 2021-11-20 MED ORDER — SODIUM CHLORIDE 0.9 % IV BOLUS
0.9 % | Freq: Once | INTRAVENOUS | Status: AC
Start: 2021-11-20 — End: 2021-11-20
  Administered 2021-11-20: 22:00:00 250 mL via INTRAVENOUS

## 2021-11-20 MED ORDER — ACETAMINOPHEN 650 MG RE SUPP
650 | Freq: Four times a day (QID) | RECTAL | Status: DC | PRN
Start: 2021-11-20 — End: 2021-12-05

## 2021-11-20 MED ORDER — CEFEPIME HCL 2 G IV SOLR
2 g | Freq: Once | INTRAVENOUS | Status: AC
Start: 2021-11-20 — End: 2021-11-20
  Administered 2021-11-20: 22:00:00 2000 mg via INTRAVENOUS

## 2021-11-20 MED ORDER — FUROSEMIDE 10 MG/ML IJ SOLN
10 MG/ML | Freq: Once | INTRAMUSCULAR | Status: AC
Start: 2021-11-20 — End: 2021-11-20
  Administered 2021-11-20: 19:00:00 40 mg via INTRAVENOUS

## 2021-11-20 MED ORDER — POLYETHYLENE GLYCOL 3350 17 G PO PACK
17 g | Freq: Every day | ORAL | Status: AC | PRN
Start: 2021-11-20 — End: 2021-12-05

## 2021-11-20 MED ORDER — HEPARIN SOD (PORCINE) IN D5W 100 UNIT/ML IV SOLN
100 UNIT/ML | INTRAVENOUS | Status: AC
Start: 2021-11-20 — End: 2021-11-23
  Administered 2021-11-20: 18 [IU]/kg/h via INTRAVENOUS
  Administered 2021-11-21: 22:00:00 12 [IU]/kg/h via INTRAVENOUS

## 2021-11-20 MED ORDER — ACETAMINOPHEN 325 MG PO TABS
325 | Freq: Four times a day (QID) | ORAL | Status: DC | PRN
Start: 2021-11-20 — End: 2021-12-05
  Administered 2021-11-29 – 2021-12-01 (×2): 650 mg via ORAL

## 2021-11-20 MED ORDER — ONDANSETRON HCL 4 MG/2ML IJ SOLN
4 MG/2ML | Freq: Four times a day (QID) | INTRAMUSCULAR | Status: AC | PRN
Start: 2021-11-20 — End: 2021-12-05
  Administered 2021-11-22 – 2021-12-05 (×9): 4 mg via INTRAVENOUS

## 2021-11-20 MED ORDER — HEPARIN SODIUM (PORCINE) 5000 UNIT/ML IJ SOLN
5000 UNIT/ML | Freq: Three times a day (TID) | INTRAMUSCULAR | Status: DC
Start: 2021-11-20 — End: 2021-11-20
  Administered 2021-11-20: 22:00:00 5000 [IU] via SUBCUTANEOUS

## 2021-11-20 MED ORDER — SODIUM CHLORIDE 0.9 % IV SOLN
0.9 % | INTRAVENOUS | Status: AC | PRN
Start: 2021-11-20 — End: 2021-12-05

## 2021-11-20 MED ORDER — MIDODRINE HCL 5 MG PO TABS
5 MG | Freq: Two times a day (BID) | ORAL | Status: AC
Start: 2021-11-20 — End: 2021-12-05
  Administered 2021-11-20 – 2021-12-05 (×25): 2.5 mg via ORAL

## 2021-11-20 MED ORDER — PREDNISONE 10 MG PO TABS
10 MG | Freq: Every day | ORAL | Status: AC
Start: 2021-11-20 — End: 2021-12-05
  Administered 2021-11-20 – 2021-12-05 (×15): 10 mg via ORAL

## 2021-11-20 MED ORDER — HEPARIN SODIUM (PORCINE) 1000 UNIT/ML IJ SOLN
1000 UNIT/ML | INTRAMUSCULAR | Status: AC | PRN
Start: 2021-11-20 — End: 2021-11-23

## 2021-11-20 MED ORDER — IOPAMIDOL 76 % IV SOLN
76 % | Freq: Once | INTRAVENOUS | Status: AC | PRN
Start: 2021-11-20 — End: 2021-11-20
  Administered 2021-11-20: 21:00:00 100 mL via INTRAVENOUS

## 2021-11-20 MED ORDER — CALCITONIN (SALMON) 200 UNIT/ML IJ SOLN
200 UNIT/ML | Freq: Two times a day (BID) | INTRAMUSCULAR | Status: AC
Start: 2021-11-20 — End: 2021-11-21
  Administered 2021-11-20 – 2021-11-21 (×2): 286 [IU]/kg via INTRAMUSCULAR

## 2021-11-20 MED ORDER — HEPARIN SODIUM (PORCINE) 1000 UNIT/ML IJ SOLN
1000 UNIT/ML | Freq: Once | INTRAMUSCULAR | Status: AC
Start: 2021-11-20 — End: 2021-11-20
  Administered 2021-11-20: 5700 [IU]/kg via INTRAVENOUS

## 2021-11-20 MED ORDER — NORMAL SALINE FLUSH 0.9 % IV SOLN
0.9 % | Freq: Two times a day (BID) | INTRAVENOUS | Status: AC
Start: 2021-11-20 — End: 2021-12-05
  Administered 2021-11-21 – 2021-12-04 (×22): 10 mL via INTRAVENOUS
  Administered 2021-12-04: 13:00:00 5 mL via INTRAVENOUS
  Administered 2021-12-05: 13:00:00 10 mL via INTRAVENOUS

## 2021-11-20 MED ORDER — FUROSEMIDE 10 MG/ML IJ SOLN
10 MG/ML | Freq: Two times a day (BID) | INTRAMUSCULAR | Status: AC
Start: 2021-11-20 — End: 2021-11-21
  Administered 2021-11-20 – 2021-11-21 (×2): 40 mg via INTRAVENOUS

## 2021-11-20 MED ORDER — ONDANSETRON 4 MG PO TBDP
4 MG | Freq: Three times a day (TID) | ORAL | Status: AC | PRN
Start: 2021-11-20 — End: 2021-12-05

## 2021-11-20 MED ORDER — HEPARIN SODIUM (PORCINE) 1000 UNIT/ML IJ SOLN
1000 UNIT/ML | INTRAMUSCULAR | Status: DC | PRN
Start: 2021-11-20 — End: 2021-11-23

## 2021-11-20 MED ORDER — SODIUM CHLORIDE 0.9 % IV SOLN (MINI-BAG)
0.9 % | INTRAVENOUS | Status: AC
Start: 2021-11-20 — End: 2021-11-24
  Administered 2021-11-22 – 2021-11-23 (×2): 2000 mg via INTRAVENOUS

## 2021-11-20 MED FILL — SODIUM CHLORIDE 0.9 % IV SOLN: 0.9 % | INTRAVENOUS | Qty: 250

## 2021-11-20 MED FILL — ISOVUE-370 76 % IV SOLN: 76 % | INTRAVENOUS | Qty: 100

## 2021-11-20 MED FILL — MIDODRINE HCL 5 MG PO TABS: 5 MG | ORAL | Qty: 1

## 2021-11-20 MED FILL — FUROSEMIDE 10 MG/ML IJ SOLN: 10 MG/ML | INTRAMUSCULAR | Qty: 4

## 2021-11-20 MED FILL — PREDNISONE 10 MG PO TABS: 10 MG | ORAL | Qty: 1

## 2021-11-20 MED FILL — MONOJECT FLUSH SYRINGE 0.9 % IV SOLN: 0.9 % | INTRAVENOUS | Qty: 40

## 2021-11-20 MED FILL — HEPARIN SODIUM (PORCINE) 5000 UNIT/ML IJ SOLN: 5000 UNIT/ML | INTRAMUSCULAR | Qty: 1

## 2021-11-20 MED FILL — CEFEPIME HCL 2 G IV SOLR: 2 g | INTRAVENOUS | Qty: 2

## 2021-11-20 MED FILL — HEPARIN SODIUM (PORCINE) 1000 UNIT/ML IJ SOLN: 1000 UNIT/ML | INTRAMUSCULAR | Qty: 10

## 2021-11-20 MED FILL — HEPARIN SOD (PORCINE) IN D5W 100 UNIT/ML IV SOLN: 100 UNIT/ML | INTRAVENOUS | Qty: 250

## 2021-11-20 MED FILL — MIACALCIN 200 UNIT/ML IJ SOLN: 200 UNIT/ML | INTRAMUSCULAR | Qty: 1.43

## 2021-11-20 NOTE — ED Triage Notes (Signed)
Patient arrives by EMS from home with weakness for 5 days. Family reports AMS, A&O4 with EMS.     Hx of metastatic breast cancer and CHF.

## 2021-11-20 NOTE — ED Provider Notes (Signed)
[facility] EMERGENCY DEP  EMERGENCY DEPARTMENT ENCOUNTER      Pt Name: Ariel Day  MRN: [id number]  [place] 05-07-1945  Date of evaluation: 11/20/2021  Provider: Bevelyn Ngo, MD    [place]       Chief Complaint   Patient presents with    Extremity Weakness         HISTORY OF PRESENT ILLNESS   (Location/Symptom, Timing/Onset, Context/Setting, Quality, Duration, Modifying Factors, Severity)  Note limiting factors.   76F w/ hx breast cancer w/ bone and liver mets and cardiomyopathy w/ LVEF 10% and s/p PPM/ICD p/w 3d generalized weakness. Pt reports 3d of worsening whole body weakness and fatigue. States that previously was able to walk a few weeks ago but now too tired to walk. Denies any pain anywhere including no head/neck/back or chest/abd pain. No F/C, cough, dyspnea, diarrhea. Has had vomiting and significantly decreased PO intake.     Pt coming from home where lives w/ husband. Pt is a very poor historian. Per husband has been significantly more confused and disoriented over past 1wk. Follows w/ oncology in [place] but recently moved here. Family concerned about electrolyte abnl.    The history is limited by the condition of the patient.       Review of External Medical Records:     Nursing Notes were reviewed.    REVIEW OF SYSTEMS    (2-9 systems for level 4, 10 or more for level 5)     Review of Systems   Unable to perform ROS: Mental status change     Except as noted above the remainder of the review of systems was reviewed and negative.       PAST MEDICAL HISTORY     Past Medical History:   Diagnosis Date    Breast cancer ([place])     Cardiomyopathy ([place])     EF10%; 65% 2022    Malignant pleural effusion     Orthostatic hypotension          SURGICAL HISTORY       Past Surgical History:   Procedure Laterality Date    CARDIAC DEFIBRILLATOR PLACEMENT Left 2012    CHOLECYSTECTOMY      JOINT REPLACEMENT Bilateral     MASTECTOMY, BILATERAL           CURRENT MEDICATIONS       Previous Medications     CAPECITABINE (XELODA) 500 MG CHEMO TABLET    Take 2 tablets by mouth 2 times daily    CARVEDILOL (COREG) 12.5 MG TABLET    Take 0.5 tablets by mouth 2 times daily (with meals)    MIDODRINE (PROAMATINE) 2.5 MG TABLET    Take 1 tablet by mouth 2 times daily    PREDNISONE (DELTASONE) 10 MG TABLET    Take 1 tablet by mouth daily    SPIRONOLACTONE (ALDACTONE) 25 MG TABLET    Take 0.5 tablets by mouth daily       ALLERGIES     Patient has no known allergies.    FAMILY HISTORY       Family History   Problem Relation Age of Onset    Heart Disease Mother     Breast Cancer Mother     Heart Disease Father           SOCIAL HISTORY       Social History     Socioeconomic History    Marital status: Married   Tobacco Use    Smoking status:  Never   Substance and Sexual Activity    Alcohol use: Not Currently    Drug use: Never           PHYSICAL EXAM    (up to 7 for level 4, 8 or more for level 5)     ED Triage Vitals   BP Temp Temp Source Pulse Respirations SpO2 Height Weight - Scale   11/20/21 1158 11/20/21 1158 11/20/21 1158 11/20/21 1158 11/20/21 1158 11/20/21 1158 11/20/21 1200 11/20/21 1200   118/71 97.8 F (36.6 C) Oral 93 24 96 % '5\' 5"'  (1.651 m) 157 lb 3 oz (71.3 kg)       Body mass index is 26.16 kg/m.    Physical Exam  Constitutional:       General: She is not in acute distress.     Appearance: Normal appearance. She is not ill-appearing, toxic-appearing or diaphoretic.   HENT:      Head: Normocephalic.      Mouth/Throat:      Mouth: Mucous membranes are moist.      Pharynx: Oropharynx is clear. No oropharyngeal exudate or posterior oropharyngeal erythema.   Eyes:      General: No scleral icterus.     Extraocular Movements: Extraocular movements intact.      Conjunctiva/sclera: Conjunctivae normal.      Pupils: Pupils are equal, round, and reactive to light.   Cardiovascular:      Rate and Rhythm: Normal rate and regular rhythm.      Pulses: Normal pulses.      Heart sounds: No murmur heard.    No friction rub.    Pulmonary:      Effort: Pulmonary effort is normal. No respiratory distress.      Breath sounds: Normal breath sounds. No stridor. No wheezing, rhonchi or rales.   Abdominal:      General: Abdomen is flat. There is no distension.      Palpations: Abdomen is soft.      Tenderness: There is no abdominal tenderness. There is no guarding or rebound.   Musculoskeletal:         General: No swelling.      Cervical back: Normal range of motion. No rigidity.      Right lower leg: Edema present.      Left lower leg: Edema present.   Skin:     General: Skin is warm and dry.      Capillary Refill: Capillary refill takes less than 2 seconds.      Coloration: Skin is not jaundiced.   Neurological:      General: No focal deficit present.      Mental Status: She is alert.      Cranial Nerves: No cranial nerve deficit.      Sensory: No sensory deficit.      Motor: No weakness.      Coordination: Coordination normal.      Comments: Normal pelvic sensation  BLE 4/5 strength equally  Normal BLE patellar refexes       DIAGNOSTIC RESULTS     EKG: All EKG's are interpreted by the Emergency Department Physician who either signs or Co-signs this chart in the absence of a cardiologist.    EKG Interpretation  V-paced rhythm, no ST changes, +anterior qwaves    RADIOLOGY:   Interpretation per the Radiologist below, if available at the time of this note:    CT ABDOMEN PELVIS W IV CONTRAST Additional Contrast? None   Final Result  1.  Large filling defect extending from the left femoral vein to the inferior   portion of the inferior vena cava concerning for deep venous thrombosis.      2.  Multiple hepatic masses are again noted compatible with hepatic metastatic   disease.      3.  Diffuse skeletal metastases are again visualized.      4.  Small volume of ascites.         The findings were conveyed to [facility]  via perfectserve at 11/20/2021 5:54 PM   by Denna Haggard.  789       CT HEAD WO CONTRAST   Final Result   No acute intracranial  abnormality.          XR CHEST PORTABLE   Final Result      Pulmonary edema with bilateral pleural effusions   Lytic corticated changes and both humeri and scapula. Consider bone survey to   further evaluate.              LABS:  Admission on 11/20/2021   Component Date Value Ref Range Status    Ventricular Rate 11/20/2021 90  BPM Preliminary    Atrial Rate 11/20/2021 90  BPM Preliminary    P-R Interval 11/20/2021 106  ms Preliminary    QRS Duration 11/20/2021 118  ms Preliminary    Q-T Interval 11/20/2021 398  ms Preliminary    QTc Calculation ([place]azett) 11/20/2021 486  ms Preliminary    P Axis 11/20/2021 37  degrees Preliminary    R Axis 11/20/2021 150  degrees Preliminary    T Axis 11/20/2021 32  degrees Preliminary    Diagnosis 11/20/2021    Preliminary                    Value:Atrial-sensed ventricular-paced rhythm  Biventricular pacemaker detected  Abnormal ECG  When compared with ECG of 07-Oct-2021 15:33,  Vent. rate has decreased BY  22 BPM      WBC 11/20/2021 20.3 (H)  3.6 - 11.0 K/uL Final    RBC 11/20/2021 3.89  3.80 - 5.20 M/uL Final    Hemoglobin 11/20/2021 12.6  11.5 - 16.0 g/dL Final    Hematocrit 11/20/2021 40.9  35.0 - 47.0 % Final    MCV 11/20/2021 105.1 (H)  80.0 - 99.0 FL Final    MCH 11/20/2021 32.4  26.0 - 34.0 PG Final    MCHC 11/20/2021 30.8  30.0 - 36.5 g/dL Final    RDW 11/20/2021 18.2 (H)  11.5 - 14.5 % Final    Platelets 11/20/2021 260  150 - 400 K/uL Final    MPV 11/20/2021 11.1  8.9 - 12.9 FL Final    Nucleated RBCs 11/20/2021 0.0  0 PER 100 WBC Final    nRBC 11/20/2021 0.00  0.00 - 0.01 K/uL Final    Neutrophils % 11/20/2021 84 (H)  32 - 75 % Final    Lymphocytes % 11/20/2021 8 (L)  12 - 49 % Final    Monocytes % 11/20/2021 7  5 - 13 % Final    Eosinophils % 11/20/2021 0  0 - 7 % Final    Basophils % 11/20/2021 0  0 - 1 % Final    Immature Granulocytes 11/20/2021 1 (H)  0.0 - 0.5 % Final    Neutrophils Absolute 11/20/2021 16.9 (H)  1.8 - 8.0 K/UL Final    Lymphocytes Absolute 11/20/2021  1.5  0.8 - 3.5 K/UL Final  Monocytes Absolute 11/20/2021 1.4 (H)  0.0 - 1.0 K/UL Final    Eosinophils Absolute 11/20/2021 0.1  0.0 - 0.4 K/UL Final    Basophils Absolute 11/20/2021 0.1  0.0 - 0.1 K/UL Final    Absolute Immature Granulocyte 11/20/2021 0.3 (H)  0.00 - 0.04 K/UL Final    Differential Type 11/20/2021 AUTOMATED    Final    Sodium 11/20/2021 131 (L)  136 - 145 mmol/L Final    Potassium 11/20/2021 6.2 (H)  3.5 - 5.1 mmol/L Final    SPECIMEN HEMOLYZED, RESULTS MAY BE AFFECTED    Chloride 11/20/2021 95 (L)  97 - 108 mmol/L Final    CO2 11/20/2021 34 (H)  21 - 32 mmol/L Final    Anion Gap 11/20/2021 2 (L)  5 - 15 mmol/L Final    Glucose 11/20/2021 112 (H)  65 - 100 mg/dL Final    BUN 11/20/2021 30 (H)  6 - 20 MG/DL Final    Creatinine 11/20/2021 1.31 (H)  0.55 - 1.02 MG/DL Final    Bun/Cre Ratio 11/20/2021 23 (H)  12 - 20   Final    Est, Glom Filt Rate 11/20/2021 43 (L)  >60 ml/min/1.4m Final    Comment:    Pediatric calculator link: https://www.kidney.org/professionals/kdoqi/gfr_calculatorped     These results are not intended for use in patients <168years of age.     eGFR results are calculated without a race factor using  the 2021 CKD-EPI equation. Careful clinical correlation is recommended, particularly when comparing to results calculated using previous equations.  The CKD-EPI equation is less accurate in patients with extremes of muscle mass, extra-renal metabolism of creatinine, excessive creatine ingestion, or following therapy that affects renal tubular secretion.      Calcium 11/20/2021 12.3 (H)  8.5 - 10.1 MG/DL Final    Total Bilirubin 11/20/2021 1.3 (H)  0.2 - 1.0 MG/DL Final    ALT 11/20/2021 53  12 - 78 U/L Final    AST 11/20/2021 112 (H)  15 - 37 U/L Final    SPECIMEN HEMOLYZED, RESULTS MAY BE AFFECTED    Alk Phosphatase 11/20/2021 299 (H)  45 - 117 U/L Final    Total Protein 11/20/2021 6.8  6.4 - 8.2 g/dL Final    Albumin 11/20/2021 2.6 (L)  3.5 - 5.0 g/dL Final    Globulin 11/20/2021  4.2 (H)  2.0 - 4.0 g/dL Final    Albumin/Globulin Ratio 11/20/2021 0.6 (L)  1.1 - 2.2   Final    Specimen HOld 11/20/2021 1BLU,BC(2YELLOW)   Final    Comment: 11/20/2021 Add-on orders for these samples will be processed based on acceptable specimen integrity and analyte stability, which may vary by analyte.    Final    Troponin, High Sensitivity 11/20/2021 18  0 - 51 ng/L Final    Comment: SPECIMEN HEMOLYZED, RESULTS MAY BE AFFECTED  A HS troponin value change of (+ or -) 50% or more below the 99th percentile, in a 1/2/3 hr interval represents a significant change. Clinical correlation is recommended.  A HS troponin value change of (+ or -) 20% or above the 99th percentile, in a 1/2/3 hr interval represents a significant change. Clinical correlation is recommended.  99th Percentile:   Women: 0-51 ng/L  Men:   0-76 ng/L  Patients taking more than 20 mg/day of biotin may have falsely negative results and should not use this test.      Color, UA 11/20/2021 DARK YELLOW    Final    Color Reference Range: Straw, Yellow or Dark Yellow    Appearance 11/20/2021 CLOUDY (A)  CLEAR   Final    Specific Gravity, UA 11/20/2021 1.017  1.003 - 1.030   Final    pH, Urine 11/20/2021 5.5  5.0 - 8.0   Final    Protein, UA 11/20/2021 TRACE (A)  NEG mg/dL Final    Glucose, UA 11/20/2021 Negative  NEG mg/dL Final    Ketones, Urine 11/20/2021 TRACE (A)  NEG mg/dL Final    Bilirubin Urine 11/20/2021 Negative  NEG   Final    Blood, Urine 11/20/2021 SMALL (A)  NEG   Final    Urobilinogen, Urine 11/20/2021 1.0  0.2 - 1.0 EU/dL Final    Nitrite, Urine 11/20/2021 Negative  NEG   Final    Leukocyte Esterase, Urine 11/20/2021 Negative  NEG   Final    WBC, UA 11/20/2021 0-4  0 - 4 /hpf Final    RBC, UA 11/20/2021 5-10  0 - 5 /hpf Final    Epithelial Cells UA 11/20/2021 FEW  FEW /lpf Final    Epithelial cell category consists of squamous cells and /or transitional urothelial cells. Renal  tubular cells, if present, are separately identified as such.    BACTERIA, URINE 11/20/2021 1+ (A)  NEG /hpf Final    Urine Culture if Indicated 11/20/2021 CULTURE NOT INDICATED BY UA RESULT  CNI   Final    Mucus, UA 11/20/2021 2+ (A)  NEG /lpf Final    TSH, 3RD GENERATION 11/20/2021 2.63  0.36 - 3.74 uIU/mL Final    Comment:    Due to TSH heterogeneity, both structurally and degree of glycosylation, monoclonal antibodies used in the TSH assay may not accurately quantitate TSH. Therefore, this result should be correlated with clinical findings as well as with other assessments of thyroid function, e.g., free T4, free T3.      Magnesium 11/20/2021 2.1  1.6 - 2.4 mg/dL Final    SPECIMEN HEMOLYZED, RESULTS MAY BE AFFECTED    NT Pro-BNP 11/20/2021 725 (H)  <125 PG/ML Final    Comment:    NT-proBNP is highly sensitive for the detection of acute congestive heart failure in dyspneic patients. A NT-proBNP result <300 pg/mL has a negative predictive value of 98% for acute heart failure.  Marked elevations in NT-proBNP levels may be observed in patients with left ventricular congestive failure, atrial fibrillation without clear heart failure, acute coronary syndromes, right heart strain/failure (including pulmonary embolism and cor pulmonale), critical illness, renal failure or advanced age. Falsely low NT-proBNP may be observed in obese CHF patients.     Recent research  suggests the following cutoff values are appropriate based on patient age and clinical setting, reduce false positive (FP) results, and improve positive predictive values for acute heart failure:  Acutely dyspneic patient: age <50, use cutoff of 450 pg/mL  Acutely dyspneic patient: age 55-75, use cutoff of 900 pg/mL  Acutely dyspneic patient: age >51,                            use cutoff of 1800 pg/mL     FDA approved cutpoints for ruling-out heart failure in the office:  Ambulatory patient: age <40,  use cutoff of 125 pg/mL  Ambulatory patient: age >80, use  cutoff of 450 pg/mL      Sodium 11/20/2021 135 (L)  136 - 145 mmol/L Final    Potassium 11/20/2021 4.0  3.5 - 5.1 mmol/L Final    INVESTIGATED PER DELTA CHECK PROTOCOL    Chloride 11/20/2021 99  97 - 108 mmol/L Final    CO2 11/20/2021 34 (H)  21 - 32 mmol/L Final    Anion Gap 11/20/2021 2 (L)  5 - 15 mmol/L Final    Glucose 11/20/2021 116 (H)  65 - 100 mg/dL Final    BUN 11/20/2021 29 (H)  6 - 20 MG/DL Final    Creatinine 11/20/2021 1.17 (H)  0.55 - 1.02 MG/DL Final    Bun/Cre Ratio 11/20/2021 25 (H)  12 - 20   Final    Est, Glom Filt Rate 11/20/2021 49 (L)  >60 ml/min/1.55m Final    Comment:    Pediatric calculator link: https://www.kidney.org/professionals/kdoqi/gfr_calculatorped     These results are not intended for use in patients <187years of age.     eGFR results are calculated without a race factor using  the 2021 CKD-EPI equation. Careful clinical correlation is recommended, particularly when comparing to results calculated using previous equations.  The CKD-EPI equation is less accurate in patients with extremes of muscle mass, extra-renal metabolism of creatinine, excessive creatine ingestion, or following therapy that affects renal tubular secretion.      Calcium 11/20/2021 11.8 (H)  8.5 - 10.1 MG/DL Final    Lactic Acid, Plasma 11/20/2021 1.7  0.4 - 2.0 MMOL/L Final    Special Requests 11/20/2021 NO SPECIAL REQUESTS    Preliminary    Culture 11/20/2021 NO GROWTH <24 HRS    Preliminary    Special Requests 11/20/2021 NO SPECIAL REQUESTS    Preliminary    Culture 11/20/2021 NO GROWTH <24 HRS    Preliminary    Ammonia 11/20/2021 <10  <32 UMOL/L Final    Ammonia determinations are subject to marked lability.  Upon standing, ammonia levels increase rapidly due to red cell metabolism. Concentrations may more than double in plasma if sample is stored at room temperature for 6 hours.    Phosphorus 11/20/2021 2.4 (L)  2.6 - 4.7 MG/DL Final    Potassium 11/20/2021 4.1  3.5 - 5.1 mmol/L Final    WBC 11/20/2021  25.2 (H)  3.6 - 11.0 K/uL Final    RBC 11/20/2021 4.00  3.80 - 5.20 M/uL Final    Hemoglobin 11/20/2021 12.8  11.5 - 16.0 g/dL Final    Hematocrit 11/20/2021 41.5  35.0 - 47.0 % Final    MCV 11/20/2021 103.8 (H)  80.0 - 99.0 FL Final    MCH 11/20/2021 32.0  26.0 - 34.0 PG Final    MCHC 11/20/2021 30.8  30.0 - 36.5 g/dL Final    RDW 11/20/2021 17.8 (H)  11.5 - 14.5 % Final    Platelets 11/20/2021 242  150 - 400 K/uL Final    MPV 11/20/2021 10.5  8.9 - 12.9 FL Final    Nucleated RBCs 11/20/2021 0.0  0 PER 100 WBC Final    nRBC 11/20/2021 0.00  0.00 - 0.01 K/uL Final         EMERGENCY DEPARTMENT COURSE and DIFFERENTIAL DIAGNOSIS/MDM:   Vitals:    Vitals:    11/20/21 1800 11/20/21 1830 11/20/21 1915 11/20/21 1930   BP: (!) 118/94 126/79 120/76 127/82   Pulse: (!) 105 (!) 102 (!) 109 (Marland Kitchen  107   Resp: '23 28 21 20   ' Temp:       TempSrc:       SpO2:  97%     Weight:       Height:               Medical Decision Making  74F w/ hx metastatic breast cancer and 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 Afebrile and hemodynamically stable. No hypoxia but tachypneic. No focal neuro deficits. Ddx for AMS includes electrolyte/metabolic vs toxidrome/polypharmacy/substance abuse/withdrawal vs endocrine (thyroid storm, myxedema coma) vs infectious (UTI, PNA, less likely meningitis/encephalitis) vs CNS (ICH, CVA, SDH/epidural, HTN emergency, PRES) vs seizure or post-ictal state vs serotonin syndrome/NMS vs neurodegenerative disease or dementia. Ordered CTH, CXR, EKG, labs. Monitor and reassess.    1400 Pt remains stable. CTH neg for acute findings. CXR showing pulm edema w/ b/l pleural effusions (and known bony mets). EKG paced rhythm. Labs show AKI (1.3 from previous 0.77) w/ hypercalcemia. Has a leukocytosis w/ left shift but normal lactate. UA equivocal. Covered w/ cefepime and sent cx. Also gave lasix. Admitting for infectious/metabolic encephalopathy.    1500 Hospitalist asked that we order a CTAP  from ED and they agree to f/u on the results of that study.      Pt reports 3d of worsening whole body weakness and fatigue. States that previously was able to walk a few weeks ago but now too tired to walk. Denies any pain anywhere including no head/neck/back or chest/abd pain. No F/C, cough, dyspnea, diarrhea. Has had vomiting and PO intake.     Pt coming from home where lives w/ daughter. Pt is a very poor historian. Unclear what her baseline MS is like.      Amount and/or Complexity of Data Reviewed  Labs: ordered. Decision-making details documented in ED Course.  Radiology: ordered and independent interpretation performed. Decision-making details documented in ED Course.  ECG/medicine tests: ordered and independent interpretation performed. Decision-making details documented in ED Course.    Risk  Prescription drug management.  Decision regarding hospitalization.            ED Course            CONSULTS:  None    PROCEDURES:  Unless otherwise noted below, none     Procedures  Total critical care time (not including time spent performing separately reportable procedures): 45      FINAL IMPRESSION      1. Acute encephalopathy    2. Hypercalcemia    3. Complicated UTI (urinary tract infection)    4. Carcinoma of breast metastatic to bone, unspecified laterality ([place])    5. Abnormal LFTs          DISPOSITION/PLAN   DISPOSITION Admitted 11/20/2021 04:52:01 PM      PATIENT REFERRED TO:  No follow-up provider specified.    DISCHARGE MEDICATIONS:  New Prescriptions    No medications on file         Bevelyn Ngo, MD (electronically signed)  Emergency Attending Physician       [employer] for Admission  7:57 PM    ED Room Number: ER11/11  Patient Name and age:  Ariel Day 76 y.o.  female  Working Diagnosis:   1. Acute encephalopathy    2. Hypercalcemia    3. Complicated UTI (urinary tract infection)    4. Carcinoma of breast metastatic to bone, unspecified laterality ([place])    5. Abnormal LFTs  Department: [facility] Adult ED - [phone]  Recommended Level of Care: telemetry  Readmission: No  Code Status:  Full Code  Sepsis present:  No  Reassessment needed: Yes  Isolation Requirements: no  COVID-19 Suspicion: No    Other:         Bevelyn Ngo, MD  11/20/21 [phone]

## 2021-11-20 NOTE — ED Notes (Signed)
Bedside and Verbal shift change report given to Jerolyn Shin, Therapist, sports (Soil scientist) by Audelia Acton, RN (offgoing nurse). Report included the following information Nurse Handoff Report, ED Encounter Summary, ED SBAR, Adult Overview, Intake/Output, MAR, and Recent Results.       Michell Heinrich, RN  11/20/21 1949

## 2021-11-20 NOTE — ED Notes (Signed)
Called Husband and let him know that PT is being transferred to room 572.     Brunilda Payor  11/20/21 2137

## 2021-11-20 NOTE — ED Notes (Signed)
Bedside report rcvd. Pt resting on stretcher; nad noted. Pt on monitor x3; vss and documented. Pt  has heparin infusing at this time w/o difficulty. Pt awaiting a clean bed assignment for admission.       Dierdre Forth, RN  11/20/21 2008

## 2021-11-20 NOTE — ED Notes (Signed)
Pt transfer confirmed with Mavis,RN on 5S.      Dierdre Forth, RN  11/20/21 2139

## 2021-11-20 NOTE — Progress Notes (Addendum)
Pt has dr.Bleck's order for bedside telemetry. Nurse called nursing supervisor to clarify the order. Nursing supervisor stated transferring pt to room 204.

## 2021-11-20 NOTE — ED Notes (Signed)
Attempted to give report to 5S; no answer at this time      Dierdre Forth, RN  11/20/21 2129

## 2021-11-20 NOTE — H&P (Addendum)
History & Physical    Primary Care Provider: No primary care provider on file.  Source of Information: Patient and chart review    History of Presenting Illness:   Ariel Day is a 76 y.o. 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  Patient is seen and examined at bedside.  She is conversant and provides brief history.  She states she is aware that her family brought her to emergency room due to confusion.  She states she has however felt fine.  She does endorse some mild midepigastric tenderness and admits to feeling weak and tired.  Her husband at bedside states over the last few days, she has had decreased p.o. intake increasing malaise and difficulty walking and over the last 24 hours she has been altered with an incoherent speech.  She has continued to have lucid intervals in between this.    The patient denies any fever, chills, chest pain, nausea, vomiting, cough, congestion, recent illness, palpitations, or dysuria, urinary urgency or frequency.    Remarkable vitals on ER Presentation: vss  Labs Remarkable for: bnp 725, wbc 20.3, na 131, k-6.2, cr 1.31, ca 13.5, ast 112, alk phos 299, alb 2.6, , UA: trace ket, small bld, 1+ bact  ER Images: cxr: pulm edema with bilateral pleural effusions. Lytic lesions in hemeri and scapulae. Ct head: no acute process.  ER Rx: cefepime, lasix 41m iv     Review of Systems:  Pertinent items are noted in the History of Present Illness.     Past Medical History:   Diagnosis Date    Breast cancer ([facility]     Cardiomyopathy ([place]     EF10%; 65% 2022    Malignant pleural effusion     Orthostatic hypotension       Past Surgical History:   Procedure Laterality Date    CARDIAC DEFIBRILLATOR PLACEMENT Left 2012    CHOLECYSTECTOMY      JOINT REPLACEMENT Bilateral      MASTECTOMY, BILATERAL       Prior to Admission medications    Medication Sig Start Date End Date Taking? Authorizing Provider   spironolactone (ALDACTONE) 25 MG tablet Take 0.5 tablets by mouth daily 10/12/21   OKathie Dike MD   predniSONE (DELTASONE) 10 MG tablet Take 1 tablet by mouth daily    Historical Provider, MD   midodrine (PROAMATINE) 2.5 MG tablet Take 1 tablet by mouth 2 times daily    Historical Provider, MD   carvedilol (COREG) 12.5 MG tablet Take 0.5 tablets by mouth 2 times daily (with meals)    Historical Provider, MD   capecitabine (XELODA) 500 MG chemo tablet Take 2 tablets by mouth 2 times daily    Historical Provider, MD     No Known Allergies   Family History   Problem Relation Age of Onset    Heart Disease Mother     Breast Cancer Mother     Heart Disease Father         SOCIAL HISTORY:  Patient resides:  Independently x   Assisted Living    SNF    With family care       Smoking history:   None x   Former    Chronic      Alcohol history:   None x  Social    Chronic      Ambulates:   Independently x   w/cane    w/walker    w/wc    CODE STATUS:  DNR    Full x   Other      Objective:     Physical Exam:     BP 122/67   Pulse 99   Temp 97.8 F (36.6 C) (Oral)   Resp 28   Ht 1.651 m ('5\' 5"' )   Wt 71.3 kg (157 lb 3 oz)   SpO2 90%   BMI 26.16 kg/m         General:  Alert, cooperative, no distress, appears stated age.   Head:  Normocephalic, without obvious abnormality, atraumatic.   Eyes:  Conjunctivae/corneas clear. PERRL, EOMs intact.   Nose: Nares normal. Septum midline. Mucosa normal.        Neck: Supple, symmetrical, trachea midline.       Lungs:   Clear to auscultation bilaterally.   Chest wall:  No tenderness or deformity.   Heart:  Regular rate and rhythm, S1, S2 normal   Abdomen:   Soft, mild midepigastric tenderness to palpation. Bowel sounds normal. No masses,  No organomegaly.   Extremities: Extremities normal, atraumatic, no cyanosis or edema.   Pulses: 2+ and symmetric all  extremities.   Skin: Skin color, texture, turgor normal. No rashes or lesions   Neurologic: CNII-XII grossly intact.      Data Review:     Recent Days:  Recent Labs     11/20/21  1212   WBC 20.3*   HGB 12.6   HCT 40.9   PLT 260     Recent Labs     11/20/21  1212 11/20/21  1353   NA 131* 135*   K 6.2* 4.0   CL 95* 99   CO2 34* 34*   BUN 30* 29*   MG 2.1  --    ALT 53  --      No results for input(s): PH, PCO2, PO2, HCO3, FIO2 in the last 72 hours.    24 Hour Results:  Recent Results (from the past 24 hour(s))   EKG 12 Lead    Collection Time: 11/20/21 11:58 AM   Result Value Ref Range    Ventricular Rate 90 BPM    Atrial Rate 90 BPM    P-R Interval 106 ms    QRS Duration 118 ms    Q-T Interval 398 ms    QTc Calculation (Bazett) 486 ms    P Axis 37 degrees    R Axis 150 degrees    T Axis 32 degrees    Diagnosis       Atrial-sensed ventricular-paced rhythm  Biventricular pacemaker detected  Abnormal ECG  When compared with ECG of 07-Oct-2021 15:33,  Vent. rate has decreased BY  22 BPM     CBC with Auto Differential    Collection Time: 11/20/21 12:12 PM   Result Value Ref Range    WBC 20.3 (H) 3.6 - 11.0 K/uL    RBC 3.89 3.80 - 5.20 M/uL    Hemoglobin 12.6 11.5 - 16.0 g/dL    Hematocrit 40.9 35.0 - 47.0 %    MCV 105.1 (H) 80.0 - 99.0 FL    MCH 32.4 26.0 - 34.0 PG    MCHC 30.8 30.0 - 36.5 g/dL    RDW 18.2 (H) 11.5 - 14.5 %    Platelets 260 150 - 400 K/uL  MPV 11.1 8.9 - 12.9 FL    Nucleated RBCs 0.0 0 PER 100 WBC    nRBC 0.00 0.00 - 0.01 K/uL    Neutrophils % 84 (H) 32 - 75 %    Lymphocytes % 8 (L) 12 - 49 %    Monocytes % 7 5 - 13 %    Eosinophils % 0 0 - 7 %    Basophils % 0 0 - 1 %    Immature Granulocytes 1 (H) 0.0 - 0.5 %    Neutrophils Absolute 16.9 (H) 1.8 - 8.0 K/UL    Lymphocytes Absolute 1.5 0.8 - 3.5 K/UL    Monocytes Absolute 1.4 (H) 0.0 - 1.0 K/UL    Eosinophils Absolute 0.1 0.0 - 0.4 K/UL    Basophils Absolute 0.1 0.0 - 0.1 K/UL    Absolute Immature Granulocyte 0.3 (H) 0.00 - 0.04 K/UL    Differential  Type AUTOMATED     Comprehensive Metabolic Panel    Collection Time: 11/20/21 12:12 PM   Result Value Ref Range    Sodium 131 (L) 136 - 145 mmol/L    Potassium 6.2 (H) 3.5 - 5.1 mmol/L    Chloride 95 (L) 97 - 108 mmol/L    CO2 34 (H) 21 - 32 mmol/L    Anion Gap 2 (L) 5 - 15 mmol/L    Glucose 112 (H) 65 - 100 mg/dL    BUN 30 (H) 6 - 20 MG/DL    Creatinine 1.31 (H) 0.55 - 1.02 MG/DL    Bun/Cre Ratio 23 (H) 12 - 20      Est, Glom Filt Rate 43 (L) >60 ml/min/1.37m    Calcium 12.3 (H) 8.5 - 10.1 MG/DL    Total Bilirubin 1.3 (H) 0.2 - 1.0 MG/DL    ALT 53 12 - 78 U/L    AST 112 (H) 15 - 37 U/L    Alk Phosphatase 299 (H) 45 - 117 U/L    Total Protein 6.8 6.4 - 8.2 g/dL    Albumin 2.6 (L) 3.5 - 5.0 g/dL    Globulin 4.2 (H) 2.0 - 4.0 g/dL    Albumin/Globulin Ratio 0.6 (L) 1.1 - 2.2     Extra Tubes Hold    Collection Time: 11/20/21 12:12 PM   Result Value Ref Range    Specimen HOld 1BLU,BC(2YELLOW)     Comment:        Add-on orders for these samples will be processed based on acceptable specimen integrity and analyte stability, which may vary by analyte.   Troponin    Collection Time: 11/20/21 12:12 PM   Result Value Ref Range    Troponin, High Sensitivity 18 0 - 51 ng/L   TSH    Collection Time: 11/20/21 12:12 PM   Result Value Ref Range    TSH, 3RD GENERATION 2.63 0.36 - 3.74 uIU/mL   Magnesium    Collection Time: 11/20/21 12:12 PM   Result Value Ref Range    Magnesium 2.1 1.6 - 2.4 mg/dL   Brain Natriuretic Peptide    Collection Time: 11/20/21 12:12 PM   Result Value Ref Range    NT Pro-BNP 725 (H) <125 PG/ML   Blood Culture 1    Collection Time: 11/20/21 12:12 PM    Specimen: Blood   Result Value Ref Range    Special Requests NO SPECIAL REQUESTS      Culture NO GROWTH <24 HRS     Blood Culture 2    Collection Time: 11/20/21  12:12 PM    Specimen: Blood   Result Value Ref Range    Special Requests NO SPECIAL REQUESTS      Culture NO GROWTH <24 HRS     Urinalysis with Reflex to Culture    Collection Time: 11/20/21 12:13 PM     Specimen: Urine   Result Value Ref Range    Color, UA DARK YELLOW      Appearance CLOUDY (A) CLEAR      Specific Gravity, UA 1.017 1.003 - 1.030      pH, Urine 5.5 5.0 - 8.0      Protein, UA TRACE (A) NEG mg/dL    Glucose, UA Negative NEG mg/dL    Ketones, Urine TRACE (A) NEG mg/dL    Bilirubin Urine Negative NEG      Blood, Urine SMALL (A) NEG      Urobilinogen, Urine 1.0 0.2 - 1.0 EU/dL    Nitrite, Urine Negative NEG      Leukocyte Esterase, Urine Negative NEG      WBC, UA 0-4 0 - 4 /hpf    RBC, UA 5-10 0 - 5 /hpf    Epithelial Cells UA FEW FEW /lpf    BACTERIA, URINE 1+ (A) NEG /hpf    Urine Culture if Indicated CULTURE NOT INDICATED BY UA RESULT CNI      Mucus, UA 2+ (A) NEG /lpf   BMP    Collection Time: 11/20/21  1:53 PM   Result Value Ref Range    Sodium 135 (L) 136 - 145 mmol/L    Potassium 4.0 3.5 - 5.1 mmol/L    Chloride 99 97 - 108 mmol/L    CO2 34 (H) 21 - 32 mmol/L    Anion Gap 2 (L) 5 - 15 mmol/L    Glucose 116 (H) 65 - 100 mg/dL    BUN 29 (H) 6 - 20 MG/DL    Creatinine 1.17 (H) 0.55 - 1.02 MG/DL    Bun/Cre Ratio 25 (H) 12 - 20      Est, Glom Filt Rate 49 (L) >60 ml/min/1.52m    Calcium 11.8 (H) 8.5 - 10.1 MG/DL   Lactic Acid    Collection Time: 11/20/21  1:53 PM   Result Value Ref Range    Lactic Acid, Plasma 1.7 0.4 - 2.0 MMOL/L         Imaging:     Assessment:     SYurianna Tusingis a 76y.o. 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       Plan:       Acute Cystitis  -c/w cefepime   -follow urine cultures    Decompensated Heart Failure / Combined Hear Failure  Hx of Recurrent Malignant Pleural Effusion  -no recent echo on file with reported ef 65%  -c/w lasix 472miv bid  -strict I&Os with daily weights  -repeat echo in am  -consider cardiology consult in  am    Hypercalcemia of Malignancy  Metastatic breast Cancer  -corrected ca of 13.5  -c/w lasix  -start calcitonin  -heme onc consult in am  -home xeloda    AMS / Metabolic Encephalopathy  -likely multifactorial in setting of hypercalcemia and uti  -management as outlined above  -monitor for improvement    Left femoral vein  DVT extending to inferior vena cava  -Start heparin GTT  -Consider vascular surgery consult in a.m.    Orthostatic Hypotension  -hold coreg and spironolactone while on lasix  -midodrine    Hyperkalemia  -hemolyzed sample  -repeat k    Elevated AST  -Likely from liver mets  -check ammonia    Elevated alk phos  -likely from bone mets          FEN/GI -  ns'@0ml' /hr  Activity - as tolerated  DVT prophylaxis - heparin  GI prophylaxis -  none indicated  Disposition - home    CODE STATUS:   full code       Signed By: Levon Hedger, MD     November 20, 2021

## 2021-11-20 NOTE — ED Notes (Signed)
Patient arrives by EMS from home with weakness for 5 days. Family reports AMS, A&O4 with EMS.     Hx of metastatic breast cancer and CHF.     Michell Heinrich, RN  11/20/21 [phone]

## 2021-11-21 ENCOUNTER — Inpatient Hospital Stay: Admit: 2021-11-21 | Payer: MEDICARE | Primary: Family Medicine

## 2021-11-21 DIAGNOSIS — G9340 Encephalopathy, unspecified: Secondary | ICD-10-CM

## 2021-11-21 DIAGNOSIS — C7951 Secondary malignant neoplasm of bone: Secondary | ICD-10-CM

## 2021-11-21 LAB — CBC WITH AUTO DIFFERENTIAL
Absolute Immature Granulocyte: 0.3 10*3/uL — ABNORMAL HIGH (ref 0.00–0.04)
Basophils %: 0 % (ref 0–1)
Basophils Absolute: 0 10*3/uL (ref 0.0–0.1)
Eosinophils %: 0 % (ref 0–7)
Eosinophils Absolute: 0 10*3/uL (ref 0.0–0.4)
Hematocrit: 41.1 % (ref 35.0–47.0)
Hemoglobin: 12.9 g/dL (ref 11.5–16.0)
Immature Granulocytes: 1 % — ABNORMAL HIGH (ref 0.0–0.5)
Lymphocytes %: 6 % — ABNORMAL LOW (ref 12–49)
Lymphocytes Absolute: 1.5 10*3/uL (ref 0.8–3.5)
MCH: 32.3 PG (ref 26.0–34.0)
MCHC: 31.4 g/dL (ref 30.0–36.5)
MCV: 102.8 FL — ABNORMAL HIGH (ref 80.0–99.0)
MPV: 10.6 FL (ref 8.9–12.9)
Monocytes %: 6 % (ref 5–13)
Monocytes Absolute: 1.5 10*3/uL — ABNORMAL HIGH (ref 0.0–1.0)
Neutrophils %: 87 % — ABNORMAL HIGH (ref 32–75)
Neutrophils Absolute: 22.4 10*3/uL — ABNORMAL HIGH (ref 1.8–8.0)
Nucleated RBCs: 0 PER 100 WBC
Platelets: 232 10*3/uL (ref 150–400)
RBC: 4 M/uL (ref 3.80–5.20)
RDW: 17.8 % — ABNORMAL HIGH (ref 11.5–14.5)
WBC: 25.7 10*3/uL — ABNORMAL HIGH (ref 3.6–11.0)
nRBC: 0 10*3/uL (ref 0.00–0.01)

## 2021-11-21 LAB — CULTURE, BLOOD, PCR ID PANEL RESULTS REPORT
Acinetobacter calcoac baumannii complex by PCR: NOT DETECTED
Bacteroides fragilis by PCR: NOT DETECTED
Candida albicans by PCR: NOT DETECTED
Candida auris by PCR: NOT DETECTED
Candida glabrata: NOT DETECTED
Candida krusei by PCR: NOT DETECTED
Candida parapsilosis by PCR: NOT DETECTED
Candida tropicalis by PCR: NOT DETECTED
Cryptococcus neoformans/gattii by PCR: NOT DETECTED
Enterobacter cloacae complex by PCR: NOT DETECTED
Enterobacteriaceae by PCR: NOT DETECTED
Enterococcus faecalis by PCR: NOT DETECTED
Enterococcus faecium by PCR: NOT DETECTED
Escherichia Coli: NOT DETECTED
Haemophilus Influenzae by PCR: NOT DETECTED
Klebsiella aerogenes by PCR: NOT DETECTED
Klebsiella oxytoca by PCR: NOT DETECTED
Klebsiella pneumoniae group by PCR: NOT DETECTED
Listeria monocytogenes by PCR: NOT DETECTED
Neisseria meningitidis by PCR: NOT DETECTED
Proteus by PCR: NOT DETECTED
Pseudomonas aeruginosa: NOT DETECTED
STAPHYLOCOCCUS: DETECTED — AB
STREPTOCOCCUS: NOT DETECTED
Salmonella species by PCR: NOT DETECTED
Serratia marcescens by PCR: NOT DETECTED
Staphylococcus Aureus: NOT DETECTED
Staphylococcus epidermidis by PCR: NOT DETECTED
Staphylococcus lugdunensis by PCR: NOT DETECTED
Stenotrophomonas maltophilia by PCR: NOT DETECTED
Strep pneumoniae: NOT DETECTED
Strep pyogenes,(Grp. A): NOT DETECTED
Streptococcus agalactiae (Group B): NOT DETECTED

## 2021-11-21 LAB — EKG 12-LEAD
Atrial Rate: 90 {beats}/min
P Axis: 37 degrees
P-R Interval: 106 ms
Q-T Interval: 398 ms
QRS Duration: 118 ms
QTc Calculation (Bazett): 486 ms
R Axis: 150 degrees
T Axis: 32 degrees
Ventricular Rate: 90 {beats}/min

## 2021-11-21 LAB — COMPREHENSIVE METABOLIC PANEL W/ REFLEX TO MG FOR LOW K
ALT: 51 U/L (ref 12–78)
AST: 61 U/L — ABNORMAL HIGH (ref 15–37)
Albumin/Globulin Ratio: 0.9 — ABNORMAL LOW (ref 1.1–2.2)
Albumin: 2.8 g/dL — ABNORMAL LOW (ref 3.5–5.0)
Alk Phosphatase: 298 U/L — ABNORMAL HIGH (ref 45–117)
Anion Gap: 11 mmol/L (ref 5–15)
BUN: 27 MG/DL — ABNORMAL HIGH (ref 6–20)
Bun/Cre Ratio: 24 — ABNORMAL HIGH (ref 12–20)
CO2: 29 mmol/L (ref 21–32)
Calcium: 11.3 MG/DL — ABNORMAL HIGH (ref 8.5–10.1)
Chloride: 96 mmol/L — ABNORMAL LOW (ref 97–108)
Creatinine: 1.13 MG/DL — ABNORMAL HIGH (ref 0.55–1.02)
Est, Glom Filt Rate: 51 mL/min/{1.73_m2} — ABNORMAL LOW (ref >60)
Globulin: 3.2 g/dL (ref 2.0–4.0)
Glucose: 146 mg/dL — ABNORMAL HIGH (ref 65–100)
Potassium: 3.8 mmol/L (ref 3.5–5.1)
Sodium: 136 mmol/L (ref 136–145)
Total Bilirubin: 1.4 MG/DL — ABNORMAL HIGH (ref 0.2–1.0)
Total Protein: 6 g/dL — ABNORMAL LOW (ref 6.4–8.2)

## 2021-11-21 LAB — ECHO (TTE) COMPLETE (PRN CONTRAST/BUBBLE/STRAIN/3D)
AV Peak Gradient: 1 mmHg
AV Peak Velocity: 0.6 m/s
AV Velocity Ratio: 1
Ao Root Index: 1.72 cm/m2
Aortic Root: 2.9 cm
Body Surface Area: 1.7 m2
E/E' Lateral: 11.4
E/E' Ratio (Averaged): 10.45
E/E' Septal: 9.5
Fractional Shortening 2D: 26 % (ref 28–44)
IVSd: 0.7 cm
LA Diameter: 2 cm
LA Size Index: 1.18 cm/m2
LA/AO Root Ratio: 0.69
LV E' Lateral Velocity: 5 cm/s
LV E' Septal Velocity: 6 cm/s
LV Mass 2D Index: 38.9 g/m2 — AB (ref 43–95)
LV Mass 2D: 65.8 g — AB (ref 67–162)
LV RWT Ratio: 0.47
LVIDd Index: 2.01 cm/m2
LVIDd: 3.4 cm — AB (ref 3.9–5.3)
LVIDs Index: 1.48 cm/m2
LVIDs: 2.5 cm
LVOT Peak Gradient: 2 mmHg
LVOT Peak Velocity: 0.6 m/s
LVPWd: 0.8 cm
MV A Velocity: 0.79 m/s
MV Area by PHT: 4 cm2
MV E Velocity: 0.57 m/s
MV E Wave Deceleration Time: 188.2 ms
MV E/A: 0.72
MV PHT: 54.6 ms
PV Max Velocity: 0.9 m/s
PV Peak Gradient: 3 mmHg
Pulmonary Artery EDP: 11 mmHg
TR Max Velocity: 2.37 m/s
TR Peak Gradient: 23 mmHg

## 2021-11-21 LAB — PROTIME-INR
INR: 1.2 — ABNORMAL HIGH (ref 0.9–1.1)
Protime: 12.6 s — ABNORMAL HIGH (ref 9.0–11.1)

## 2021-11-21 LAB — APTT: PTT: 25.1 s (ref 22.1–31.0)

## 2021-11-21 LAB — HEPARIN, ANTI-XA
Heparin Xa,LMWH and Unfrac: 0.12 IU/mL
Heparin Xa,LMWH and Unfrac: >1.5 IU/mL
Heparin Xa,LMWH and Unfrac: >1.5 IU/mL

## 2021-11-21 LAB — CULTURE, URINE: Culture: NO GROWTH

## 2021-11-21 LAB — VAS DUP LOWER EXTREMITY VENOUS BILATERAL: Body Surface Area: 1.81 m2

## 2021-11-21 MED ORDER — ZOLEDRONIC ACID 4 MG/100ML IV SOLN
4 MG/100ML | Freq: Once | INTRAVENOUS | Status: AC
Start: 2021-11-21 — End: 2021-11-21
  Administered 2021-11-21: 21:00:00 4 mg via INTRAVENOUS

## 2021-11-21 MED ORDER — PERFLUTREN LIPID MICROSPHERE IV SUSP
INTRAVENOUS | Status: AC
Start: 2021-11-21 — End: ?

## 2021-11-21 MED ORDER — CALCITONIN (SALMON) 200 UNIT/ML IJ SOLN
200 UNIT/ML | Freq: Two times a day (BID) | INTRAMUSCULAR | Status: AC
Start: 2021-11-21 — End: 2021-11-22
  Administered 2021-11-22 (×2): 254 [IU]/kg via SUBCUTANEOUS

## 2021-11-21 MED ORDER — PERFLUTREN LIPID MICROSPHERE IV SUSP
Freq: Once | INTRAVENOUS | Status: AC | PRN
Start: 2021-11-21 — End: 2021-11-21
  Administered 2021-11-21: 21:00:00 1.5 mL via INTRAVENOUS

## 2021-11-21 MED ORDER — SODIUM CHLORIDE 0.9 % IV SOLN
0.9 % | Freq: Once | INTRAVENOUS | Status: DC
Start: 2021-11-21 — End: 2021-11-21

## 2021-11-21 MED ORDER — CALCITONIN (SALMON) 200 UNIT/ML IJ SOLN
200 UNIT/ML | Freq: Two times a day (BID) | INTRAMUSCULAR | Status: DC
Start: 2021-11-21 — End: 2021-11-21

## 2021-11-21 MED FILL — CEFEPIME HCL 2 G IV SOLR: 2 g | INTRAVENOUS | Qty: 2

## 2021-11-21 MED FILL — ZOLEDRONIC ACID 4 MG/5ML IV CONC: 4 MG/5ML | INTRAVENOUS | Qty: 5

## 2021-11-21 MED FILL — HEPARIN SOD (PORCINE) IN D5W 100 UNIT/ML IV SOLN: 100 UNIT/ML | INTRAVENOUS | Qty: 250

## 2021-11-21 MED FILL — PERFLUTREN LIPID MICROSPHERE IV SUSP: INTRAVENOUS | Qty: 1.5

## 2021-11-21 MED FILL — MIACALCIN 200 UNIT/ML IJ SOLN: 200 UNIT/ML | INTRAMUSCULAR | Qty: 1.27

## 2021-11-21 MED FILL — PREDNISONE 10 MG PO TABS: 10 MG | ORAL | Qty: 1

## 2021-11-21 MED FILL — PERFLUTREN LIPID MICROSPHERE IV SUSP: INTRAVENOUS | Qty: 2

## 2021-11-21 MED FILL — ZOLEDRONIC ACID 4 MG/100ML IV SOLN: 4 MG/100ML | INTRAVENOUS | Qty: 100

## 2021-11-21 MED FILL — FUROSEMIDE 10 MG/ML IJ SOLN: 10 MG/ML | INTRAMUSCULAR | Qty: 4

## 2021-11-21 MED FILL — MIDODRINE HCL 5 MG PO TABS: 5 MG | ORAL | Qty: 1

## 2021-11-21 MED FILL — MIACALCIN 200 UNIT/ML IJ SOLN: 200 UNIT/ML | INTRAMUSCULAR | Qty: 1.43

## 2021-11-21 NOTE — Progress Notes (Signed)
TRANSFER - OUT REPORT:    Verbal report given to Unang RN on Kamica Florance  being transferred to [place]  for routine progression of patient care       Report consisted of patient's Situation, Background, Assessment and   Recommendations(SBAR).     Information from the following report(s) Index, Adult Overview, MAR, and Cardiac Rhythm    was reviewed with the receiving nurse.           Lines:   Peripheral IV 11/20/21 Left;Anterior Hand (Active)   Site Assessment Clean, dry & intact 11/20/21 2200   Line Status Flushed;Capped 11/20/21 2200   Line Care Connections checked and tightened 11/20/21 2200   Phlebitis Assessment No symptoms 11/20/21 2200   Infiltration Assessment 0 11/20/21 2200   Alcohol Cap Used Yes 11/20/21 2200   Dressing Status Clean, dry & intact 11/20/21 2200   Dressing Type Transparent w/CHG gel 11/20/21 2200       Peripheral IV 24/40/10 Right Cephalic (Active)        Opportunity for questions and clarification was provided.      Patient transported with:  Monitor and Registered Nurse

## 2021-11-21 NOTE — Other (Signed)
Heart Failure Nurse Navigator Chart Review performed. Echo pending.  Full note to follow.

## 2021-11-21 NOTE — Progress Notes (Addendum)
RN received call from laboratory at 0300 regarding Heparin-Xa sample sent at 0056 on 8/28. Results were repeated and seemed to be extremely high, and lab requested re-draw of Xa level. RN confirmed that method of collection was straight stick on opposite arm of the heparin infusion, and will re-draw for confirmation. re-drew sample after pausing drip for 5 min and using straight stick method on opposite arm of heparin drip. Lab sent for analysis.     52: RN received call from coagulation - Heparin Xa level > 1.5 RN paused heparin drip for 60 min per protocol

## 2021-11-21 NOTE — Plan of Care (Signed)
Problem: Discharge Planning  Goal: Discharge to home or other facility with appropriate resources  Outcome: Progressing  Flowsheets (Taken 11/20/2021 2322 by Irena Reichmann, RN)  Discharge to home or other facility with appropriate resources: Identify barriers to discharge with patient and caregiver     Problem: Safety - Adult  Goal: Free from fall injury  Outcome: Progressing

## 2021-11-21 NOTE — Progress Notes (Signed)
Occupational Therapy: hold    Orders received and chart reviewed. Pt with acute L femoral vein DVT and heparin-XA is outside of parameters to mobilize (supratherapeutic at >1.50). Will hold OT at this time and will follow-up as able and appropriate.    Abelino Derrick, OTR/L

## 2021-11-21 NOTE — Progress Notes (Signed)
[facility]                                                                                          Hospitalist Progress Note  Haskell Flirt, MD  Answering service: [phone] OR 63 from in house phone        Date of Service:  11/21/2021  NAME:  Tenna Lacko  DOB:  24-Aug-1945  MRN:  [id number]       Admission Summary:   Patient is a 76 year old female with a past medical history of stage IV breast cancer with metastatic disease to bones and liver, on oral chemotherapy, history of combined systolic and diastolic congestive heart failure, status post ICD, orthostatic hypotension, GERD, and cancer related chronic pain, who came to the emergency room due to generalized weakness, worsening confusion, feeling weak and tired, decreased p.o. intake and incoherent speech for the past 24 hours.  She was admitted for further work-up.       Interval history / Subjective:   History, current findings, and plan of care were extensively discussed with patient, her husband at the bedside, and daughter.  Patient had a recent admission to [facility] with bilateral pleural effusions, status post thoracentesis.  Pleural effusions were malignant.  Patient's usual oncologist is Dr. Elinor Parkinson, she has not established follow-up in the [facility] area yet.       Assessment & Plan:        Altered mental status;  Generalized weakness;  Aggressive worsening of functional status;  Stage IV breast cancer with metastatic disease to bones and liver;  -Oncology consulted;  -Patient has well-established follow-up with Dr. Elinor Parkinson;   -Palliative medicine consulted;    Extensive acute left proximal and distal vein DVT:  -Confirmed on Doppler ultrasound this morning;  -Started heparin drip on admission;  -Consult vascular surgery;  -Patient will need lifelong anticoagulation;    Hypercalcemia of malignancy:  -Initial calcium 12.3, down to 11.3 this morning;  -Consult nephrology for  recommendations on management    Acute cystitis:  -Started on cefepime on admission;  -Follow urine culture results;    Bilateral pleural effusions:  -Malignant;   -Patient had a thoracentesis about 6 weeks ago;  -She is currently on room air and in no acute respiratory distress;   -Discussed with family at the bedside: Will defer thoracentesis at this time, consult IR if respiratory status deteriorates;    Orthostatic hypotension  -Home BP medications held on admission;    Hyponatremia:  -Likely hypovolemic, corrected overnight;       Code status: full  Prophylaxis: Heparin drip  Care Plan discussed with: patient, husband and daughter at the bedside, RN, Dr. Gorden Harms;   Anticipated Disposition: home, TBD;            Principal Problem:    AMS (altered mental status)  Resolved Problems:    * No resolved hospital problems. *         Social Determinants of Health     Tobacco Use: Unknown    Smoking Tobacco Use: Never    Smokeless Tobacco Use: Unknown  Passive Exposure: Not on file   Alcohol Use: Not on file   Financial Resource Strain: Not on file   Food Insecurity: Not on file   Transportation Needs: Not on file   Physical Activity: Not on file   Stress: Not on file   Social Connections: Not on file   Intimate Partner Violence: Not on file   Depression: Not on file   Housing Stability: Not on file       Review of Systems:   Pertinent items are noted in HPI.       Vital Signs:    Last 24hrs VS reviewed since prior progress note. Most recent are:  Vitals:    11/21/21 0803   BP: 119/80   Pulse: (!) 104   Resp:    Temp: 97.8 F (36.6 C)   SpO2: 95%         Intake/Output Summary (Last 24 hours) at 11/21/2021 0809  Last data filed at 11/20/2021 2152  Gross per 24 hour   Intake 10 ml   Output 1550 ml   Net -1540 ml        Physical Examination:     I had a face to face encounter with this patient and independently examined them on 11/21/2021 as outlined below:          General : alert x 3, awake, no acute distress,    HEENT: EOMI, dry mucus membrane, no scleroicterus  Neck: supple, no JVD, no meningeal signs  Chest: Clear to auscultation bilaterally, RRR;  CVS: S1 S2 heard, RRR, pacemaker Left upper chest;  Abd: soft/ non tender, non distended, BS physiological,   Ext: no clubbing, no cyanosis, no edema,   Neuro/Psych: pleasant mood and affect, no focal neurological deficits  Skin: warm     Data Review:    Review and/or order of clinical lab test  Review and/or order of tests in the radiology section of CPT  Review and/or order of tests in the medicine section of CPT      I have personally and independently reviewed all pertinent labs, diagnostic studies, imaging, and have provided independent interpretation of the same.     Labs:     Recent Labs     11/20/21  1918 11/21/21  0056   WBC 25.2* 25.7*   HGB 12.8 12.9   HCT 41.5 41.1   PLT 242 232     Recent Labs     11/20/21  1212 11/20/21  1353 11/21/21  0056   NA 131* 135* 136   K 6.2* 4.1  4.0 3.8   CL 95* 99 96*   CO2 34* 34* 29   BUN 30* 29* 27*   MG 2.1  --   --    PHOS  --  2.4*  --      Recent Labs     11/20/21  1212 11/21/21  0056   ALT 53 51   GLOB 4.2* 3.2     Recent Labs     11/20/21  1918   INR 1.2*   APTT 25.1      No results for input(s): TIBC, FERR in the last 72 hours.    Invalid input(s): FE, PSAT   No results found for: FOL, RBCF   No results for input(s): PH, PCO2, PO2 in the last 72 hours.  No results for input(s): CPK in the last 72 hours.    Invalid input(s): CPKMB, CKNDX, TROIQ  No results found for: CHOL, [place],  CHLST, CHOLV, HDL, HDLC, LDL, LDLC, TGLX, TRIGL  No results found for: GLUCPOC  '@LABUA'$ @    Notes reviewed from all clinical/nonclinical/nursing services involved in patient's clinical care. Care coordination discussions were held with appropriate clinical/nonclinical/ nursing providers based on care coordination needs.         Patients current active Medications were reviewed, considered, added and adjusted based on the clinical condition today.       Home Medications were reconciled to the best of my ability given all available resources at the time of admission. Route is PO if not otherwise noted.      Admission Status:[id number]:::1}      Medications Reviewed:     Current Facility-Administered Medications   Medication Dose Route Frequency    calcitonin (MIACALCIN) injection 286 Units  4 Units/kg IntraMUSCular BID    furosemide (LASIX) injection 40 mg  40 mg IntraVENous BID    midodrine (PROAMATINE) tablet 2.5 mg  2.5 mg Oral BID    sodium chloride flush 0.9 % injection 5-40 mL  5-40 mL IntraVENous 2 times per day    sodium chloride flush 0.9 % injection 5-40 mL  5-40 mL IntraVENous PRN    0.9 % sodium chloride infusion   IntraVENous PRN    ondansetron (ZOFRAN-ODT) disintegrating tablet 4 mg  4 mg Oral Q8H PRN    Or    ondansetron (ZOFRAN) injection 4 mg  4 mg IntraVENous Q6H PRN    polyethylene glycol (GLYCOLAX) packet 17 g  17 g Oral Daily PRN    acetaminophen (TYLENOL) tablet 650 mg  650 mg Oral Q6H PRN    Or    acetaminophen (TYLENOL) suppository 650 mg  650 mg Rectal Q6H PRN    predniSONE (DELTASONE) tablet 10 mg  10 mg Oral Daily    ceFEPIme (MAXIPIME) 2,000 mg in sodium chloride 0.9 % 50 mL IVPB (mini-bag)  2,000 mg IntraVENous Q24H    heparin (porcine) injection 5,700 Units  80 Units/kg IntraVENous PRN    heparin (porcine) injection 2,850 Units  40 Units/kg IntraVENous PRN    heparin 25,000 units in dextrose 5% 250 mL (premix) infusion  18-30 Units/kg/hr IntraVENous Continuous     ______________________________________________________________________  EXPECTED LENGTH OF STAY: Unable to retrieve estimated LOS  ACTUAL LENGTH OF STAY:          1                 Haskell Flirt, MD

## 2021-11-21 NOTE — Consults (Addendum)
Nephrology Consult Note      Assessment:    AKI: Serum Cr 1.'3mg'$ /dl->> improving to 1.'1mg'$ /dl. Suspected pre-renal state.     Hypercalcemia: Peak corrected Ca 13.4. Improving with IV Furosemide/Calcitonin. Corrected Ca today 12.2. Etiology likely 2 to metastatic Breast CA.    Combined Systolic/Diastolic CHF: +pulm edema/b/l pleural effusions    Metastatic Breast CA    Staph Bacteremia    Plan/Recommendations:  Would dose her with IV Zometa this evening  Stop IV Lasix  Ct Calcitonin (total of 4 doses)  Trend Calcium level  F/u Bld Cx. IV Abx  Overall prognosis guarded/poor  AM labs      Discussed with patient/husband    Thanks for the consultation. Renal service will follow patient with you. Please contact me with any questions or concerns.    Initial Consult note         Patient name: Ariel Day  MR no: [id number]  Date of admission: 11/20/2021  Date of consultation: 11/21/21  Requested by: Dr. Levon Hedger  Reason for consult: Hypercalcemia    Patient seen and examined. History obtained from patient and chart review. Relevant labs, data and notes reviewed.      HPI: Ariel Day is a 76 y.o. female with PMH significant for Metastatic Breast CA, Combined Systolic/Diastolic CHF presented with worsening fatigue/confusion. Admission lab work significant for elevated serum Cr 1.'3mg'$ /dl, K 6.2 (hemolyzed) and Ca 12.3 (corrected 13.4). Nephrology consulted to evaluate and manage hypercalcemia. Patient remains pretty lethargic. Husband at bedside answered most to the questions. +Constipation/abd pain. No n/v. Denies any OTC Calcium supplements.     PMH:  Past Medical History:   Diagnosis Date    Breast cancer ([place])     Cardiomyopathy ([place])     EF10%; 65% 2022    Malignant pleural effusion     Orthostatic hypotension       PSH:  Past Surgical History:   Procedure Laterality Date    CARDIAC DEFIBRILLATOR PLACEMENT Left 2012    CHOLECYSTECTOMY      JOINT REPLACEMENT Bilateral     MASTECTOMY, BILATERAL           Social history:   Social History       Tobacco History       Smoking Status  Never      Smokeless Tobacco Use  Unknown              Alcohol History       Alcohol Use Status  Not Currently              Drug Use       Drug Use Status  Never              Sexual Activity       Sexually Active  Not Asked                     Family history:  Not contributory    No Known Allergies    Current Facility-Administered Medications   Medication Dose Route Frequency Provider Last Rate Last Admin    furosemide (LASIX) injection 40 mg  40 mg IntraVENous BID Bleck B Ngwafang, MD   40 mg at 11/21/21 0936    midodrine (PROAMATINE) tablet 2.5 mg  2.5 mg Oral BID Bleck B Ngwafang, MD   2.5 mg at 11/21/21 0936    sodium chloride flush 0.9 % injection 5-40 mL  5-40 mL IntraVENous 2 times per day Bleck B Ngwafang,  MD   10 mL at 11/20/21 2124    sodium chloride flush 0.9 % injection 5-40 mL  5-40 mL IntraVENous PRN Bleck B Ngwafang, MD        0.9 % sodium chloride infusion   IntraVENous PRN Bleck B Ngwafang, MD        ondansetron (ZOFRAN-ODT) disintegrating tablet 4 mg  4 mg Oral Q8H PRN Bleck B Ngwafang, MD        Or    ondansetron (ZOFRAN) injection 4 mg  4 mg IntraVENous Q6H PRN Bleck B Ngwafang, MD        polyethylene glycol (GLYCOLAX) packet 17 g  17 g Oral Daily PRN Bleck B Ngwafang, MD        acetaminophen (TYLENOL) tablet 650 mg  650 mg Oral Q6H PRN Bleck B Ngwafang, MD        Or    acetaminophen (TYLENOL) suppository 650 mg  650 mg Rectal Q6H PRN Bleck B Ngwafang, MD        predniSONE (DELTASONE) tablet 10 mg  10 mg Oral Daily Bleck B Ngwafang, MD   10 mg at 11/21/21 0936    ceFEPIme (MAXIPIME) 2,000 mg in sodium chloride 0.9 % 50 mL IVPB (mini-bag)  2,000 mg IntraVENous Q24H Bleck B Ngwafang, MD        heparin (porcine) injection 5,700 Units  80 Units/kg IntraVENous PRN Bleck B Ngwafang, MD        heparin (porcine) injection 2,850 Units  40 Units/kg IntraVENous PRN Bleck B Ngwafang, MD        heparin 25,000 units in dextrose 5%  250 mL (premix) infusion  18-30 Units/kg/hr IntraVENous Continuous Bleck B Ngwafang, MD 10.7 mL/hr at 11/21/21 0553 15 Units/kg/hr at 11/21/21 0553        ROS (besides HPI):    General: No fever. +Fatigue. No weight changes  ENT: No hearing loss or visual changes  Cardiovascular: No CP, No DOE. No edema  Pulmonary: No SOB  GI: + abdominal pain. +constipation. No nausea. No vomiting. No blood in stool  GU: No blood in urine. No foamy or cloudy urine  Musculoskeletal: No joint swelling or redeness. No morning stiffness  Endocrine: no cold or heat intolerance  Psych: denies anxiety or depression  Neuro: no light headedness or dizziness    Objective   BP 108/68   Pulse 100   Temp 97.6 F (36.4 C) (Oral)   Resp 18   Ht 1.651 m ('5\' 5"'$ )   Wt 63.7 kg (140 lb 6.4 oz)   SpO2 96%   BMI 23.36 kg/m     Physical Exam:    Gen: Elderly/Frail    HEENT: AT/NC, EOMI, moist mucous membrane, no scleral icterus    Neck: no JVD, no cervical lymphadenopathy, no carotid bruit    Lungs/Chest wall: Breath sounds normal. Symmetrical chest wall expansion. No accessory muscle use. Clear to auscultation    Cardiovascular: Normal S1/S2, normal rate, regular rhythm.     Abdomen: soft, NT, ND, BS+, no HSM    Ext: no clubbing or cyanosis. No edema    Skin: warm and dry. No rashes    GU: no foley    CNS: alert awake. Answers appropriately.     Labs/Data:    Lab Results   Component Value Date/Time    NA 136 11/21/2021 12:56 AM    K 3.8 11/21/2021 12:56 AM    CL 96 11/21/2021 12:56 AM    CO2 29 11/21/2021 12:56 AM  BUN 27 11/21/2021 12:56 AM    CREATININE 1.13 11/21/2021 12:56 AM    GLUCOSE 146 11/21/2021 12:56 AM    CALCIUM 11.3 11/21/2021 12:56 AM    LABGLOM 51 11/21/2021 12:56 AM        Lab Results   Component Value Date    WBC 25.7 (H) 11/21/2021    HGB 12.9 11/21/2021    HCT 41.1 11/21/2021    MCV 102.8 (H) 11/21/2021    PLT 232 11/21/2021       Urine analysis:noted        No results found for: SPEP, UPEP  '@lastutp'$ @  No results found  for: MALBCR      Intake/Output Summary (Last 24 hours) at 11/21/2021 1251  Last data filed at 11/20/2021 2152  Gross per 24 hour   Intake 10 ml   Output 1550 ml   Net -1540 ml       Wt Readings from Last 3 Encounters:   11/20/21 63.7 kg (140 lb 6.4 oz)   10/08/21 64.9 kg (143 lb)       Signed by:    Marygrace Drought, MD  Nephrology and Hypertension  [facility] ([place])  [email or web address]    Office: [phone]

## 2021-11-21 NOTE — Consults (Addendum)
[facility]  [address] [place] [address]  W: [phone]  F: [phone]    Reason for Evaluation:   Ariel Day is a 76 y.o. 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 Dairl Ponder for evaluation of metastatic breast cancer.      Hematology/Oncology Treatment History:   Follows at [facility] with Dr. Stephens November  ER+/PR+ lobular breast cancer diagnosed 12/20/2015 after presenting with multifocal bone mets  Current treatment: oral capecitabine 500 mg BID one week on, one week off.  Has been on hold since early August.      History of Present Illness:   Ariel Day is a pleasant 76 y.o. female who presents today for evaluation of metastatic breast cancer.      Patient admitted with complaints of generalized weakness, decreased PO intake, and confusion. Vitals on admission were stable.  Labs notable for WBC 20.3, left-shifted, Na 131, K 6.2, Cr 1.31, calcium 13.5, AST 112, alk phos 299, albumin 2.6.  CXR showed pulmonary edema with bilateral pleural effusions as well as lytic lesions in scapulae. CT head negative for acute abnormality.  UA with small blood and 1+ bacteria. She was treated with Lasix and empiric cefepime and admitted to medicine.    CT A/P noted large filling defect extending from L femoral vein to inferior portion of IVC, concerning for DVT; multiple hepatic mets; diffuse skeletal mets; and small volume ascites.  Doppler US with acute occlusive thrombus in L external iliac, common femoral, saphenofemoral junction, profunda femoral, and femoral. Subacute occlusive thrombus in the popliteal and posterior tibial veins. Limited visualization of the peroneal veins but show color filling; cannot exclude non-occlusive thrombus. No DVT in RLE.    At my evaluation, patient remains  confused.  She is able to answer some questions but not fully provide history.  Her husband states that they called their oncologist on Friday about the confusion and dehydration, and he advised trying Pedialyte/hydration, and if persisted to present to ER.  She has been off her capecitabine for ~2 weeks.    Per Dr. Venetia Constable outpatient note 08/2021:  a. Initially presented w/ severe LBP to [facility] ER 12/01/15, where plain films were unremarkable but CT of L spine showed "Extensive neoplastic infiltration of the lumbar spine -- metastatic disease versus multiple myeloma. Neoplastic extension into the spinal canal at multiple levels, potentially with significant neoplastic thecal sac compression at L2-L3.  Minor pathologic fractures through the neoplastically infiltrated T12 and L3 vertebral bodies."  b. CT of H/C/A/P (12/03/15): Calvarial lesions but normal brain and upper CT spine; Diffuse neoplastic infiltration of the axial skeleton. In addition to the known diffuse lytic lesions in the lumbar spine, there are additional lytic lesions throughout the thoracic spine, sternum, ribs, and iliac crests. Some of these areas demonstrate cortical breakthrough suggestive of pathologic fractures at T8, T12 and, most notably, L3. As noted on the prior report, the extensive infiltration of the L3 vertebral body encroaches into the spinal canal with resultant stenosis. Diffuse pleural nodularity with multiple bilateral subpleural nodules. There are associated small bilateral pleural effusions. Main diagnostic considerations for the above findings include metastatic disease or multiple myeloma  c. Diagnostic BMBx (12/06/15) preliminary is negative for overt myeloma, negative flow cytometry, no core and clot section for any increase or  restriction in plasma cells.  SPEP w/ IFE, random UPEP w/ IFE, immunoglobulin panel, SFLCA all normal.  d. Repeat diagnostic biopsy of iliac mass (12/08/15) appears consistent with lobular breast  cancer, CA 27-29 was 965, CEA 3.8, CA 19-9 just 7.  e. Bone scan (05/09/16) reveals that there has been interval progression of widespread osseous metastatic disease. There is new unusual nonspecific activity about both knees, involving both distal femurs and proximal tibias, right greater than left, also involving the left distal tibia.  This activity is of uncertain etiology and may be related to marrow reaction versus marrow replacement by metastatic disease.  f. X-ray L femur (08/28/16) shows status post left hip arthroplasty with good position of the compartments. No evidence of metastatic disease to the left femur.  g. Pelvis XR (08/28/16) Lucent lesion posterior left iliac crest may represent treated lesion which represented a lytic lesion on prior CT. No definite new lytic lesions. Severe degenerative change right hip. Status post left hip arthroplasty.  h. CT c/a/p (11/24/16) reveals significant interval improvement of pleural-based metastatic disease. Decreased heterogeneity and thickening of the endometrium, though this remains abnormal, suspicious for endometrial neoplasm. Interval sclerosis of osseous metastases, likely healing response. No new lesions identified.  i. Bone scan (11/24/16) documents diffuse osseous metastatic disease, not significantly changed from the prior bone scintigram.  j. Sequence analysis and deletion/duplication testing of the 46 genes (APC, ATM, AXIN2, BARD1, BMPR1A, BRCA1, BRCA2, BRIP1, CDH1, CDKN2A (p14ARF), CDKN2A (p16INK4a), CHEK2, CTNNA1, DICER1, EPCAM (EPCAM: Deletion/duplication testing only ([id number]).), GREM1 (GREM1: Promoter region deletion/duplication testing only.), KIT, MEN1, MLH1, MSH2, MSH3, MSH6, MUTYH, NBN, NF1, PALB2, PDGFRA, PMS2, POLD1, POLE, PTEN, RAD50, RAD51C, RAD51D, SDHB, SDHC, SDHD, SMAD4, SMARCA4, STK11, TP53, TSC1, TSC2 VHL. The following genes were evaluated for sequence changes only: HOXB13 (c.251G>A, p.Gly84Glu variant only), NTHL1 (NTHL1:   Deletion/duplication analysis is not offered for this gene ([id number]).), SDHA. Results are negative unless otherwise indicated.  k. CT Right lower extremity (08/03/17) shows no convincing evidence of metastatic disease in the right femur. There is a probable small treated metastasis in the right ischium. Advanced right hip DJD. Pattern of sclerosis in the medial and lateral femoral condyles suggesting chronic AVN.  Marland Kitchen CT L-spine (08/03/17)  Extensive osseous metastatic disease having mixed lytic/sclerotic features, all unchanged compared with body CT from 11/24/2016. No evidence of disease progression. No significant disc disease at any level. CT pelvis 08/03/17) documents pelvic metastatic disease appears stable. Severe right hip osteoarthritis without significant change.  l. CTA (08/14/18) shows no PE or acute aortic syndrome. Bilateral intermediate sized pleural effusions. Extensive osseous metastatic disease within the sternum, vertebral column and ribs. No pathologic fracture. CXR reveals small bilateral pleural effusions. Retrocardiac opacity, which may represent atelectasis or infiltrate. CT right lower extremity reveals severe chronic AVN with head collapse/pathologic fracture and associated severe right hip joint degenerative changes. Findings of bone metastasis with a destructive cortical lesion involving the right iliac bone and 2 small femoral lesions. Whole-body bone scan may be helpful for further evaluation of the skeleton. 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  m. Radiation Oncology Re-evaluation (08/14/18) for symptomatic bone mets. Palliative radiotherapy recommended to the right femur and right lateral ribcage, completed 09/18/18.  n. Bone scan (09/10/18) Significant increase in multifocal abnormal uptake in keeping with progression of osseous metastatic involvement. Consider CT imaging of the chest/abdomen/pelvis and CT or MR of the head, unless more recently performed cross-sectional  imaging studies have been performed at outside institutions (the  most recent CT imaging of the chest/abdomen/pelvis available is dated 11/24/2016).  o. CTA chest (09/25/2018) No pulmonary thromboemboli. Small pleural effusions with associated regions of compressive atelectasis. Progressive osseous metastatic disease with multiple new osteolytic metastases as described. There is probably a pathologic fracture associated with the metastasis involving the lateral right fifth rib. The T5 lesion violates the posterior cortex with epidural extension, the degree of encroachment upon the thecal sac is not well assessed by this modality. Thoracic spine MRI could be performed to reevaluate depending on the patient's symptoms.  p. Radiation Oncology Re-evaluation (09/26/18) Palliative radiotherapy recommended to T5 and T6 vertebral bodies over 10 fractions, started 10/08/18.  q. Recommended to rotate to Letrozole and Palbociclib on 10/29/18 due to disease progression (she received radiation to her right hip as well as areas of her spine and ribs immediately prior to this transition).  r. ED to admission for acute cholecystitis (02/21/19) with subsequent lap chole on 11/30. Pathology [phone]) reveals cholecystitis with ulceration. Focal hepatic parenchyma with steatosis. One reactive benign lymph node.  s. Patient resumed therapy with Ibrance (75 mg daily, 3 weeks on and 1 week off) and Letrozole on 04/15/19.   t. Right femur x-ray (08/15/19) evidence of interval right total hip arthroplasty. No evidence of hardware failure or loosening. Lytic lesion along the posterior aspect of the right mid femoral diaphysis, suspicious for new metastatic lesion, new since 08/03/2017 CT. Diffuse osteopenia. Several additional small lytic osseous metastatic lesions are suspected particularly involving the proximal fibular diaphysis anteriorly, but also seen more diffusely.   u. Bone scan (09/15/19)  Interval progression in number and extent  of involvement of diffuse osseous metastatic disease. Only subtle radiotracer uptake adjacent to the predominantly lytic right femoral metadiaphyseal lesion. No findings of hip arthroplasty loosening or instability.  v. RFA to painful lesion in right femur (09/19/19).  w. CT head (09/24/19) No CT evidence of intracranial metastatic disease. However, MRI is more sensitive for detection of small metastases. Extensive lytic lesions throughout the calvarium, may represent metastatic disease or multiple myeloma.   x. CT c/a/p (09/24/19) Interval development of moderate to large right and small-to-moderate left pleural effusions with adjacent atelectasis. Interval development of diffuse hepatic metastatic disease with largest lesion measuring 2.0 cm. Extensive osseous metastatic disease  y. Capecitadine 1500 mg bid, 1 week on and 1 week off, C1D1 on 10/03/19  z. Thoracentesis (10/07/19) Pathology of right pleural fluid ([id number]) Positive for malignancy. Few clusters of atypical cells present, consistent with metastatic breast carcinoma. ER+ (3+, 90%). PR- (0+, 0%). HER-2/NEU negative. (0+).  aa. CXR (10/16/19) Small, but slightly increased, right pleural effusion. Small left pleural effusion similar to prior. Known osseous metastatic disease with expansile left-sided rib lesions.  ab. ED presentation with subsequent admission (07/30/20) for decreased urine output, cough and shortness of breath. Found to be COVID+.  CXR - Left pleural effusion and probable right pleural effusion, not appreciably changed. No evidence of consolidation. CTA chest -  No evidence of pulmonary embolism. Moderately large (right greater than left) pleural effusions with accompanying basilar  atelectasis. Redemonstration of diffuse osseous metastatic disease without superimposed acute complicating feature. CT a/p - Mildly thick-walled urinary bladder, correlation for cystitis may be helpful as appropriate clinically. > No evidence of hydronephrosis or  pyelonephritis. No abnormal bowel wall thickening or inflammatory change. Regarding oncologic findings: > Significant interval improvement in the appearance of hepatic metastatic disease. > Diffuse osseous metastatic disease without evidence of acute complicating feature. CT chest (08/01/20)  Moderate-sized pleural effusions, right larger than left appearing very similar to prior  study. > Bibasilar areas of atelectasis. No new pulmonary parenchymal abnormality demonstrated. Imaging stigmata compatible with known metastatic breast cancer to include partial inclusion of known hepatic metastasis as well as osseous metastatic disease without  superimposed acute complicating feature. CXR (08/02/20) No evidence of pneumothorax following right thoracentesis. Persistent small left effusion.  ac. ED to hospital admission (05/09/21 [facility]) in [place] for shortness of breath. CXR - Increased interstitial pulmonary markings at the lower lung fields with associated small pleural effusions, suggestive of fluid overload/mild dependent edema. CTA chest - No pulmonary embolus or aortic dissection. Large bilateral pleural effusions with associated compressive atelectasis. Small amount of ascites in the visualized upper abdomen. Cirrhotic appearing liver. Diffuse skeletal metastatic disease. She underwent US guided thoracentesis while admitted with a total of 600 ml of red colored fluid removal. No post-procedure pneumothorax on sonogram.    Social History:  Lives in a motor home.  Building a home in [place] was obtained and pertinent findings reviewed above. Past medical history, social history, family history, medications, and allergies are located in the electronic medical record.    Physical Exam:   BP 119/80   Pulse (!) 105   Temp 97.8 F (36.6 C) (Oral)   Resp 17   Ht '5\' 5"'$  (1.651 m)   Wt 140 lb 6.4 oz (63.7 kg)   SpO2 95%   BMI 23.36 kg/m   ECOG PS: 3  General: no distress, confused  but answers some questions appropriately   Eyes: anicteric sclerae  HENT: oropharynx clear  Neck: supple  Respiratory: normal respiratory effort, clear in anterior fields  CV: tachycardic, no peripheral edema  Ext: warm, well perfused, calf asymmetry (L>R)  GI: soft, nontender, nondistended, no masses  Skin: no rashes, no ecchymoses, no petechiae    Results:     Lab Results   Component Value Date/Time    WBC 25.7 11/21/2021 12:56 AM    HGB 12.9 11/21/2021 12:56 AM    HCT 41.1 11/21/2021 12:56 AM    PLT 232 11/21/2021 12:56 AM    MCV 102.8 11/21/2021 12:56 AM     Lab Results   Component Value Date/Time    NA 136 11/21/2021 12:56 AM    K 3.8 11/21/2021 12:56 AM    CL 96 11/21/2021 12:56 AM    CO2 29 11/21/2021 12:56 AM    BUN 27 11/21/2021 12:56 AM     Lab Results   Component Value Date/Time    ALT 51 11/21/2021 12:56 AM    GLOB 3.2 11/21/2021 12:56 AM     Records from Epic reviewed and summarized above.  Test results above have been reviewed.    Imaging:     CT Result (most recent):  CT ABDOMEN PELVIS W IV CONTRAST 11/20/2021    Narrative  EXAM: CT ABDOMEN PELVIS W IV CONTRAST    INDICATION: abd pain w/ AMS and N/V    COMPARISON: 10/07/2021    CONTRAST: 100 mL of Isovue-370.    ORAL CONTRAST: None    TECHNIQUE:  Following the intravenous administration of intravenous contrast, axial images  were obtained through the abdomen and pelvis. Coronal and sagittal  reconstructions were generated. CT dose reduction was achieved through use of a  standardized protocol tailored for this examination and automatic exposure  control for dose modulation.    FINDINGS:  There are moderate-sized bilateral pleural  effusions with adjacent compressive  atelectasis.    A 2.4 cm cyst is noted in the right hepatic lobe.  There are multiple masses in  the liver are again visualized compatible with diffuse metastatic disease.  One  of the largest is located in the posterior right hepatic lobe measures up to 2.5  cm (series 3 image number 46).    The spleen enhances appropriately.   The  pancreas enhances homogeneously. No discrete masses.  The gallbladder is  surgically absent.  No significant biliary dilatation.    The adrenal glands appear normal.    The kidneys enhance symmetrically. No hydronephrosis.    Visualized portions of the bladder are grossly unremarkable.  Pelvic structures  are not well visualized because of metallic streak artifact from bilateral hip  arthritis hardware.    The uterus is present.    There is a small volume of ascites in the abdomen.  There is no free air.  There  are no abscesses.    No significant lymphadenopathy.    No evidence of intestinal obstruction.  The appendix appears normal.    Aorta is normal in caliber.  There is apparent extensive filling defect from the left femoral vein to the  very inferior aspect of the inferior vena cava compatible with large DVT.    Diffuse skeletal metastatic disease is again noted.    Impression  1.  Large filling defect extending from the left femoral vein to the inferior  portion of the inferior vena cava concerning for deep venous thrombosis.    2.  Multiple hepatic masses are again noted compatible with hepatic metastatic  disease.    3.  Diffuse skeletal metastases are again visualized.    4.  Small volume of ascites.      The findings were conveyed to [facility]  via perfectserve at 11/20/2021 5:54 PM  by Denna Haggard.  [place]      Imaging personally reviewed by me    Assessment:   1) Metastatic lobular breast cancer (ER+/PR+)  Follows with Dr. Stephens November at [facility].  Sites of disease include diffuse bone mets, liver mets, and malignant pleural effusion. Currently on oral capecitabine 500 mg BID one week on, one week off.  This has been on hold since early-mid August  - Hold capecitabine while inpatient  - Palliative Care to see    #) Hypercalcemia of malignancy:  Presented with calcium 12.3, corrected 13.5   - Status post calcitonin 4 units/kg x doses.  Continue  calcitonin BID  - Lasix per medicine  - Zoledronic acid x 1 dose  - Nephrology following    #) Metabolic encephalopathy: due to hypercalcemia and UTI    #) Extensive acute left proximal and distal vein DVT:  Incidentally noted on CT A/P with filling defect from L femoral vein extending to IVC.  Follow up Doppler with extensive DVT throughout L external ilac to femoral vein and subacute occlusive thrombus in popliteal and posterior tibial veins.  Risk factors: a) metastatic breast cancer  - Vascular to see    - Currently on heparin drip.  Continue for now.  Once stable, would be reasonable to transition to DOAC (e.g. Eliquis) with load.  Recommend lifelong AC    #) Acute on chronic heart failure: Lasix per medicine    #) Acute cystitis: antibiotics per medicine    #) Orthostatic hypotension: management per medicine    #) Adrenal insufficiency: on chronic prednisone 10 mg daily  Oncology will follow along as needed. Thank you for involving me in the care of this patient.    Signed By: Kristopher Oppenheim, MD

## 2021-11-21 NOTE — Progress Notes (Signed)
Palliative Medicine      Code Status: Full Code    Advance Care Planning:    No AMD on file, spouse is LNOK    Patient / Family Encounter Documentation    Participants (names): Dr. Gorden Harms, husband [name]     Narrative: The Palliative Medicine SW and Dr. Gorden Harms met with the patient (sleeping during our encounter- did not awake), and her husband [name] at bedside in order to introduce the role of Palliative Medicine and provide additional support.     Mr. Cegielski tell us that the patient has been declining in the past 6 months- he describes that she has had numerous hospitalizations over the years, and in February she was hospitalized in [place], and has had difficulty bouncing back since this hospitalization- prompting them to sell their home in [place], [place] and move to [place] to be closer to one of their daughters.     The patient's husband understands that she has "stage IV- cancer everywhere" and that she has survived much longer than expected with her heart failure as well as cancer. He reflects that the patient was given 5 years- and that was over 15 years ago.     Mr. Barrette shares that the patient has been more confused, not eating, "seeing things that aren't there," and sleeping more during the day. We discuss her issues with her calcium, and hope that some of her confusion will be improved once her calcium improved, however, we also discuss worries that this may be a temporary fix, but overall she has extensive disease. We discuss that high calcium is an indicator that the cancer is very advanced and if Oncology doesn't have other treatments to offer, she will likely have additional/ongoing decline.     Mr. Baker is hoping that the patient will have improvements, plan is to see how she does in upcoming days. We offered support and assistance in navigating next steps as well as how to best support and take care of her. Mr. Genco shares that their in-laws suite on their daughter's home is almost  complete, and he is hopeful it will be easier caring for her in the suite compared to their motor home- the suite will be wheelchair accessible. Right now he plans on driving back to [facility] to see Dr. Elinor Parkinson w/ Oncology for ongoing follow-up and treatments.     We gain additional psychosocial history (see below). The patient is described as a "go getter/goer" someone that is always wanting to hit the road, travel, and "go do." She has a paralegal degree, and worked as a Radio broadcast assistant as well as for [employer].     Introduction to our team today, we will continue to follow along with you.     Psychosocial Issues Identified/ Resilience Factors: The patient and his wife recently moved to [place], [place] from [facility]) area- the patient and her husband sold their home in [place] to [place] with their daughter Colletta Quitman (currently living in motor home on daughter's property while their in-laws suite is being built onto their daughter's home). The patient and her husband have been married 40 years- they have three children, Colletta [place] (PhD in Neuroscience), Lenna Sciara (Is a family physician in [place]), son [name] in [place] (works as an Heritage manager). The patient has 6 grandchildren. The patient and her husband have been to all 23 states- they love traveling and spend their winters in [place], [place]. The patient enjoys her grandchildren, cruises, and going across country to  museums and parks. [name] shares that the patient loves the [facility].     Caregiver Burden:   Does the caregiver feel confident administering medication? No concerns verbalized   Does the caregiver need any help connecting with community resources? Monitor  Does the caregiver feel confident assisting with activities of daily living? Monitor- may need additional resources, husband shares that it was becoming increasingly difficult     Goals of Care / Plan: Full Code, continue current care     Thank you for including  Palliative Medicine in the care of [place], [place]

## 2021-11-21 NOTE — Progress Notes (Addendum)
Received report for pt at 1230. Offgoing pulled coagulation lab

## 2021-11-21 NOTE — Progress Notes (Signed)
Physical therapy:    Orders received and chart reviewed. Pt with acute L femoral vein DVT and heparin-XA is outside of parameters to mobilize. Will defer and continue to follow. Thank you.    Shea Evans, PT, DPT

## 2021-11-21 NOTE — Consults (Addendum)
Palliative Medicine Consult  [place]: [phone])    Patient Name: Ariel Day  Date of Birth: [birth year]    Date of Initial Consult: 11/21/2021  Date of Today's Visit: 11/21/2021  Reason for Consult: care decisions  Requesting Provider: Dr. Dairl Ponder      SUMMARY:   Erlean Mealor is a 76 y.o. with a past history of metastatic breast cancer (dx 2017 w bone mets), long hx of treatments followed by Dr. Stephens November at [facility] (current tx capecitabine), hx liver/ bone/ malignant pleural effusion [facility] 10/07/2021 temporary pleural drain), Hx chronic diastolic CHF s/p ICD 5427 EF 10% in past, improved to 65% in 2022 , adrenal insufficiency, orthostatic hypotension , who was admitted on 11/20/2021 from home with a diagnosis of weakness, fatigue, no longer able to walk, AMS.     Current medical issues leading to Palliative Medicine involvement include: patient being treated for hypercalcemia of malignancy (corrected calcium 13.5 on admission)  UTI and also acute LLE DVT extending into Inferior Vena Cava  CT Abd: 11/20/2021   1.  Large filling defect extending from the left femoral vein to the inferior  portion of the inferior vena cava concerning for deep venous thrombosis.  2.  Multiple hepatic masses are again noted compatible with hepatic metastatic  disease   3.  Diffuse skeletal metastases are again visualized  4.  Small volume of ascites.    Social: married to spouse Eliany Mccarter for over 91 years.  They have 3 adult children (Dtr, [place], local, dtr [place], in [place] (family practice MD) and son, [name] in [place])  6 grandchildren in [place].   Recent move from [facility] to [place] to live near dtr. Currently in mobile home while inlaw suite under construction (ready this week they hope)  Patient and spouse have traveled extensively in the last 15 years, going to all 10 states in motor home.  They have spent many winters in [place], [place].  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  Spouse notes that  patient has had ongoing functional decline in past 6 months, needing more assistance with ADL care       PALLIATIVE DIAGNOSES:   Metabolic encephalopathy  Hypercalcemia of malignancy, breast cancer  Acute cystitis  LLE DVT extending into IVC  Pleural effusions, bilateral  Diffuse skeletal metastatic disease  Ascites  Functional decline, weakness, cancer related fatigue  Poor appetite, weight loss   Palliative medicine encounter       PLAN:   Discussed with attending MD.  Nephrology and Oncology notes reviewed.  Vascular input pending.   Palliative Medicine services introduced to spouse Demaya Hardge at bedside, as added layer of support in chronic illness  Mr. Dimario tells [place] she continues to be confused , "talking about things that aren't there"  Hear of long journey of medical issues.  She survived much longer than expected from both heart failure  ("she was given 5 years") and has long hx of treatments for breast cancer.  Mr Cayton shares that she has really declined in the last 6 months.  Last trip to [facility] was difficult.  She is needing much more assistance with ADLs.  He understands plans to try to correct calcium, which will hopefully improve her confusion.  Education provided that the high calcium is a sign that the cancer is very advanced and if oncology doesn't have new things to offer to treat the cancer, it is likely calcium will continue to cause issues/  decline.   We discussed seeing how she does, what gains she can make with attempts to treat infection, high calcium.  Suggest it may be important to talk about next steps, how best to support them when she returns home (to her dtr's house)  Initial consult note routed to primary continuity provider and/or primary health care team members  Communicated plan of care with: Palliative IDT, [place] Team    ADVANCE CARE PLANNING / TREATMENT PREFERENCES:     GOALS OF CARE:  '[]'$ -Comfort   '[]'$ -Cure   '[]'$ -Prolong life   '[x]'$ -Recovery from acute illness    '[]'$ -Rehabilitation  '[x]'$ -Other:         TREATMENT PREFERENCES:     Patient and family's personal goals include: pt unable to state    Important upcoming milestones or family events: they have been adding to dtr's house, hoping to move in this week (currently in their mobile home in the driveway)     The patient identifies the following as important for living well: pt enjoys traveling, being on the go all the time, spending time with family    Advance Care Planning:  '[x]'$  The Pall Med Interdisciplinary Team has updated the ACP Navigator with [place] and Patient [place]     The patient has appointed the following active healthcare agents:      The Patient has the following current code status:    Code Status: Full Code         Other:    As far as possible, the palliative care team has discussed with patient / health care proxy about goals of care / treatment preferences for patient.     HISTORY:     History obtained from: chart, spouse    CHIEF COMPLAINT: weakness, confusion    HPI/SUBJECTIVE:    The patient is:   '[]'$  Verbal and participatory  '[x]'$  Non-participatory due to: delirium    76 year old female with known breast cancer   Admitted with weakness, confusion   Decline in functional status       ROS / FUNCTIONAL ASSESSMENT:     Palliative Performance Scale (PPS)  PPS: 30         Modified-[place] Symptom Assessment Scale (ESAS)  Pain Score: No pain  Dyspnea Score: No shortness of breath    Clinical Pain Assessment  Severity: 0               PSYCHOSOCIAL/SPIRITUAL SCREENING:     Palliative IDT has assessed this patient for cultural preferences / practices and a referral made as appropriate to needs Orthoptist, Patient Advocacy, Ethics, etc.)    Spiritual affiliation was reviewed as documented by palliative care chaplain.      Any spiritual / religious / cultural beliefs and practices that will affect your patient's care?:  '[]'$  Yes /  '[x]'$  No   If "Yes" to discuss  with pastoral care during IDT     Does caregiver feel burdened by caring for their loved one:   '[]'$  Yes /  '[x]'$  No /  '[]'$  No Caregiver Present/Available '[]'$  No Caregiver '[]'$  Pt Lives at Facility  If "Yes" to discuss with social work during IDT    Anticipatory grief assessment:   '[x]'$  Normal  / '[]'$  Maladaptive     If "Maladaptive" to discuss with social work during IDT     PHYSICAL EXAM:     From Museum/gallery curator:  [employer]  from Last 3 Encounters:   11/20/21 63.7 kg (140 lb 6.4 oz)   10/08/21 64.9 kg (143 lb)     Blood pressure 126/81, pulse 100, temperature 97.4 F (36.3 C), temperature source Oral, resp. rate 18, height 1.651 m ('5\' 5"'$ ), weight 63.7 kg (140 lb 6.4 oz), SpO2 95 %.    Last bowel movement, if known:     Constitutional: pale, chronically ill appearing, sleeping NAD     HISTORY:     Principal Problem:    AMS (altered mental status)  Active Problems:    Acute encephalopathy    Carcinoma of breast metastatic to bone (HCC)    Complicated UTI (urinary tract infection)    Hypercalcemia    Acute deep vein thrombosis (DVT) of femoral vein of left lower extremity (HCC)  Resolved Problems:    * No resolved hospital problems. *    Past Medical History:   Diagnosis Date    Breast cancer ([place])     Cardiomyopathy ([place])     EF10%; 65% 2022    Malignant pleural effusion     Orthostatic hypotension       Past Surgical History:   Procedure Laterality Date    CARDIAC DEFIBRILLATOR PLACEMENT Left 2012    CHOLECYSTECTOMY      JOINT REPLACEMENT Bilateral     MASTECTOMY, BILATERAL        Family History   Problem Relation Age of Onset    Heart Disease Mother     Breast Cancer Mother     Heart Disease Father       History reviewed, no pertinent family history.  Social History     Tobacco Use    Smoking status: Never    Smokeless tobacco: Not on file   Substance Use Topics    Alcohol use: Not Currently     No Known Allergies   Current Facility-Administered Medications   Medication Dose Route Frequency    zoledronic acid (ZOMETA) 4  mg/100 mL infusion  4 mg IntraVENous Once    calcitonin (MIACALCIN) injection 254 Units  4 Units/kg SubCUTAneous BID    midodrine (PROAMATINE) tablet 2.5 mg  2.5 mg Oral BID    sodium chloride flush 0.9 % injection 5-40 mL  5-40 mL IntraVENous 2 times per day    sodium chloride flush 0.9 % injection 5-40 mL  5-40 mL IntraVENous PRN    0.9 % sodium chloride infusion   IntraVENous PRN    ondansetron (ZOFRAN-ODT) disintegrating tablet 4 mg  4 mg Oral Q8H PRN    Or    ondansetron (ZOFRAN) injection 4 mg  4 mg IntraVENous Q6H PRN    polyethylene glycol (GLYCOLAX) packet 17 g  17 g Oral Daily PRN    acetaminophen (TYLENOL) tablet 650 mg  650 mg Oral Q6H PRN    Or    acetaminophen (TYLENOL) suppository 650 mg  650 mg Rectal Q6H PRN    predniSONE (DELTASONE) tablet 10 mg  10 mg Oral Daily    ceFEPIme (MAXIPIME) 2,000 mg in sodium chloride 0.9 % 50 mL IVPB (mini-bag)  2,000 mg IntraVENous Q24H    heparin (porcine) injection 5,700 Units  80 Units/kg IntraVENous PRN    heparin (porcine) injection 2,850 Units  40 Units/kg IntraVENous PRN    heparin 25,000 units in dextrose 5% 250 mL (premix) infusion  18-30 Units/kg/hr IntraVENous Continuous          LAB AND IMAGING FINDINGS:     Lab Results  Component Value Date/Time    WBC 25.7 11/21/2021 12:56 AM    HGB 12.9 11/21/2021 12:56 AM    PLT 232 11/21/2021 12:56 AM     Lab Results   Component Value Date/Time    NA 136 11/21/2021 12:56 AM    K 3.8 11/21/2021 12:56 AM    CL 96 11/21/2021 12:56 AM    CO2 29 11/21/2021 12:56 AM    BUN 27 11/21/2021 12:56 AM    MG 2.1 11/20/2021 12:12 PM    PHOS 2.4 11/20/2021 01:53 PM      Lab Results   Component Value Date/Time    GLOB 3.2 11/21/2021 12:56 AM     Lab Results   Component Value Date/Time    INR 1.2 11/20/2021 07:18 PM    APTT 25.1 11/20/2021 07:18 PM      No results found for: IRON, TIBC, IBCT, FERR   No results found for: PH, PCO2, PO2  No components found for: GLPOC   No results found for: CPK, CKMB, TROPONINI           Only check if  applicable and billing time based rather than MDM    '[x]'$    The total encounter time on this service date was 55 minutes which was spent performing a face-to-face encounter and personally completing the provider-level activities documented in the note.  This includes time spent prior to the visit and after the visit in direct care of the patient.  This time does not include time spent in any separately reportable services.

## 2021-11-22 ENCOUNTER — Inpatient Hospital Stay: Admit: 2021-11-22 | Payer: MEDICARE | Primary: Family Medicine

## 2021-11-22 LAB — CBC
Hematocrit: 41 % (ref 35.0–47.0)
Hemoglobin: 12.9 g/dL (ref 11.5–16.0)
MCH: 32.7 PG (ref 26.0–34.0)
MCHC: 31.5 g/dL (ref 30.0–36.5)
MCV: 104.1 FL — ABNORMAL HIGH (ref 80.0–99.0)
MPV: 10.7 FL (ref 8.9–12.9)
Nucleated RBCs: 0 PER 100 WBC
Platelets: 244 10*3/uL (ref 150–400)
RBC: 3.94 M/uL (ref 3.80–5.20)
RDW: 18.3 % — ABNORMAL HIGH (ref 11.5–14.5)
WBC: 25.8 10*3/uL — ABNORMAL HIGH (ref 3.6–11.0)
nRBC: 0 10*3/uL (ref 0.00–0.01)

## 2021-11-22 LAB — COMPREHENSIVE METABOLIC PANEL
ALT: 61 U/L (ref 12–78)
AST: 72 U/L — ABNORMAL HIGH (ref 15–37)
Albumin/Globulin Ratio: 0.7 — ABNORMAL LOW (ref 1.1–2.2)
Albumin: 2.6 g/dL — ABNORMAL LOW (ref 3.5–5.0)
Alk Phosphatase: 287 U/L — ABNORMAL HIGH (ref 45–117)
Anion Gap: 9 mmol/L (ref 5–15)
BUN: 29 MG/DL — ABNORMAL HIGH (ref 6–20)
Bun/Cre Ratio: 26 — ABNORMAL HIGH (ref 12–20)
CO2: 34 mmol/L — ABNORMAL HIGH (ref 21–32)
Calcium: 10.6 MG/DL — ABNORMAL HIGH (ref 8.5–10.1)
Chloride: 94 mmol/L — ABNORMAL LOW (ref 97–108)
Creatinine: 1.1 MG/DL — ABNORMAL HIGH (ref 0.55–1.02)
Est, Glom Filt Rate: 53 mL/min/{1.73_m2} — ABNORMAL LOW (ref >60)
Globulin: 3.6 g/dL (ref 2.0–4.0)
Glucose: 98 mg/dL (ref 65–100)
Potassium: 3.4 mmol/L — ABNORMAL LOW (ref 3.5–5.1)
Sodium: 137 mmol/L (ref 136–145)
Total Bilirubin: 1.3 MG/DL — ABNORMAL HIGH (ref 0.2–1.0)
Total Protein: 6.2 g/dL — ABNORMAL LOW (ref 6.4–8.2)

## 2021-11-22 LAB — CULTURE, BLOOD 1

## 2021-11-22 LAB — HEPARIN, ANTI-XA
Heparin Xa,LMWH and Unfrac: 0.45 IU/mL
Heparin Xa,LMWH and Unfrac: 0.53 IU/mL
Heparin Xa,LMWH and Unfrac: 0.88 IU/mL
Heparin Xa,LMWH and Unfrac: 1.48 IU/mL

## 2021-11-22 LAB — PHOSPHORUS: Phosphorus: 2.5 MG/DL — ABNORMAL LOW (ref 2.6–4.7)

## 2021-11-22 LAB — MAGNESIUM: Magnesium: 1.8 mg/dL (ref 1.6–2.4)

## 2021-11-22 MED ORDER — POTASSIUM CHLORIDE 10 MEQ/100ML IV SOLN
10 MEQ/0ML | INTRAVENOUS | Status: AC
Start: 2021-11-22 — End: 2021-11-22
  Administered 2021-11-22 (×3): 10 meq via INTRAVENOUS

## 2021-11-22 MED FILL — ONDANSETRON HCL 4 MG/2ML IJ SOLN: 4 MG/2ML | INTRAMUSCULAR | Qty: 2

## 2021-11-22 MED FILL — CEFEPIME HCL 2 G IV SOLR: 2 g | INTRAVENOUS | Qty: 2

## 2021-11-22 MED FILL — MIDODRINE HCL 5 MG PO TABS: 5 MG | ORAL | Qty: 1

## 2021-11-22 MED FILL — MIACALCIN 200 UNIT/ML IJ SOLN: 200 UNIT/ML | INTRAMUSCULAR | Qty: 1.27

## 2021-11-22 MED FILL — POTASSIUM CHLORIDE 10 MEQ/100ML IV SOLN: 10 MEQ/0ML | INTRAVENOUS | Qty: 100

## 2021-11-22 MED FILL — PREDNISONE 10 MG PO TABS: 10 MG | ORAL | Qty: 1

## 2021-11-22 NOTE — Other (Signed)
HEART FAILURE NURSE NAVIGATOR NOTE  [name]    Patient chart was reviewed by Heart Failure (HF) Nurse Navigators for compliance of prescribed treatment with guidelines directed medical therapy (GDMT) and HF database completed.     Please, review beneath recommendations for symptomatic patients with HF with Preserved Ejection Fraction ? 50% (HFpEF) for your consideration when taking care of this patient.    Current HF Medical Therapy:      Name Ariel Day   DOB Aug 09, 1945   LVEF 65/70%   NYHA Functional Class    ARNi/ACEi/ARB    Aldosterone Antagonist    SGLT2 inhibitor    Consulting Cardiologist Not consulted     Recommendations for HF Management:    For patients with HFpEF ? 50%, consider adding the following GDMT, if appropriate:  SGLT2 inhibitor [Class 2a]  ARNi or ARB [Class 2b]  Aldosterone antagonist [Class 2b]  Adjust antihypertensive therapy [Class 1]  Adjust diuretic dose at discharge if hospitalized for volume overload [Class 1]  For patient with hyperkalemia while on RAASi > 5.5, consider adding potassium binders (patiromer, sodium zirconium cycosilicate) [Class 2a]    Patients with suspected cardiac amyloidosis (older > 61 years old with LVH > 1.2cm and/or any other signs of amyloidosis) should be offered screening labs and imaging [Class 1]: (a) serum gammopathy profile and UPEP with immunofixation, and (b) PYP test. If PYP test is positive patient should have genetic testing done for inherited ATTR amyloidosis. If any findings are positive or you need genetic testing ordered, please, consider in-patient consultation or referral to [place].      Note that the following medications may be potentially harmful in heart failure [Class 3].  Calcium channel blockers (doxazosin, diltiazem, verapamil, nifedipine)  Antiarrhythmics (flecanide, disopyrimide, sotalol, dronedarone)  Diabetes medications (thiasolidinediones, saxagliptin,  alogliptin)  NSAIDs and COX 2 inhibitors    Consider vaccinations for respiratory illnesses (flu, pneumonia, covid) [Class 2b]    Due to h/o recurrent hospitalizations this patient may benefit from referral to Advanced Heart Failure Program to assess suitability for advanced therapies, such as left-ventricular assist device, heart transplantation, palliative inotropes or palliation [Class 1]. To obtain in-patient consultation or refer to [place], call [phone]    Patient Heart Failure Education:     Teach back in heart failure education provided, including information about medical therapy, lifestyle modifications, diet and fluid restrictions, physical activity.  Educational resources provided: "Living with Heart Failure" booklet; Signs/Symptoms magnet; Weight Calendar; Dis[facility] flyer; Preparing for Successful Discharge check list.  Information provided about HF support group.  Learning about self-care for Heart Failure on discharge instructions.      Plan for Transitional Care:    Post discharge follow up phone call to be made within 48-72 hours of discharge.  Patient will follow-up with PCP.  Patient will follow-up with Primary Cardiologist.  Patient screened for obstructive sleep apnea and referred to Sleep Medicine.  Referral/follow-up with Cardiac Rehabilitation.  Referral/follow-up with Advanced Heart Failure Specialist.  Referral/follow-up with Palliative Care Specialist.        Heart Failure Nurse Navigator  Phone: [phone] / [phone]    *Recommendations listed above are based on the guidelines: 2022 AHA/ACC/HFSA Guideline for the Management of Heart Failure: A Report of the [place] on Clinical Practice Guidelines. Circulation 2022; [id number]. and 2017 AHA/ACC/HRS guideline for management of patients with ventricular arrhythmias and the prevention  of sudden cardiac death: A Report of the  [employer] of Cardiology/American Heart Association Task Force on Clinical Practice Guidelines and the Heart Rhythm Society. Heart Rhythm, Vol 15, No 10, October 2018 *Class of Recommendation: Class 1 (strong), Class 2a (moderate), Class 2b (weak), Class 3 (not recommended, potentially harmful)

## 2021-11-22 NOTE — Plan of Care (Signed)
Problem: Discharge Planning  Goal: Discharge to home or other facility with appropriate resources  Outcome: Progressing  Flowsheets (Taken 11/22/2021 0810)  Discharge to home or other facility with appropriate resources: Identify barriers to discharge with patient and caregiver     Problem: Safety - Adult  Goal: Free from fall injury  Outcome: Progressing     Problem: Pain  Goal: Verbalizes/displays adequate comfort level or baseline comfort level  Recent Flowsheet Documentation  Taken 11/21/2021 2356 by Irena Reichmann, RN  Verbalizes/displays adequate comfort level or baseline comfort level: Encourage patient to monitor pain and request assistance

## 2021-11-22 NOTE — Plan of Care (Signed)
Problem: Occupational Therapy - Adult  Goal: By Discharge: Performs self-care activities at highest level of function for planned discharge setting.  See evaluation for individualized goals.  Description: FUNCTIONAL STATUS PRIOR TO ADMISSION:  Patient unable to provide history at evaluation.  Per her husband, she has had a gradual progressive decline for about a year, increasing significantly for a few weeks prior to admission.  She has required assistance for all ADLs, although still contributing, and her mobility has declined from ambulating with RW to primarily using a wheelchair.    HOME SUPPORT: Patient resided with her husband to provide assistance.   They have been building an accessible addition to live at their daughter's house and have been staying in a motor home/ RV in the meantime.    Occupational Therapy Goals:  Initiated 11/22/2021  1.  Patient will perform grooming with Minimal Assist within 7 day(s).  2.  Patient will perform self-feeding with Minimal Assist within 7 day(s).  3.  Patient will perform bathing with Moderate Assist within 7 day(s).  4.  Patient will perform toilet transfers with Minimal Assist  within 7 day(s).  5.  Patient will perform all aspects of toileting with Moderate Assist within 7 day(s).  6.  Patient will perform UB dressing with minimal assist within 7 days.  7.  Patient will perform LB dressing with moderate assist within 7 days.  8.  Patient will participate in upper extremity therapeutic exercise/activities with Supervision for 10 minutes within 7 day(s).      Outcome: Progressing     OCCUPATIONAL THERAPY EVALUATION    Patient: Ariel Day (76 y.o. female)  Date: 11/22/2021  Primary Diagnosis: Hypercalcemia [E83.52]  Abnormal LFTs [R79.89]  Acute encephalopathy [G25.42]  Complicated UTI (urinary tract infection) [N39.0]  Carcinoma of breast metastatic to bone, unspecified laterality ([place]) [C50.919, C79.51]  AMS (altered mental status) [R41.82]          Precautions:fall    ASSESSMENT :  Note patient with history of heart failure (EF 10%) as well as breast cancer with liver and bone metastases admitted for AMS/ general weakness/ mobility decline. Per her husband, she has had a gradual progressive decline for about a year, increasing significantly for a few weeks prior to admission.  She has required assistance for all ADLs, although still contributing, and her mobility has declined from ambulating with RW to primarily using a wheelchair.  Note patient was also found to have an extensive LLE DVT, is on heparin gtt (anti Xa 0.88), and cleared to mobilize today by hospitalist.    Patient presents for OT evaluation A&Ox2 with decreased command following, drowsiness, limited activity tolerance, general weakness, impaired balance, impaired coordination, impaired reach for ADLs, and limited endurance.  She was positive for orthostatic hypotension sitting EOB (see vitals chart below), symptomatic with dizziness/ pallor/ nausea, but improving with seated leg exercises.  She required modA for self-feeding, maxA for UB dressing, modA for supine<>sit, and modA for sit<>stand.      Patient and her husband have been having an accessible addition built to live at their daughter's house, which her husband reports should be completed soon.  In the meantime, they have been staying in a motor home/ RV, which her husband appropriately acknowledged will not be a practical option at d/c d/t confined space and stairs to enter/ exit.  Recommend HHOT at d/c with continued assistance IF patient's family can make alternate accommodations while the addition is still being built.  Ideally she would have no  stairs to navigate and enough space for a BSC and to maneuver a wheelchair.  Otherwise recommend SNF.     Functional Outcome Measure:  The patient scored 74% on the AM-PAC ADL outcome measure which is indicative of severe functional impairment.         PLAN :  Recommendations and Planned  Interventions:   self care training, therapeutic activities, functional mobility training, balance training, therapeutic exercise, endurance activities, patient education, home safety training, and family training/education    Frequency/Duration: OT Plan of Care: 3 times/week    Recommendation for discharge: (in order for the patient to meet his/her long term goals):  Recommend HHOT at d/c with continued assistance IF patient's family can make alternate accommodations while the addition is still being built.  Ideally she would have no stairs to navigate and enough space for a wheelchair and BSC.  Otherwise recommend SNF.       SUBJECTIVE:   Patient stated, "I'm dizzy."    OBJECTIVE DATA SUMMARY:     Past Medical History:   Diagnosis Date    Breast cancer ([place])     Cardiomyopathy ([place])     EF10%; 65% 2022    Malignant pleural effusion     Orthostatic hypotension      Past Surgical History:   Procedure Laterality Date    CARDIAC DEFIBRILLATOR PLACEMENT Left 2012    CHOLECYSTECTOMY      JOINT REPLACEMENT Bilateral     MASTECTOMY, BILATERAL            Expanded or extensive additional review of patient history:   Lives With: Spouse (addition being completed soon, only other option is a motor home/ RV with steps to enter)  Type of Home: House  Home Layout: Able to Live on Main level with bedroom/bathroom  Home Access: Ramped entrance              Bathroom Equipment: 3-in-1 commode     Home Equipment: Wheelchair-manual, Rollator            EXAMINATION OF PERFORMANCE DEFICITS:    Cognitive/Behavioral Status:  Orientation  Overall Orientation Status: Impaired  Orientation Level: Oriented to place;Oriented to person;Disoriented to time;Disoriented to situation  Cognition  Overall Cognitive Status: Exceptions  Arousal/Alertness: Delayed responses to stimuli  Following Commands: Follows one step commands with repetition  Memory: Decreased short term memory  Insights: Decreased awareness of deficits  Initiation: Requires cues  for all  Sequencing: Requires cues for all      Vision/Perceptual:          Vision  Vision: Within Functional Limits  Perception  Overall Perceptual Status: WFL    Range of Motion:   AROM: Generally decreased, functional         Strength:  Strength: Generally decreased, functional      Coordination:  Coordination: Generally decreased, functional                Functional Mobility and Transfers for ADLs:    Bed Mobility:     Bed Mobility Training  Bed Mobility Training: Yes  Rolling: Moderate assistance  Supine to Sit: Moderate assistance    Transfers:     Pharmacologist: Yes  Sit to Stand: Moderate assistance  Stand to Sit: Moderate assistance            Balance:   Sitting: Impaired (static fair, dynamic fair)  Standing: Impaired (static poor, dynamic poor)  ADL Assessment:          Feeding: Moderate assistance  Feeding Skilled Clinical Factors: required initial assistance raising water jug and coordinating straw to mouth.  then was able to raise using BUE and successfully coordinated straw without assistance.  infer modA overall    Grooming: Moderate assistance  Grooming Skilled Clinical Factors: inferred for endurance, coordination    UE Bathing: Maximum assistance  UE Bathing Skilled Clinical Factors: inferred for endurance, coordination, reach    LE Bathing: Dependent/Total  LE Bathing Skilled Clinical Factors: inferred    UE Dressing: Maximum assistance  UE Dressing Skilled Clinical Factors: for gown change and inferred overall    LE Dressing: Dependent/Total  LE Dressing Skilled Clinical Factors: inferred    Toileting: Dependent/Total  Toileting Skilled Clinical Factors: inferred           ADL Intervention and task modifications:    KB Home	[place] AM-PACTM "6 Clicks"                                                       Daily Activity Inpatient Short Form  AM-PAC Daily Activity - Inpatient   How much help is needed for putting on and taking off regular lower body clothing?:  Total  How much help is needed for bathing (which includes washing, rinsing, drying)?: A Lot  How much help is needed for toileting (which includes using toilet, bedpan, or urinal)?: Total  How much help is needed for putting on and taking off regular upper body clothing?: A Lot  How much help is needed for taking care of personal grooming?: A Lot  How much help for eating meals?: A Lot  AM-PAC Inpatient Daily Activity Raw Score: 10  AM-PAC Inpatient ADL T-Scale Score : 27.31  ADL Inpatient CMS 0-100% Score: 74.7  ADL Inpatient CMS G-Code Modifier : CL     Interpretation of Tool:  Represents clinically-significant functional categories (i.e. Activities of daily living).  Cutoff score 39.4 (19) correlates to a good likelihood of discharging home versus a facility  Dyer, Jon Gills, Jeanella Flattery, Mena Goes. Passek, Roslynn Amble. Annabell Howells, AM-PAC "6-Clicks" Functional Assessment Scores Predict [facility] Discharge Destination, Physical Therapy, Volume 94, Issue 9, 25 November 2012, Pages [phone], [email or web address]       Pain Rating:  Patient did not report pain    Activity Tolerance:   signs and symptoms of orthostatic hypotension       11/22/21 0930 11/22/21 0945 11/22/21 0948   Vital Signs   Pulse (!) 111 (!) 128 (!) 133   Heart Rate Source Monitor Monitor Monitor   BP 115/72 (!) 83/56 105/74   MAP (Calculated) 86 65 84   BP Location Left upper arm Left upper arm Left upper arm   Patient Position Supine Sitting Sitting  (after seated leg exercises)      11/22/21 0956   Vital Signs   Pulse (!) 125   Heart Rate Source Monitor   BP 107/75   MAP (Calculated) 86   BP Location Left upper arm   Patient Position Semi fowlers  (at rest)       After treatment:   Patient left in no apparent distress in bed, Call bell within reach, Bed/ chair alarm activated, and Caregiver / family present  COMMUNICATION/EDUCATION:   The patient's plan of care was discussed with:  physical therapist and registered nurse         Thank you for this referral.  Bailey Mech, OT  Minutes: 1    Occupational Therapy Evaluation Charge Determination   History Examination Decision-Making   LOW Complexity : Brief history review  MEDIUM Complexity: 3-5 Performance deficits relating to physical, cognitive, or psychosocial skills that result in activity limitations and/or participation restrictions MEDIUM Complexity: Patient may present with comorbidities that affect occupational performance. Minimal to moderate modifications of tasks or assist (eg. physical or verbal) with assist is necessary to enable pt to complete eval   Based on the above components, the patient evaluation is determined to be of the following complexity level: Low

## 2021-11-22 NOTE — Progress Notes (Addendum)
[place] [phone] (COPE)  Bonna Gains [phone] (COPE)      GOALS OF CARE: Continue current care and optimization, family is considering next-steps; they want to get all information before making decision.        TREATMENT PREFERENCES:   Advance Care Planning:  '[x]'  The Pall Med Interdisciplinary Team has updated the ACP Navigator with [place] and Patient Capacity    Primary Decision Maker ([place]):   Relationship to patient:  '[]'  Named in a scanned document   '[x]'  Legal Next of Kin  '[]'  Guardian    The patient does not have AMD on file, spouse is [facility]    Family Meeting Documentation    Participants: Dr. Gorden Harms, husband [name], daughter Colletta [place], and son [name] (via phone- lives in [place]).     Discussion: The Palliative Medicine SW and Dr. Gorden Harms met with the patient's husband [name], daughter Colletta [place], and son [name] (via phone). Patient is not able to participate in care discussions right now, mental status is improving slowly/somewhat, but unable to participate in complex decision making and remains still w/ periods of confusion.     The patient's family has excellent understanding of the patient's condition and current treatment plan- the patient's daughter Colletta [place] is a Neuroscientist (Phd) and their youngest daughter Lenna Sciara is a family medicine MD.     The patient's family is very reasonable- Colletta [place] shares that the family recognizes that they are at a cross roads of sorts, and have already been discussing the option/possibility of hospice as a family. Colletta [place] jokes that their youngest sister Lenna Sciara (she is a physician) takes the stance of "pull the plug" and would likely be in favor of hospice care. Colletta [place] shares that they recognize that the patient having high calcium is a poor prognostic indicator- family also recognizes that the patient has not been eating, loosing weight, and has become profoundly week in past weeks. Palliative Medicine did provide basic  education on hospice and what this would look like at home. We discuss that when time is limited- family may want to consider what that time looks like and how to spend that time, for example- we verbalized our concerns about the patient being able to drive 2 hours back and forth to see her Oncology Dr. Elinor Parkinson for example. Family is trying to weigh benefit vs. Burden of interventions, and they are also trying to honor her because they share patient always wanted to fight, as evidence by her complex history.     Family shares that they would like to get additional information to make an informed decision regarding where to from here- Colletta [place] plans to speak w/ the patient's Oncologist Dr. Elinor Parkinson tomorrow- she shares that the patient had tissue sample last Friday sent off to see if there were any possible treatment options available for the patient further.. however, family also acknowledges that given her performance status, it is highly possible Oncology may say she cannot tolerate additional treatments or the burdens of treatments will not provide higher quality of life. Colletta [place] shares that they are highly considering hospice as next-step, however, for peace they need to hear from Dr. Elinor Parkinson that they agree with that next step and there are no other options available that will help prolong life and quality of life.     Family also questions possible drain placement/PleurX if she were to go home on hospice due to her history of fluid accumulation, we will have to see how much fluid she  has right now (ultrasound ordered) and can help evaluate that next step if it is needed. We also talk about if she doesn't have enough fluid- hospice could manage this with Oxygen and medications for symptoms/comfort.     Palliative Medicine discussed Code Status and the medical recommendation for DNR, even if the family decides not to pursue hospice care right now- we discuss that things like CPR/shock/ventilation support would not  work in someone with metastatic cancer to bring about quality of life/better outcomes. Family is in agreement with DNR, but continue aggressive treatments and interventions currently.     Please see Dr. Veto Kemps note for additional information. Family will call our team to re-group and discuss further once they are able to speak to her primary Oncologist.     Outcome / Plan: Plan to continue current care, family is weighing options- they are open to further discussions about hospice care and what this would look like, but want further information before making informed decision     Follow Up: Family plans to have discussion w/ the patient's primary Oncologist Dr. Elinor Parkinson- family will call our team once they are able to touch base w/ their Oncologist regarding next steps     Thank you for including Palliative Medicine in the care of  Ariel Day, [place]

## 2021-11-22 NOTE — Progress Notes (Signed)
Spiritual Care Assessment/Progress Note  [facility]    Name: Ariel Day MRN: [id number]    Age: 76 y.o.     Sex: female   Language: English     Date: 11/22/2021            Total Time Calculated: 20 min              Spiritual Assessment begun in [facility] MED SURG  Service Provided For:: Significant other  Referral/Consult From:: Palliative Care  Encounter Overview/Reason : Palliative Care    Spiritual beliefs:      '[x]'$  Involved in a faith tradition/spiritual practice: Christian      '[]'$  Supported by a faith community:      '[]'$  Claims no spiritual orientation:      '[]'$  Seeking spiritual identity:           '[]'$  Adheres to an individual form of spirituality:      '[]'$  Not able to assess:                Identified resources for coping and support system:   Support System: Spouse, Children, Family members       '[]'$  Prayer                  '[]'$  Devotional reading               '[]'$  Music                  '[]'$  Guided Imagery     '[]'$  Pet visits                                        '[]'$  Other: (COMMENT)     Specific area/focus of visit   Encounter:    Crisis:    Spiritual/Emotional needs: Type: Spiritual Support  Ritual, Rites and Sacraments:    Grief, Loss, and Adjustments: Type: Anticipatory Grief  Ethics/Mediation:    Behavioral Health:    Palliative Care: Type: Palliative Care, Family Care  Advance Care Planning:      Visited patient for palliative initial spiritual assessment. Her chart was consulted prior to the visit. Patient's husband Ariel Day was at bedside. The couple have been married some 50 years, have three children and several grandchildren. He spoke of their life together and hopes of her improving. No immediate spiritual care needs expressed.   Ariel Day, Chaplain, MDiv, MS, [facility]

## 2021-11-22 NOTE — Consults (Addendum)
Palliative Medicine Consult  [place]: [phone])    Patient Name: Ariel Day  Date of Birth: 07-11-1945    Date of Initial Consult: 11/21/2021  Date of Today's Visit: 11/22/2021  Reason for Consult: care decisions  Requesting Provider: Dr. Dairl Ponder      SUMMARY:   Ariel Day is a 76 y.o. with a past history of metastatic breast cancer (dx 2017 w bone mets), long hx of treatments followed by Dr. Stephens November at [facility] (current tx capecitabine), hx liver/ bone/ malignant pleural effusion [facility] 10/07/2021 temporary pleural drain), Hx chronic diastolic CHF s/p ICD 4401 EF 10% in past, improved to 65% in 2022 , adrenal insufficiency, orthostatic hypotension , who was admitted on 11/20/2021 from home with a diagnosis of weakness, fatigue, no longer able to walk, AMS.     Current medical issues leading to Palliative Medicine involvement include: patient being treated for hypercalcemia of malignancy (corrected calcium 13.5 on admission)  UTI and also acute LLE DVT extending into Inferior Vena Cava  CT Abd: 11/20/2021   1.  Large filling defect extending from the left femoral vein to the inferior  portion of the inferior vena cava concerning for deep venous thrombosis.  2.  Multiple hepatic masses are again noted compatible with hepatic metastatic  disease   3.  Diffuse skeletal metastases are again visualized  4.  Small volume of ascites.    ECHO 8/27 : EF 65-70%    Social: married to spouse Ariel Day for over 22 years.  They have 3 adult children (Dtr, Ariel Day, local, dtr Ariel Day, in [place] (family practice MD) and son, Ariel Day in [place])  6 grandchildren in [place].   Recent move from [facility] to [place] to live near dtr. Currently in mobile home while inlaw suite under construction (ready this week they hope)  Patient and spouse have traveled extensively in the last 15 years, going to all 39 states in motor home.  They have spent many winters in [place], [place].  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  Spouse notes that patient has had ongoing functional decline in past 6 months, needing more assistance with ADL care       PALLIATIVE DIAGNOSES:   Metabolic encephalopathy  Hypercalcemia of malignancy, breast cancer  Acute cystitis  LLE DVT extending into IVC  Pleural effusions, bilateral R>L  Diffuse skeletal metastatic disease  Ascites  Functional decline, weakness, cancer related fatigue  Poor appetite, weight loss   Palliative medicine encounter       PLAN:   Discussed with attending MD, vascular, oncology, nephrology notes reviewed.  Meeting held today with daughter, Ariel Day and spouse Ariel Day.   Time limited to 30 minutes as dtr has meeting to attend so we focused on upcoming decisions/ answering questions.  They have accurate understanding that she has end stage disease, long journey since diagnosis with breast cancer.   We review briefly events of hospital stay- they understand well current issues.  Education provided that having metastatic cancer with high calcium can be a sign of end stage disease, unless cancer directed treatments can be changed, time may be short.  She's had long journey.  Dtr tells me that Dr. Elinor Parkinson is looking at pathology sample/ retesting to see if there are other treatment options.   Uncertain her functional status will improve enough to make it to more treatments. I am concerned how weak she has become, weight loss, poor appetite for some time.  Family  says they know it's likely time for hospice, we discuss this some, about what is provided, how family's choose the timing of hospice.    When time is short, it's important to carefully weigh burdens of any interventions, and use energy for the most precious things like time with family.  They also are trying to honor her wish to fight this, she has always wanted to keep fighting.   At time of last admission, R pleural fluid was tapped, and decision made to not do Pleurx drain as fluid had not reaccumulated in a long time  and there was concern of downsides (infection risk).  She now has U/S pending to assess fluid on R.  We also talk about option of not tapping - she is not short of breath or in any distress.   They have seen her really SOB/ lot of distress in the past, so would want to be well prepared when/ if she enters hospice.   We talk about pros/cons of thoracentesis/ Pleurx a bit and they will continue to consider this and ultrasound results tomorrow will show how much fluid is there.  Of note , I think patient has AICD in place (discuss more with family if decision made for hospice)  We talked about code status, and family in agreement for protecting her with DNR order. If her heart stops, they would want a peaceful passing for her, understanding CPR would not work in her situation.  Family will reach out to our team once they talk with Dr. Elinor Parkinson oncology about any further treatment options.   They would be accepting of hospice if that is what oncology team recommends.   In summary  Family in agreement that patient should be protected with DNR order  Goal to "tune up" what we can right now prior to discharge home.   Family will get oncology input about any other treatment options and would consider if benefit > burden but realistic there may not be anything else.   They await info from thoracic ultrasound, and recommendations from team about thoracentesis or Pleurx prior to going home  Family is considering hospice but has not made a final decision yet.   ADVANCE CARE PLANNING / TREATMENT PREFERENCES:     GOALS OF CARE:  '[]'$ -Comfort   '[]'$ -Cure   '[]'$ -Prolong life   '[x]'$ -Recovery from acute illness   '[]'$ -Rehabilitation  '[x]'$ -Other:         TREATMENT PREFERENCES:     Patient and family's personal goals include: pt unable to state    Important upcoming milestones or family events: they have been adding to dtr's house, hoping to move in this week (currently in their mobile home in the driveway)     The patient identifies the following  as important for living well: pt enjoys traveling, being on the go all the time, spending time with family    Advance Care Planning:  '[x]'$  The [place] Team has updated the ACP Navigator with [place] and Patient Amity     The patient has appointed the following active healthcare agents:      The Patient has the following current code status:    Code Status: Full Code         Other:    As far as possible, the palliative care team has discussed with patient / health care proxy about goals of care / treatment preferences for patient.     HISTORY:  History obtained from: chart, spouse    CHIEF COMPLAINT: weakness, confusion    HPI/SUBJECTIVE:    The patient is:   '[]'$  Verbal and participatory  '[x]'$  Non-participatory due to: delirium    76 year old female with known breast cancer   Admitted with weakness, confusion   Decline in functional status       ROS / FUNCTIONAL ASSESSMENT:     Palliative Performance Scale (PPS)  PPS: 30         Modified-[place] Symptom Assessment Scale (ESAS)  Pain Score: No pain  Dyspnea Score: No shortness of breath    Clinical Pain Assessment  Severity: 0               PSYCHOSOCIAL/SPIRITUAL SCREENING:     Palliative IDT has assessed this patient for cultural preferences / practices and a referral made as appropriate to needs Orthoptist, Patient Advocacy, Ethics, etc.)    Spiritual affiliation was reviewed as documented by palliative care chaplain.      Any spiritual / religious / cultural beliefs and practices that will affect your patient's care?:  '[]'$  Yes /  '[x]'$  No   If "Yes" to discuss with pastoral care during IDT     Does caregiver feel burdened by caring for their loved one:   '[]'$  Yes /  '[x]'$  No /  '[]'$  No Caregiver Present/Available '[]'$  No Caregiver '[]'$  Pt Lives at Facility  If "Yes" to discuss with social work during IDT    Anticipatory grief assessment:   '[x]'$  Normal  / '[]'$  Maladaptive     If "Maladaptive" to discuss with  social work during IDT     PHYSICAL EXAM:     From Therapist, sports flowsheet:  Wt Readings from Last 3 Encounters:   11/21/21 63 kg (138 lb 14.2 oz)   10/08/21 64.9 kg (143 lb)     Blood pressure 112/77, pulse (!) 104, temperature 98.4 F (36.9 C), temperature source Axillary, resp. rate 17, height 1.65 m (5' 4.96"), weight 63 kg (138 lb 14.2 oz), SpO2 98 %.    Last bowel movement, if known:     Constitutional: pale, chronically ill appearing, sleeping NAD     HISTORY:     Principal Problem:    AMS (altered mental status)  Active Problems:    Acute encephalopathy    Carcinoma of breast metastatic to bone (HCC)    Complicated UTI (urinary tract infection)    Hypercalcemia    Acute deep vein thrombosis (DVT) of femoral vein of left lower extremity ([place])    Palliative care by specialist    Debility    Neoplastic malignant related fatigue  Resolved Problems:    * No resolved hospital problems. *    Past Medical History:   Diagnosis Date    Breast cancer ([place])     Cardiomyopathy ([place])     EF10%; 65% 2022    Malignant pleural effusion     Orthostatic hypotension       Past Surgical History:   Procedure Laterality Date    CARDIAC DEFIBRILLATOR PLACEMENT Left 2012    CHOLECYSTECTOMY      JOINT REPLACEMENT Bilateral     MASTECTOMY, BILATERAL        Family History   Problem Relation Age of Onset    Heart Disease Mother     Breast Cancer Mother     Heart Disease Father       History reviewed, no pertinent family history.  Social History  Tobacco Use    Smoking status: Never    Smokeless tobacco: Not on file   Substance Use Topics    Alcohol use: Not Currently     No Known Allergies   Current Facility-Administered Medications   Medication Dose Route Frequency    midodrine (PROAMATINE) tablet 2.5 mg  2.5 mg Oral BID    sodium chloride flush 0.9 % injection 5-40 mL  5-40 mL IntraVENous 2 times per day    sodium chloride flush 0.9 % injection 5-40 mL  5-40 mL IntraVENous PRN    0.9 % sodium chloride infusion   IntraVENous PRN     ondansetron (ZOFRAN-ODT) disintegrating tablet 4 mg  4 mg Oral Q8H PRN    Or    ondansetron (ZOFRAN) injection 4 mg  4 mg IntraVENous Q6H PRN    polyethylene glycol (GLYCOLAX) packet 17 g  17 g Oral Daily PRN    acetaminophen (TYLENOL) tablet 650 mg  650 mg Oral Q6H PRN    Or    acetaminophen (TYLENOL) suppository 650 mg  650 mg Rectal Q6H PRN    predniSONE (DELTASONE) tablet 10 mg  10 mg Oral Daily    ceFEPIme (MAXIPIME) 2,000 mg in sodium chloride 0.9 % 50 mL IVPB (mini-bag)  2,000 mg IntraVENous Q24H    heparin (porcine) injection 5,700 Units  80 Units/kg IntraVENous PRN    heparin (porcine) injection 2,850 Units  40 Units/kg IntraVENous PRN    heparin 25,000 units in dextrose 5% 250 mL (premix) infusion  18-30 Units/kg/hr IntraVENous Continuous          LAB AND IMAGING FINDINGS:     Lab Results   Component Value Date/Time    WBC 25.8 11/22/2021 04:45 AM    HGB 12.9 11/22/2021 04:45 AM    PLT 244 11/22/2021 04:45 AM     Lab Results   Component Value Date/Time    NA 137 11/22/2021 04:45 AM    K 3.4 11/22/2021 04:45 AM    CL 94 11/22/2021 04:45 AM    CO2 34 11/22/2021 04:45 AM    BUN 29 11/22/2021 04:45 AM    MG 1.8 11/22/2021 04:45 AM    PHOS 2.5 11/22/2021 04:45 AM      Lab Results   Component Value Date/Time    GLOB 3.6 11/22/2021 04:45 AM     Lab Results   Component Value Date/Time    INR 1.2 11/20/2021 07:18 PM    APTT 25.1 11/20/2021 07:18 PM      No results found for: IRON, TIBC, IBCT, FERR   No results found for: PH, PCO2, PO2  No components found for: GLPOC   No results found for: CPK, CKMB, TROPONINI           Only check if applicable and billing time based rather than MDM    '[x]'$    The total encounter time on this service date was  75  minutes which was spent performing a face-to-face encounter and personally completing the provider-level activities documented in the note.  This includes time spent prior to the visit and after the visit in direct care of the patient.  This time does not include time spent  in any separately reportable services.

## 2021-11-22 NOTE — Plan of Care (Signed)
Problem: Physical Therapy - Adult  Goal: By Discharge: Performs mobility at highest level of function for planned discharge setting.  See evaluation for individualized goals.  Description: FUNCTIONAL STATUS PRIOR TO ADMISSION:  Patient unable to provide history at evaluation.  Per her husband, she has had a gradual progressive decline for about a year, increasing significantly for a few weeks prior to admission.  She has required assistance for all ADLs, although still contributing, and her mobility has declined from ambulating with RW to primarily using a wheelchair.    HOME SUPPORT: Patient lives with husband who provides assistance with all mobility and ADLs.   They have been building an accessible addition to live at their daughter's house and have been staying in a motor home/ RV on daughter's property in the meantime.    Physical Therapy Goals  Initiated 11/22/2021  1.  Patient will move from supine to sit and sit to supine, scoot up and down, and roll side to side in bed with minA within 7 day(s).    2.  Patient will perform sit to stand with minA within 7 day(s).  3.  Patient will transfer from bed to chair and chair to bed with minA using the least restrictive device within 7 day(s).  4.  Patient will ambulate with moderate assistance for 15 feet with the least restrictive device within 7 day(s).   Outcome: Progressing  PHYSICAL THERAPY EVALUATION    Patient: Ariel Day (76 y.o. female)  Date: 11/22/2021  Primary Diagnosis: Hypercalcemia [E83.52]  Abnormal LFTs [R79.89]  Acute encephalopathy [Q46.96]  Complicated UTI (urinary tract infection) [N39.0]  Carcinoma of breast metastatic to bone, unspecified laterality ([place]) [C50.919, C79.51]  AMS (altered mental status) [R41.82]       Precautions:                      ASSESSMENT :   DEFICITS/IMPAIRMENTS:   The patient presents with impaired cognition (A&O x2), generalized weakness, debility, poor balance, impaired coordination, orthostatic hypotension  (symptomatic with dizziness and nausea but improved with leg exercises), decreased activity tolerance, and overall decline in functional mobility s/p admission for AMS/general weakness/mobility decline.  Patient has extensive PMH including heart failure (EF 10%) as well as breast cancer with liver and bone metastases.  Pt unable to provide accurate PLOF.  Per her husband, she has had a gradual progressive decline for about a year, increasing significantly for a few weeks prior to admission.  She has required assistance for all ADLs, although still contributing, and her mobility has declined from ambulating with RW to primarily using a wheelchair.      This date, pt transferred supine<>sit with modA and required assistance for sitting balance d/t posterior lean.  Transferred sit<>stand with modA and took 1 side step along EOB with modA x2 - only tolerated standing for ~20 seconds before sitting impulsively.         Patient and her husband have been having an accessible addition built to live at their daughter's house, which her husband reports should be completed soon.  In the meantime, they have been staying in a motor home/ RV, which her husband appropriately acknowledged will not be a practical option at d/c d/t confined space and stairs to enter/ exit.  Recommending SNF upon discharge.  If patient is to d/c home, she will benefit from [facility] PT, continued assistance with all mobility and ADLs, and family to make alternate accommodations while the addition is still being built.  Ideally she would have no stairs to navigate and enough space for a BSC and to maneuver a wheelchair.      Note patient was also found to have an extensive LLE DVT, is on heparin gtt (anti Xa 0.88), and cleared to mobilize today by hospitalist.    Patient will benefit from skilled intervention to address the above impairments.    Functional Outcome Measure:  The patient scored 12/24 on the AMPAC outcome measure.           PLAN  :  Recommendations and Planned Interventions:   bed mobility training, transfer training, gait training, therapeutic exercises, neuromuscular re-education, patient and family training/education, and therapeutic activities    Frequency/Duration: Patient will be followed by physical therapy to address goals, PT Plan of Care: 3 times/week to address goals.    Recommendation for discharge: (in order for the patient to meet his/her long term goals):   SNF.  If d/c home, then [facility] PT, continued assistance with all mobility/ADLs, and alternate accommodations as it is not feasible to return to RV.     Other factors to consider for discharge: complex living situation, progressive decline in function, fall risk, orthostatic, requires assist with all mobility and ADLs.    IF patient discharges home will need the following DME: patient owns DME required for discharge       SUBJECTIVE:   Patient stated "I feel dizzy."    OBJECTIVE DATA SUMMARY:       Past Medical History:   Diagnosis Date    Breast cancer ([place])     Cardiomyopathy ([place])     EF10%; 65% 2022    Malignant pleural effusion     Orthostatic hypotension      Past Surgical History:   Procedure Laterality Date    CARDIAC DEFIBRILLATOR PLACEMENT Left 2012    CHOLECYSTECTOMY      JOINT REPLACEMENT Bilateral     MASTECTOMY, BILATERAL         Home Situation:  Social/Functional History  Lives With: Spouse (addition being completed soon, only other option is a motor home/ RV with steps to enter)  Type of Home: House  Home Layout: Able to Live on Main level with bedroom/bathroom  Home Access: Ramped entrance  Bathroom Equipment: 3-in-1 commode  Home Equipment: [place], [place] Help From: Family    Cognitive/Behavioral Status:  Orientation  Overall Orientation Status: Impaired  Orientation Level: Oriented to place;Oriented to person;Disoriented to time;Disoriented to situation  Cognition  Overall Cognitive Status: Exceptions  Arousal/Alertness: Delayed  responses to stimuli  Following Commands: Follows one step commands with repetition  Memory: Decreased short term memory  Insights: Decreased awareness of deficits  Initiation: Requires cues for all  Sequencing: Requires cues for all    Vision/Perceptual:          Vision  Vision: Within Functional Limits  Perception  Overall Perceptual Status: WFL    Strength:    Strength: Grossly decreased, non-functional    Tone & Sensation:   Tone: Normal  Sensation: Intact    Coordination:  Coordination: Grossly decreased, non-functional    Range Of Motion:  AROM: Generally decreased, functional       Functional Mobility:  Bed Mobility:     Bed Mobility Training  Bed Mobility Training: Yes  Rolling: Moderate assistance  Supine to Sit: Moderate assistance  Transfers:     Pharmacologist: Yes  Sit to Stand: Moderate assistance  Stand to Sit: Moderate assistance  Balance:               Balance  Sitting: Impaired  Sitting - Static: Fair (occasional)  Sitting - Dynamic: Poor (constant support)  Standing: Impaired  Standing - Static: Poor;Constant support  Standing - Dynamic: Poor;Constant support                                                                                                                                                                                                                                     [employer]	[place] AM-PAC      Basic Mobility Inpatient Short Form (6-Clicks) Version 2  How much HELP from another person do you currently need... (If the patient hasn't done an activity recently, how much help from another person do you think they would need if they tried?) Total A Lot A Little None   1.  Turning from your back to your side while in a flat bed without using bedrails? '[]'$   1 '[]'$   2 '[x]'$   3  '[]'$   4   2.  Moving from lying on your back to sitting on the side of a flat bed without using bedrails? '[]'$   1 '[x]'$   2 '[]'$   3  '[]'$   4   3.  Moving to and from a bed to a chair (including a  wheelchair)? '[]'$   1 '[x]'$   2 '[]'$   3  '[]'$   4   4. Standing up from a chair using your arms (e.g. wheelchair or bedside chair)? '[]'$   1 '[x]'$   2 '[]'$   3  '[]'$   4   5.  Walking in hospital room? '[]'$   1 '[x]'$   2 '[]'$   3  '[]'$   4   6.  Climbing 3-5 steps with a railing? '[x]'$   1 '[]'$   2 '[]'$   3  '[]'$   4     Raw Score: 12/24                            Cutoff score ?171,2,3 had higher odds of discharging home with home health or need of SNF/IPR.    [place], Jon Gills, Vinoth Debbe Bales, Mena Goes Passek, Roslynn Amble. Annabell Howells.  Validity of the AM-PAC "6-Clicks" Inpatient Daily Activity and Basic Mobility Short Forms. Physical Therapy Mar 2014, 94 (3) 379-391; DOI: 10.2522/ptj.20130199  2. Berenice Primas  Deeann Dowse. Association of AM-PAC "6-Clicks" Basic Mobility and Daily Activity Scores With Discharge Destination. Phys Ther. 2021 Apr 4;101(4):pzab043. doi: 10.1093/ptj/pzab043. PMID: 54098119.  [place], Rajaraman D, Tanna Furry, Agayby K, [place] S. Activity Measure for Post-Acute Care "6-Clicks" Basic Mobility Scores Predict Discharge Destination After Acute Care Hospitalization in Select Patient Groups: A Retrospective, Observational Study. Arch Rehabil Res Clin Transl. 2022 Jul 16;4(3):100204. doi: 10.1016/j.arrct.1478.295621. PMID: 30865784; PMCID: [id number].  4. Vonzell Schlatter, [place] W, Ni P. AM-PAC Short Forms Manual 4.0. Revised 04/2018.                                                                                                                                                                                                                               Activity Tolerance:   Poor    After treatment:   Patient left in no apparent distress in bed, Call bell within reach, Bed/ chair alarm activated, Caregiver / family present, and Side rails x3    COMMUNICATION/EDUCATION:   The patient's plan of care was discussed with: occupational therapist and registered nurse    Thank you for this  referral.  Lenord Carbo, PT, DPT  Minutes: [phone]

## 2021-11-22 NOTE — ACP (Advance Care Planning) (Addendum)
Adv Care Plan    Patient has delirium and cannot participate now in decisions about care goals    Her NOK, spouse, Ariel Day discussed code status with our team today and decision made to protect patient from CPR/ Shock/ Code Blue by placing DNR order and allowing natural passing if that happens.     20 m

## 2021-11-22 NOTE — Progress Notes (Signed)
[place] [facility]  [address], [address] [place] [address]  W: [phone]  F: [phone]    Reason for Evaluation:   Ariel Day is a 76 y.o. 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 Dairl Ponder for evaluation of metastatic breast cancer.      Hematology/Oncology Treatment History:   Follows at [facility] with Dr. Stephens November  ER+/PR+ lobular breast cancer diagnosed 12/20/2015 after presenting with multifocal bone mets  Current treatment: oral capecitabine 500 mg BID one week on, one week off.  Has been on hold since early August.      History of Present Illness:   Ariel Day is a pleasant 76 y.o. female who presents today for evaluation of metastatic breast cancer.      Patient admitted with complaints of generalized weakness, decreased PO intake, and confusion. Vitals on admission were stable.  Labs notable for WBC 20.3, left-shifted, Na 131, K 6.2, Cr 1.31, calcium 13.5, AST 112, alk phos 299, albumin 2.6.  CXR showed pulmonary edema with bilateral pleural effusions as well as lytic lesions in scapulae. CT head negative for acute abnormality.  UA with small blood and 1+ bacteria. She was treated with Lasix and empiric cefepime and admitted to medicine.    CT A/P noted large filling defect extending from L femoral vein to inferior portion of IVC, concerning for DVT; multiple hepatic mets; diffuse skeletal mets; and small volume ascites.  Doppler US with acute occlusive thrombus in L external iliac, common femoral, saphenofemoral junction, profunda femoral, and femoral. Subacute occlusive thrombus in the popliteal and posterior tibial veins. Limited visualization of the peroneal veins but show color filling; cannot exclude non-occlusive thrombus. No DVT in RLE.    At my evaluation, patient remains  confused.  She is able to answer some questions but not fully provide history.  Her husband states that they called their oncologist on Friday about the confusion and dehydration, and he advised trying Pedialyte/hydration, and if persisted to present to ER.  She has been off her capecitabine for ~2 weeks.    Interval History:  Patient remains somnolent.  Husband and daughter met with palliative care.  Per daughter, this is significant decline from the last 2-3 weeks.  Calcium levels improving, 10.5 uncorrected.  Stable large pleural effusions.    Per Dr. Venetia Constable outpatient note 08/2021:  a. Initially presented w/ severe LBP to [facility] ER 12/01/15, where plain films were unremarkable but CT of L spine showed "Extensive neoplastic infiltration of the lumbar spine -- metastatic disease versus multiple myeloma. Neoplastic extension into the spinal canal at multiple levels, potentially with significant neoplastic thecal sac compression at L2-L3.  Minor pathologic fractures through the neoplastically infiltrated T12 and L3 vertebral bodies."  b. CT of H/C/A/P (12/03/15): Calvarial lesions but normal brain and upper CT spine; Diffuse neoplastic infiltration of the axial skeleton. In addition to the known diffuse lytic lesions in the lumbar spine, there are additional lytic lesions throughout the thoracic spine, sternum, ribs, and iliac crests. Some of these areas demonstrate cortical breakthrough suggestive of pathologic fractures at T8, T12 and, most notably, L3. As noted on the prior report, the extensive infiltration of the L3 vertebral body encroaches into the spinal canal with resultant stenosis. Diffuse pleural nodularity with multiple bilateral subpleural nodules. There are associated  small bilateral pleural effusions. Main diagnostic considerations for the above findings include metastatic disease or multiple myeloma  c. Diagnostic BMBx (12/06/15) preliminary is negative for overt myeloma, negative flow cytometry,  no core and clot section for any increase or restriction in plasma cells.  SPEP w/ IFE, random UPEP w/ IFE, immunoglobulin panel, SFLCA all normal.  d. Repeat diagnostic biopsy of iliac mass (12/08/15) appears consistent with lobular breast cancer, CA 27-29 was 965, CEA 3.8, CA 19-9 just 7.  e. Bone scan (05/09/16) reveals that there has been interval progression of widespread osseous metastatic disease. There is new unusual nonspecific activity about both knees, involving both distal femurs and proximal tibias, right greater than left, also involving the left distal tibia.  This activity is of uncertain etiology and may be related to marrow reaction versus marrow replacement by metastatic disease.  f. X-ray L femur (08/28/16) shows status post left hip arthroplasty with good position of the compartments. No evidence of metastatic disease to the left femur.  g. Pelvis XR (08/28/16) Lucent lesion posterior left iliac crest may represent treated lesion which represented a lytic lesion on prior CT. No definite new lytic lesions. Severe degenerative change right hip. Status post left hip arthroplasty.  h. CT c/a/p (11/24/16) reveals significant interval improvement of pleural-based metastatic disease. Decreased heterogeneity and thickening of the endometrium, though this remains abnormal, suspicious for endometrial neoplasm. Interval sclerosis of osseous metastases, likely healing response. No new lesions identified.  i. Bone scan (11/24/16) documents diffuse osseous metastatic disease, not significantly changed from the prior bone scintigram.  j. Sequence analysis and deletion/duplication testing of the 46 genes (APC, ATM, AXIN2, BARD1, BMPR1A, BRCA1, BRCA2, BRIP1, CDH1, CDKN2A (p14ARF), CDKN2A (p16INK4a), CHEK2, CTNNA1, DICER1, EPCAM (EPCAM: Deletion/duplication testing only ([id number]).), GREM1 (GREM1: Promoter region deletion/duplication testing only.), KIT, MEN1, MLH1, MSH2, MSH3, MSH6, MUTYH, NBN, NF1, PALB2, PDGFRA,  PMS2, POLD1, POLE, PTEN, RAD50, RAD51C, RAD51D, SDHB, SDHC, SDHD, SMAD4, SMARCA4, STK11, TP53, TSC1, TSC2 VHL. The following genes were evaluated for sequence changes only: HOXB13 (c.251G>A, p.Gly84Glu variant only), NTHL1 (NTHL1:  Deletion/duplication analysis is not offered for this gene ([id number]).), SDHA. Results are negative unless otherwise indicated.  k. CT Right lower extremity (08/03/17) shows no convincing evidence of metastatic disease in the right femur. There is a probable small treated metastasis in the right ischium. Advanced right hip DJD. Pattern of sclerosis in the medial and lateral femoral condyles suggesting chronic AVN.  Marland Kitchen CT L-spine (08/03/17)  Extensive osseous metastatic disease having mixed lytic/sclerotic features, all unchanged compared with body CT from 11/24/2016. No evidence of disease progression. No significant disc disease at any level. CT pelvis 08/03/17) documents pelvic metastatic disease appears stable. Severe right hip osteoarthritis without significant change.  l. CTA (08/14/18) shows no PE or acute aortic syndrome. Bilateral intermediate sized pleural effusions. Extensive osseous metastatic disease within the sternum, vertebral column and ribs. No pathologic fracture. CXR reveals small bilateral pleural effusions. Retrocardiac opacity, which may represent atelectasis or infiltrate. CT right lower extremity reveals severe chronic AVN with head collapse/pathologic fracture and associated severe right hip joint degenerative changes. Findings of bone metastasis with a destructive cortical lesion involving the right iliac bone and 2 small femoral lesions. Whole-body bone scan may be helpful for further evaluation of the skeleton. 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  m. Radiation Oncology Re-evaluation (08/14/18) for symptomatic bone mets. Palliative radiotherapy recommended to the right femur and right lateral ribcage, completed 09/18/18.  n. Bone scan (09/10/18)  Significant  increase in multifocal abnormal uptake in keeping with progression of osseous metastatic involvement. Consider CT imaging of the chest/abdomen/pelvis and CT or MR of the head, unless more recently performed cross-sectional imaging studies have been performed at outside institutions (the most recent CT imaging of the chest/abdomen/pelvis available is dated 11/24/2016).  o. CTA chest (09/25/2018) No pulmonary thromboemboli. Small pleural effusions with associated regions of compressive atelectasis. Progressive osseous metastatic disease with multiple new osteolytic metastases as described. There is probably a pathologic fracture associated with the metastasis involving the lateral right fifth rib. The T5 lesion violates the posterior cortex with epidural extension, the degree of encroachment upon the thecal sac is not well assessed by this modality. Thoracic spine MRI could be performed to reevaluate depending on the patient's symptoms.  p. Radiation Oncology Re-evaluation (09/26/18) Palliative radiotherapy recommended to T5 and T6 vertebral bodies over 10 fractions, started 10/08/18.  q. Recommended to rotate to Letrozole and Palbociclib on 10/29/18 due to disease progression (she received radiation to her right hip as well as areas of her spine and ribs immediately prior to this transition).  r. ED to admission for acute cholecystitis (02/21/19) with subsequent lap chole on 11/30. Pathology [phone]) reveals cholecystitis with ulceration. Focal hepatic parenchyma with steatosis. One reactive benign lymph node.  s. Patient resumed therapy with Ibrance (75 mg daily, 3 weeks on and 1 week off) and Letrozole on 04/15/19.   t. Right femur x-ray (08/15/19) evidence of interval right total hip arthroplasty. No evidence of hardware failure or loosening. Lytic lesion along the posterior aspect of the right mid femoral diaphysis, suspicious for new metastatic lesion, new since 08/03/2017 CT. Diffuse osteopenia.  Several additional small lytic osseous metastatic lesions are suspected particularly involving the proximal fibular diaphysis anteriorly, but also seen more diffusely.   u. Bone scan (09/15/19)  Interval progression in number and extent of involvement of diffuse osseous metastatic disease. Only subtle radiotracer uptake adjacent to the predominantly lytic right femoral metadiaphyseal lesion. No findings of hip arthroplasty loosening or instability.  v. RFA to painful lesion in right femur (09/19/19).  w. CT head (09/24/19) No CT evidence of intracranial metastatic disease. However, MRI is more sensitive for detection of small metastases. Extensive lytic lesions throughout the calvarium, may represent metastatic disease or multiple myeloma.   x. CT c/a/p (09/24/19) Interval development of moderate to large right and small-to-moderate left pleural effusions with adjacent atelectasis. Interval development of diffuse hepatic metastatic disease with largest lesion measuring 2.0 cm. Extensive osseous metastatic disease  y. Capecitadine 1500 mg bid, 1 week on and 1 week off, C1D1 on 10/03/19  z. Thoracentesis (10/07/19) Pathology of right pleural fluid ([id number]) Positive for malignancy. Few clusters of atypical cells present, consistent with metastatic breast carcinoma. ER+ (3+, 90%). PR- (0+, 0%). HER-2/NEU negative. (0+).  aa. CXR (10/16/19) Small, but slightly increased, right pleural effusion. Small left pleural effusion similar to prior. Known osseous metastatic disease with expansile left-sided rib lesions.  ab. ED presentation with subsequent admission (07/30/20) for decreased urine output, cough and shortness of breath. Found to be COVID+.  CXR - Left pleural effusion and probable right pleural effusion, not appreciably changed. No evidence of consolidation. CTA chest -  No evidence of pulmonary embolism. Moderately large (right greater than left) pleural effusions with accompanying basilar  atelectasis. Redemonstration  of diffuse osseous metastatic disease without superimposed acute complicating feature. CT a/p - Mildly thick-walled urinary bladder, correlation for cystitis may be helpful as appropriate clinically. > No evidence of  hydronephrosis or pyelonephritis. No abnormal bowel wall thickening or inflammatory change. Regarding oncologic findings: > Significant interval improvement in the appearance of hepatic metastatic disease. > Diffuse osseous metastatic disease without evidence of acute complicating feature. CT chest (08/01/20)  Moderate-sized pleural effusions, right larger than left appearing very similar to prior  study. > Bibasilar areas of atelectasis. No new pulmonary parenchymal abnormality demonstrated. Imaging stigmata compatible with known metastatic breast cancer to include partial inclusion of known hepatic metastasis as well as osseous metastatic disease without  superimposed acute complicating feature. CXR (08/02/20) No evidence of pneumothorax following right thoracentesis. Persistent small left effusion.  ac. ED to hospital admission (05/09/21 [facility]) in [place] for shortness of breath. CXR - Increased interstitial pulmonary markings at the lower lung fields with associated small pleural effusions, suggestive of fluid overload/mild dependent edema. CTA chest - No pulmonary embolus or aortic dissection. Large bilateral pleural effusions with associated compressive atelectasis. Small amount of ascites in the visualized upper abdomen. Cirrhotic appearing liver. Diffuse skeletal metastatic disease. She underwent US guided thoracentesis while admitted with a total of 600 ml of red colored fluid removal. No post-procedure pneumothorax on sonogram.    Social History:  Lives in a motor home.  Building a home in [place] was obtained and pertinent findings reviewed above. Past medical history, social history, family history, medications, and allergies are located in the electronic  medical record.    Physical Exam:   BP 129/81   Pulse (!) 107   Temp 97.5 F (36.4 C) (Axillary)   Resp 17   Ht 5' 4.96" (1.65 m)   Wt 138 lb 14.2 oz (63 kg)   SpO2 96%   BMI 23.14 kg/m   ECOG PS: 3  General: somnolent   Respiratory: normal respiratory effort,  CV: tachycardic, no peripheral edema  Ext: warm, well perfused, calf asymmetry (L>R)  GI: soft, nontender, nondistended, no masses  Skin: no rashes, no ecchymoses, no petechiae    Results:     Lab Results   Component Value Date/Time    WBC 25.8 11/22/2021 04:45 AM    HGB 12.9 11/22/2021 04:45 AM    HCT 41.0 11/22/2021 04:45 AM    PLT 244 11/22/2021 04:45 AM    MCV 104.1 11/22/2021 04:45 AM     Lab Results   Component Value Date/Time    NA 137 11/22/2021 04:45 AM    K 3.4 11/22/2021 04:45 AM    CL 94 11/22/2021 04:45 AM    CO2 34 11/22/2021 04:45 AM    BUN 29 11/22/2021 04:45 AM     Lab Results   Component Value Date/Time    ALT 61 11/22/2021 04:45 AM    GLOB 3.6 11/22/2021 04:45 AM     Records from Epic reviewed and summarized above.  Test results above have been reviewed.    Imaging:     CT Result (most recent):  CT ABDOMEN PELVIS W IV CONTRAST 11/20/2021    Narrative  EXAM: CT ABDOMEN PELVIS W IV CONTRAST    INDICATION: abd pain w/ AMS and N/V    COMPARISON: 10/07/2021    CONTRAST: 100 mL of Isovue-370.    ORAL CONTRAST: None    TECHNIQUE:  Following the intravenous administration of intravenous contrast, axial images  were obtained through the abdomen and pelvis. Coronal and sagittal  reconstructions were generated. CT dose reduction was achieved through use of a  standardized protocol tailored for this examination  and automatic exposure  control for dose modulation.    FINDINGS:  There are moderate-sized bilateral pleural effusions with adjacent compressive  atelectasis.    A 2.4 cm cyst is noted in the right hepatic lobe.  There are multiple masses in  the liver are again visualized compatible with diffuse metastatic disease.  One  of the largest  is located in the posterior right hepatic lobe measures up to 2.5  cm (series 3 image number 46).   The spleen enhances appropriately.   The  pancreas enhances homogeneously. No discrete masses.  The gallbladder is  surgically absent.  No significant biliary dilatation.    The adrenal glands appear normal.    The kidneys enhance symmetrically. No hydronephrosis.    Visualized portions of the bladder are grossly unremarkable.  Pelvic structures  are not well visualized because of metallic streak artifact from bilateral hip  arthritis hardware.    The uterus is present.    There is a small volume of ascites in the abdomen.  There is no free air.  There  are no abscesses.    No significant lymphadenopathy.    No evidence of intestinal obstruction.  The appendix appears normal.    Aorta is normal in caliber.  There is apparent extensive filling defect from the left femoral vein to the  very inferior aspect of the inferior vena cava compatible with large DVT.    Diffuse skeletal metastatic disease is again noted.    Impression  1.  Large filling defect extending from the left femoral vein to the inferior  portion of the inferior vena cava concerning for deep venous thrombosis.    2.  Multiple hepatic masses are again noted compatible with hepatic metastatic  disease.    3.  Diffuse skeletal metastases are again visualized.    4.  Small volume of ascites.      The findings were conveyed to [facility]  via perfectserve at 11/20/2021 5:54 PM  by Denna Haggard.  [place]      Imaging personally reviewed by me    Assessment:   1) Metastatic lobular breast cancer (ER+/PR+)  Follows with Dr. Stephens November at [facility].  Sites of disease include diffuse bone mets, liver mets, and malignant pleural effusion. Currently on oral capecitabine 500 mg BID one week on, one week off.  It appears this was started in 2021 per his notes.  This has been on hold since early-mid August.    GOC ongoing.  Greatly appreciate Palliative Care  expertise. Patient's daughter and husband share that patient appears to have had a rather dramatic clinical decline over short period of time.  Family understand that prognosis is guarded, especially in setting of hypercalcemia of malignancy, which is often a marker of advanced disease, but family would like to give a little more time for acute interventions (e.g. improvement of calcium and UTI) prior to making decisions about palliative-based care.  Patient's daughter plans to reach out to Dr. Elinor Parkinson to update him on current clinical status.  Would defer final treatment decisions to Dr. Elinor Parkinson, but patient is not currently a candidate for cancer-directed therapy.  We also discussed that pending clinical course, it may be reasonable to consider PleurX given malignant pleural effusion but this would largely depend upon acute recovery.   - Hold capecitabine    #) Hypercalcemia of malignancy, improving:  Presented with calcium 12.3, corrected 13.5  - Status post calcitonin 4 units/kg x 4 doses and Zometa  x 1 dose.  Calcium improved to corrected 11.7.  Remains somnolent, in part due to ongoing hypercalcemia  - Lasix per medicine  - Nephrology following    #) Metabolic encephalopathy: due to hypercalcemia and UTI    #) Extensive acute left proximal and distal vein DVT:  Incidentally noted on CT A/P with filling defect from L femoral vein extending to IVC.  Follow up Doppler with extensive DVT throughout L external ilac to femoral vein and subacute occlusive thrombus in popliteal and posterior tibial veins.  Risk factors: a) metastatic breast cancer  - Currently on heparin drip.  Plan to switch to Eliquis with load this evening.  Recommend lifelong AC    #) Acute on chronic heart failure: Lasix per medicine    #) Acute cystitis: antibiotics per medicine    #) Orthostatic hypotension: management per medicine    #) Adrenal insufficiency: on chronic prednisone 10 mg daily    Oncology will follow along as needed. Thank you for  involving me in the care of this patient.    Signed By: Kristopher Oppenheim, MD

## 2021-11-22 NOTE — Progress Notes (Signed)
Name: Ariel Day   MRN: [id number]  DOB: [birth year]    Nephrology Progress Note    Assessment:     AKI: Serum Cr 1.'3mg'$ /dl->> improving to 1.'1mg'$ /dl. Suspected pre-renal state.      Hypercalcemia: Peak corrected Ca 13.4. Improving with IV Furosemide/Calcitonin. Corrected Ca today down to 11.7. Etiology likely 2 to metastatic Breast CA.     Combined Systolic/Diastolic CHF: +pulm edema/b/l pleural effusions     Metastatic Breast CA     Staph Bacteremia    Hypokalemia    Plan/Recommendations:  S/p IV Zometa last night  S/p SQ Calcitonin (total of 4 doses)  Trend Calcium level  IV KCl  F/u Bld Cx. IV Abx  Overall prognosis guarded/poor-palliative care consulting  AM labs    Subjective:  Sitting up in the bed. Poor appetite per RN.     ROS:   No nausea, no vomiting  No chest pain, no shortness of breath    Exam:  BP 107/75   Pulse (!) 114   Temp 98.4 F (36.9 C) (Oral)   Resp 17   Ht 1.65 m (5' 4.96")   Wt 63 kg (138 lb 14.2 oz)   SpO2 95%   BMI 23.14 kg/m     Gen: NAD/Elderly/Frail  HEENT: No icterus, mmm,  AT/NC  Lungs/Chest wall: Clear. Equal BS. No respiratory distress  Cardiovascular: Regular rate, normal rhythm.   Abdomen/GU: Soft, NT,  BS+  Ext: minimal peripheral edema      Current Facility-Administered Medications   Medication Dose Route Frequency Provider Last Rate Last Admin    midodrine (PROAMATINE) tablet 2.5 mg  2.5 mg Oral BID Bleck B Ngwafang, MD   2.5 mg at 11/22/21 0809    sodium chloride flush 0.9 % injection 5-40 mL  5-40 mL IntraVENous 2 times per day Bleck B Ngwafang, MD   10 mL at 11/22/21 0809    sodium chloride flush 0.9 % injection 5-40 mL  5-40 mL IntraVENous PRN Bleck B Ngwafang, MD        0.9 % sodium chloride infusion   IntraVENous PRN Bleck B Ngwafang, MD        ondansetron (ZOFRAN-ODT) disintegrating tablet 4 mg  4 mg Oral Q8H PRN Bleck B  Ngwafang, MD        Or    ondansetron (ZOFRAN) injection 4 mg  4 mg IntraVENous Q6H PRN Bleck B Ngwafang, MD   4 mg at 11/22/21 0828    polyethylene glycol (GLYCOLAX) packet 17 g  17 g Oral Daily PRN Bleck B Ngwafang, MD        acetaminophen (TYLENOL) tablet 650 mg  650 mg Oral Q6H PRN Bleck B Ngwafang, MD        Or    acetaminophen (TYLENOL) suppository 650 mg  650 mg Rectal Q6H PRN Bleck B Ngwafang, MD        predniSONE (DELTASONE) tablet 10 mg  10 mg Oral Daily Bleck B Ngwafang, MD   10 mg at 11/22/21 0809    ceFEPIme (MAXIPIME) 2,000 mg in sodium chloride 0.9 % 50 mL IVPB (mini-bag)  2,000 mg IntraVENous Q24H Bleck B Ngwafang, MD        heparin (porcine) injection 5,700 Units  80 Units/kg IntraVENous PRN Bleck B Ngwafang, MD        heparin (porcine) injection 2,850 Units  40 Units/kg IntraVENous PRN Bleck B Ngwafang, MD        heparin 25,000 units in dextrose 5% 250 mL (premix) infusion  18-30 Units/kg/hr IntraVENous Continuous Bleck B Ngwafang, MD 5 mL/hr at 11/22/21 1159 7 Units/kg/hr at 11/22/21 1159        Labs/Data:    Lab Results   Component Value Date    WBC 25.8 (H) 11/22/2021    HGB 12.9 11/22/2021    HCT 41.0 11/22/2021    MCV 104.1 (H) 11/22/2021    PLT 244 11/22/2021       Lab Results   Component Value Date/Time    NA 137 11/22/2021 04:45 AM    K 3.4 11/22/2021 04:45 AM    CL 94 11/22/2021 04:45 AM    CO2 34 11/22/2021 04:45 AM    BUN 29 11/22/2021 04:45 AM    CREATININE 1.10 11/22/2021 04:45 AM    GLUCOSE 98 11/22/2021 04:45 AM    CALCIUM 10.6 11/22/2021 04:45 AM    LABGLOM 53 11/22/2021 04:45 AM        Wt Readings from Last 3 Encounters:   11/21/21 63 kg (138 lb 14.2 oz)   10/08/21 64.9 kg (143 lb)         Intake/Output Summary (Last 24 hours) at 11/22/2021 1202  Last data filed at 11/22/2021 1002  Gross per 24 hour   Intake 60 ml   Output 200 ml   Net -140 ml       Patient seen and examined. Chart reviewed. Labs, data and other pertinent notes reviewed in last 24 hrs.    PMH/SH/FH reviewed and  unchanged compared to H&P    Discussed with patient's husband and RN      Bowen Kia,MD  [facility] ([place])

## 2021-11-22 NOTE — Consults (Signed)
Consult    Patient: Ariel Day MRN: [id number]  SSN: [id number]    Date of Birth: August 29, 1945  Age: 76 y.o.  Sex: female      Subjective:      Ariel Day is a 76 y.o. female who is being seen for evaluation LLE DVT.    Past Medical History:   Diagnosis Date    Breast cancer ([place])     Cardiomyopathy ([place])     EF10%; 65% 2022    Malignant pleural effusion     Orthostatic hypotension      Past Surgical History:   Procedure Laterality Date    CARDIAC DEFIBRILLATOR PLACEMENT Left 2012    CHOLECYSTECTOMY      JOINT REPLACEMENT Bilateral     MASTECTOMY, BILATERAL        Family History   Problem Relation Age of Onset    Heart Disease Mother     Breast Cancer Mother     Heart Disease Father      Social History     Tobacco Use    Smoking status: Never    Smokeless tobacco: Not on file   Substance Use Topics    Alcohol use: Not Currently      Current Facility-Administered Medications   Medication Dose Route Frequency Provider Last Rate Last Admin    potassium chloride 10 mEq/100 mL IVPB (Peripheral Line)  10 mEq IntraVENous Q1H Deep Sherrye Payor, MD 100 mL/hr at 11/22/21 1031 10 mEq at 11/22/21 1031    midodrine (PROAMATINE) tablet 2.5 mg  2.5 mg Oral BID Bleck B Ngwafang, MD   2.5 mg at 11/22/21 0809    sodium chloride flush 0.9 % injection 5-40 mL  5-40 mL IntraVENous 2 times per day Bleck B Ngwafang, MD   10 mL at 11/22/21 0809    sodium chloride flush 0.9 % injection 5-40 mL  5-40 mL IntraVENous PRN Bleck B Ngwafang, MD        0.9 % sodium chloride infusion   IntraVENous PRN Bleck B Ngwafang, MD        ondansetron (ZOFRAN-ODT) disintegrating tablet 4 mg  4 mg Oral Q8H PRN Bleck B Ngwafang, MD        Or    ondansetron (ZOFRAN) injection 4 mg  4 mg IntraVENous Q6H PRN Bleck B Ngwafang, MD   4 mg at 11/22/21 0828    polyethylene glycol (GLYCOLAX) packet 17 g  17 g Oral Daily PRN Bleck B Ngwafang, MD        acetaminophen (TYLENOL) tablet 650 mg  650 mg Oral Q6H PRN Bleck B Ngwafang, MD        Or    acetaminophen (TYLENOL)  suppository 650 mg  650 mg Rectal Q6H PRN Bleck B Ngwafang, MD        predniSONE (DELTASONE) tablet 10 mg  10 mg Oral Daily Bleck B Ngwafang, MD   10 mg at 11/22/21 0809    ceFEPIme (MAXIPIME) 2,000 mg in sodium chloride 0.9 % 50 mL IVPB (mini-bag)  2,000 mg IntraVENous Q24H Bleck B Ngwafang, MD        heparin (porcine) injection 5,700 Units  80 Units/kg IntraVENous PRN Bleck B Ngwafang, MD        heparin (porcine) injection 2,850 Units  40 Units/kg IntraVENous PRN Bleck B Ngwafang, MD        heparin 25,000 units in dextrose 5% 250 mL (premix) infusion  18-30 Units/kg/hr IntraVENous Continuous Bleck B Ngwafang, MD 5 mL/hr at 11/22/21 [phone]  7 Units/kg/hr at 11/22/21 0601        No Known Allergies    Review of Systems:  A comprehensive review of systems was negative except for that written in the History of Present Illness.    Objective:     Vitals:    11/22/21 0945 11/22/21 0948 11/22/21 0956 11/22/21 1000   BP: (!) 83/56 105/74 107/75    Pulse: (!) 128 (!) 133 (!) 125 (!) 114   Resp:       Temp:       TempSrc:       SpO2:       Weight:       Height:            Physical Exam:  General: Patient is pleasant and cooperative and appears to be in no acute distress.  HEENT: Normocephalic atraumatic. Appropriate tracking. No scleral icterus. Nares patent. Trachea is midline.   Chest: Unlabored respirations. Symmetrical chest expansion.   Cardiac: Regular rate and rhythm. Acyanotic  Abdomen: Abdomen is soft, nontender, nondistended.   Extremities: Moves all extremities.  Vascular: LLE larger than R but not tense. No signs of phlegmasia.     Assessment:     LLE DVT from femoral through external iliac    Plan:     Patient has an extensive PMSHx and clear reason for hypercoagulable state.     She has minimal symptoms. She is tolerating heparin drip well.    Discussed at some length with the son.  Her risk of proximal propagation or embolism is not zero but should be significantly lower after stable for 24-48 hours on [facility] which  she appears to be tolerating well.    --Agree with medical management alone at this time. If she fails AC could consider possible intervention but do not favor this at this time. Agree with plan to transition to Eliquis  --Agree with plan for [place] discussion     Vascular will follow peripherally.     Signed By: Lavena Stanford, MD     November 22, 2021

## 2021-11-22 NOTE — Progress Notes (Signed)
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 intake and incoherent speech for the past 24 hours.  She was admitted for further work-up.       Interval history / Subjective:   History, current findings, and plan of care were extensively discussed with patient, her husband at the bedside, and daughter.  Patient had a recent admission to [facility] with bilateral pleural effusions, status post thoracentesis.  Pleural effusions were malignant.  Patient's usual oncologist is Dr. Elinor Parkinson, she has not established follow-up in the [facility] area yet.       Assessment & Plan:        Altered mental status;  Generalized weakness;  Aggressive worsening of functional status;  Severe hypercalcemia   Stage IV breast cancer with metastatic disease to bones and liver;  -Oncology consulted;Currently on oral capecitabine 500 mg BID one week on, one week off.  This has been on hold since early-mid August Hold capecitabine while inpatient  -Patient has well-established follow-up with Dr. Elinor Parkinson;   -rec Palliative medicine per oncologist, palliative care consulted: [place] discussed.     Extensive acute left proximal and distal vein DVT:  -Confirmed on Doppler ultrasound this morning;  -Started  heparin drip on admission;  -Consulted vascular surgery;  -Patient will need lifelong anticoagulation;  -will change to po eliquis tonight after 48 hours of heparin drip: no surgical intervention per VS.     Hypercalcemia of malignancy:  -Initial calcium 12.3, down to 11.3   -nephro consulted"  S/p IV Zometa last night  S/p SQ Calcitonin (total of 4 doses)  Trend Calcium level    Acute cystitis:  -Started on cefepime on admission;  -Follow urine culture results;    Bilateral pleural effusions:  -Malignant;   -Patient had a thoracentesis about 6 weeks ago;  -She is currently on room air and in no acute respiratory distress;   -Discussed with family at the bedside: Will defer thoracentesis at this time, consult IR if respiratory status deteriorates;    Orthostatic hypotension  -Home BP medications held on admission;  Midodrine     Hyponatremia:  -Likely hypovolemic, corrected overnight;    Dysphagia:   Per family  Having issue of solid food for a while per family   -supplement ensure   Speech eval  Code status: full  Prophylaxis: Heparin drip  Care Plan discussed with: patient, husband   at the bedside, RN, Dr. Gorden Harms; code status discussed with husband, he would like to discuss with his daughter.   Anticipated Disposition: home, TBD;            Principal Problem:    AMS (altered mental status)  Active Problems:    Acute encephalopathy    Carcinoma of breast metastatic to bone (HCC)    Complicated UTI (urinary tract infection)    Hypercalcemia    Acute deep vein thrombosis (DVT) of femoral vein of left lower extremity ([place])    Palliative care by specialist    Debility    Neoplastic malignant related fatigue  Resolved Problems:    * No resolved hospital problems. *         Social Determinants of Health     Tobacco Use: Unknown    Smoking Tobacco Use: Never    Smokeless Tobacco Use: Unknown    Passive Exposure: Not on file   Alcohol Use: Not on file   Financial Resource Strain: Not on file   Food Insecurity:  Not on file   Transportation Needs: Not on file   Physical Activity: Not on file   Stress: Not on file   Social Connections: Not on file   Intimate Partner Violence: Not on file   Depression: Not on file   Housing Stability: Not on file       Review of Systems:   Pertinent items are noted in HPI.       Vital Signs:    Last 24hrs VS reviewed since prior progress note. Most recent are:  Vitals:    11/22/21 1400   BP:    Pulse: (!) 104   Resp:    Temp:    SpO2:          Intake/Output Summary (Last 24 hours) at 11/22/2021 1439  Last data filed at 11/22/2021 1002  Gross per 24 hour   Intake 60 ml   Output 200 ml   Net -140 ml          Physical Examination:     I had a face to face encounter with this patient and independently examined them on 11/22/2021 as outlined below:          General : alert x 3, awake, no acute distress,   HEENT: EOMI, dry mucus membrane, no scleroicterus  Neck: supple, no JVD, no meningeal signs  Chest: Clear to auscultation bilaterally, RRR;  CVS: S1 S2 heard, RRR, pacemaker Left upper chest;  Abd: soft/ non tender, non distended, BS physiological,   Ext: LLE larger than R but not tense  Neuro/Psych: pleasant mood and affect, no focal neurological deficits  Skin: warm     Data Review:    Review and/or order of clinical lab test  Review and/or order of tests in the radiology section of CPT  Review and/or order of tests in the medicine section of CPT      I have personally and independently reviewed all pertinent labs, diagnostic studies, imaging, and have provided independent interpretation of the same.     Labs:     Recent Labs     11/21/21  0056 11/22/21  0445   WBC 25.7* 25.8*   HGB 12.9 12.9   HCT 41.1 41.0   PLT 232 244       Recent Labs     11/20/21  1212 11/20/21  1353 11/21/21  0056 11/22/21  0445   NA 131* 135* 136 137   K 6.2* 4.1  4.0 3.8 3.4*   CL 95* 99 96* 94*   CO2 34* 34* 29 34*   BUN 30* 29* 27* 29*   MG 2.1  --   --  1.8   PHOS  --  2.4*  --  2.5*       Recent Labs      11/20/21  1212 11/21/21  0056 11/22/21  0445   ALT 53 51 61   GLOB 4.2* 3.2 3.6       Recent Labs     11/20/21  1918   INR 1.2*   APTT 25.1        No results for input(s): TIBC, FERR in the last 72 hours.    Invalid input(s): FE, PSAT   No results found for: FOL, RBCF   No results for input(s): PH, PCO2, PO2 in the last 72 hours.  No results for input(s): CPK in the last 72 hours.    Invalid input(s): CPKMB, CKNDX, TROIQ  No results found for: CHOL, CHOLX, CHLST, CHOLV, HDL, HDLC, LDL, LDLC, TGLX, TRIGL  No results found for: GLUCPOC  '@LABUA'$ @    Notes reviewed from all clinical/nonclinical/nursing services involved in patient's clinical care. Care coordination discussions were held with appropriate clinical/nonclinical/ nursing providers based on care coordination needs.         Patients current active Medications were reviewed, considered, added and adjusted based on the clinical condition today.      Home Medications were reconciled to the best of my ability given all available resources at the time of admission. Route is PO if not otherwise noted.      Admission Status:[id number]:::1}      Medications Reviewed:     Current Facility-Administered Medications   Medication Dose Route Frequency    midodrine (PROAMATINE) tablet 2.5 mg  2.5 mg Oral BID    sodium chloride flush 0.9 % injection 5-40 mL  5-40 mL IntraVENous 2 times per day    sodium chloride flush 0.9 % injection 5-40 mL  5-40 mL IntraVENous PRN    0.9 % sodium chloride infusion   IntraVENous PRN    ondansetron (ZOFRAN-ODT) disintegrating tablet 4 mg  4 mg Oral Q8H PRN    Or    ondansetron (ZOFRAN) injection 4 mg  4 mg IntraVENous Q6H PRN    polyethylene glycol (GLYCOLAX) packet 17 g  17 g Oral Daily PRN    acetaminophen (TYLENOL) tablet 650 mg  650 mg Oral Q6H PRN    Or    acetaminophen (TYLENOL) suppository 650 mg  650 mg Rectal Q6H PRN    predniSONE (DELTASONE) tablet 10 mg  10 mg Oral Daily    ceFEPIme (MAXIPIME) 2,000 mg in sodium chloride 0.9 % 50 mL  IVPB (mini-bag)  2,000 mg IntraVENous Q24H    heparin (porcine) injection 5,700 Units  80 Units/kg IntraVENous PRN    heparin (porcine) injection 2,850 Units  40 Units/kg IntraVENous PRN    heparin 25,000 units in dextrose 5% 250 mL (premix) infusion  18-30 Units/kg/hr IntraVENous Continuous     ______________________________________________________________________  EXPECTED LENGTH OF STAY: Unable to retrieve estimated LOS  ACTUAL LENGTH OF STAY:          2                 Tania Ade, MD

## 2021-11-23 LAB — CBC
Hematocrit: 39.2 % (ref 35.0–47.0)
Hemoglobin: 12 g/dL (ref 11.5–16.0)
MCH: 32.2 PG (ref 26.0–34.0)
MCHC: 30.6 g/dL (ref 30.0–36.5)
MCV: 105.1 FL — ABNORMAL HIGH (ref 80.0–99.0)
MPV: 12.3 FL (ref 8.9–12.9)
Nucleated RBCs: 0 PER 100 WBC
Platelets: 189 10*3/uL (ref 150–400)
RBC: 3.73 M/uL — ABNORMAL LOW (ref 3.80–5.20)
RDW: 18.5 % — ABNORMAL HIGH (ref 11.5–14.5)
WBC: 18.7 10*3/uL — ABNORMAL HIGH (ref 3.6–11.0)
nRBC: 0 10*3/uL (ref 0.00–0.01)

## 2021-11-23 LAB — COMPREHENSIVE METABOLIC PANEL
ALT: 56 U/L (ref 12–78)
AST: 64 U/L — ABNORMAL HIGH (ref 15–37)
Albumin/Globulin Ratio: 0.7 — ABNORMAL LOW (ref 1.1–2.2)
Albumin: 2.5 g/dL — ABNORMAL LOW (ref 3.5–5.0)
Alk Phosphatase: 280 U/L — ABNORMAL HIGH (ref 45–117)
Anion Gap: 7 mmol/L (ref 5–15)
BUN: 35 MG/DL — ABNORMAL HIGH (ref 6–20)
Bun/Cre Ratio: 31 — ABNORMAL HIGH (ref 12–20)
CO2: 32 mmol/L (ref 21–32)
Calcium: 10.3 MG/DL — ABNORMAL HIGH (ref 8.5–10.1)
Chloride: 95 mmol/L — ABNORMAL LOW (ref 97–108)
Creatinine: 1.14 MG/DL — ABNORMAL HIGH (ref 0.55–1.02)
Est, Glom Filt Rate: 51 mL/min/{1.73_m2} — ABNORMAL LOW (ref >60)
Globulin: 3.4 g/dL (ref 2.0–4.0)
Glucose: 108 mg/dL — ABNORMAL HIGH (ref 65–100)
Potassium: 3.7 mmol/L (ref 3.5–5.1)
Sodium: 134 mmol/L — ABNORMAL LOW (ref 136–145)
Total Bilirubin: 1.1 MG/DL — ABNORMAL HIGH (ref 0.2–1.0)
Total Protein: 5.9 g/dL — ABNORMAL LOW (ref 6.4–8.2)

## 2021-11-23 LAB — HEPARIN, ANTI-XA: Heparin Xa,LMWH and Unfrac: 0.35 IU/mL

## 2021-11-23 LAB — PHOSPHORUS: Phosphorus: 2 MG/DL — ABNORMAL LOW (ref 2.6–4.7)

## 2021-11-23 LAB — MAGNESIUM: Magnesium: 2.1 mg/dL (ref 1.6–2.4)

## 2021-11-23 MED ORDER — METOCLOPRAMIDE HCL 5 MG/ML IJ SOLN
5 MG/ML | Freq: Three times a day (TID) | INTRAMUSCULAR | Status: AC
Start: 2021-11-23 — End: 2021-12-01
  Administered 2021-11-23 – 2021-12-01 (×24): 5 mg via INTRAVENOUS

## 2021-11-23 MED ORDER — PANTOPRAZOLE SODIUM 40 MG IV SOLR
40 MG | INTRAVENOUS | Status: AC
Start: 2021-11-23 — End: 2021-12-01
  Administered 2021-11-23 – 2021-11-30 (×8): 40 mg via INTRAVENOUS

## 2021-11-23 MED ORDER — APIXABAN 5 MG PO TABS
5 MG | Freq: Two times a day (BID) | ORAL | Status: AC
Start: 2021-11-23 — End: 2021-11-29
  Administered 2021-11-23 – 2021-11-30 (×14): 10 mg via ORAL

## 2021-11-23 MED ORDER — SODIUM CHLORIDE 0.9 % IV SOLN
0.9 % | INTRAVENOUS | Status: AC
Start: 2021-11-23 — End: 2021-11-23
  Administered 2021-11-23: 17:00:00 via INTRAVENOUS

## 2021-11-23 MED ORDER — APIXABAN 5 MG PO TABS
5 MG | Freq: Two times a day (BID) | ORAL | Status: AC
Start: 2021-11-23 — End: 2021-12-05
  Administered 2021-11-30 – 2021-12-05 (×7): 5 mg via ORAL

## 2021-11-23 MED FILL — CEFEPIME HCL 2 G IV SOLR: 2 g | INTRAVENOUS | Qty: 2

## 2021-11-23 MED FILL — PREDNISONE 10 MG PO TABS: 10 MG | ORAL | Qty: 1

## 2021-11-23 MED FILL — METOCLOPRAMIDE HCL 5 MG/ML IJ SOLN: 5 MG/ML | INTRAMUSCULAR | Qty: 2

## 2021-11-23 MED FILL — PANTOPRAZOLE SODIUM 40 MG IV SOLR: 40 MG | INTRAVENOUS | Qty: 40

## 2021-11-23 MED FILL — ELIQUIS 5 MG PO TABS: 5 MG | ORAL | Qty: 2

## 2021-11-23 MED FILL — MIDODRINE HCL 5 MG PO TABS: 5 MG | ORAL | Qty: 1

## 2021-11-23 MED FILL — ONDANSETRON HCL 4 MG/2ML IJ SOLN: 4 MG/2ML | INTRAMUSCULAR | Qty: 2

## 2021-11-23 MED FILL — SODIUM CHLORIDE 0.9 % IV SOLN: 0.9 % | INTRAVENOUS | Qty: 1000

## 2021-11-23 NOTE — Progress Notes (Signed)
Name: Ariel Day   MRN: [id number]  DOB: October 15, 1945    Nephrology Progress Note    Assessment:     AKI: Serum Cr 1.'3mg'$ /dl->> improved to 1.'1mg'$ /dl and holding. Suspected pre-renal state.      Hypercalcemia: Peak corrected Ca 13.4. Improving with IV Furosemide/Calcitonin. Corrected Ca today down to 11.5. Etiology likely 2 to metastatic Breast CA.     Combined Systolic/Diastolic CHF: +pulm edema/b/l pleural effusions on presentation     Metastatic Breast CA     Staph Bacteremia-> contaminant?    Hypokalemia    Dysphagia    Plan/Recommendations:  Start gentle IV NS at 50cc/hr x 10hrs  S/p IV Zometa 8/28  S/p SQ Calcitonin (total of 4 doses)  Trend Calcium level  F/u Bld Cx. IV Abx  Overall prognosis guarded/poor-palliative care consulting. Now DNR  AM labs    Subjective:  Sitting up in the chair. Ongoing N/V when she tries to eat per husband and RN.    ROS:   No chest pain, no shortness of breath    Exam:  BP 122/73   Pulse (!) 107   Temp 97.9 F (36.6 C) (Oral)   Resp 18   Ht 1.65 m (5' 4.96")   Wt 63 kg (138 lb 14.2 oz)   SpO2 99%   BMI 23.14 kg/m     Gen: NAD/Elderly/Frail  HEENT: AT/NC  Lungs/Chest wall: Clear. Decreased BS b/l bases. No respiratory distress  Cardiovascular: Regular rate, normal rhythm.   Abdomen/GU: Soft, NT,  BS+  Ext: minimal peripheral edema      Current Facility-Administered Medications   Medication Dose Route Frequency Provider Last Rate Last Admin    apixaban (ELIQUIS) tablet 10 mg  10 mg Oral BID Tania Ade, MD   10 mg at 11/23/21 7829    Followed by    Derrill Memo ON 11/30/2021] apixaban (ELIQUIS) tablet 5 mg  5 mg Oral BID Tania Ade, MD        metoclopramide [facility]) injection 5 mg  5 mg IntraVENous TID Tania Ade, MD   5 mg at 11/23/21 1134    pantoprazole (PROTONIX) 40 mg in sodium chloride (PF) 0.9 % 10 mL injection  40 mg IntraVENous Q24H Tania Ade, MD   40  mg at 11/23/21 1234    0.9 % sodium chloride infusion   IntraVENous Continuous Tomer Chalmers Sherrye Payor, MD 50 mL/hr at 11/23/21 1234 New Bag at 11/23/21 1234    midodrine (PROAMATINE) tablet 2.5 mg  2.5 mg Oral BID Bleck B Ngwafang, MD   2.5 mg at 11/23/21 0855    sodium chloride flush 0.9 % injection 5-40 mL  5-40 mL IntraVENous 2 times per day Bleck B Ngwafang, MD   10 mL at 11/23/21 0855    sodium chloride flush 0.9 % injection 5-40 mL  5-40 mL IntraVENous PRN Bleck B Ngwafang, MD        0.9 % sodium chloride infusion   IntraVENous PRN Bleck B Ngwafang, MD        ondansetron (ZOFRAN-ODT) disintegrating tablet 4 mg  4 mg Oral Q8H PRN Bleck B Ngwafang, MD        Or    ondansetron (ZOFRAN) injection 4 mg  4 mg IntraVENous Q6H PRN Bleck B Ngwafang, MD   4 mg at 11/23/21 0855    polyethylene glycol (GLYCOLAX) packet 17 g  17 g Oral Daily PRN Bleck B Ngwafang, MD        acetaminophen (TYLENOL) tablet 650 mg  650  mg Oral Q6H PRN Bleck B Ngwafang, MD        Or    acetaminophen (TYLENOL) suppository 650 mg  650 mg Rectal Q6H PRN Bleck B Ngwafang, MD        predniSONE (DELTASONE) tablet 10 mg  10 mg Oral Daily Bleck B Ngwafang, MD   10 mg at 11/23/21 0854    ceFEPIme (MAXIPIME) 2,000 mg in sodium chloride 0.9 % 50 mL IVPB (mini-bag)  2,000 mg IntraVENous Q24H Marguarite Arbour, MD   Stopped at 11/22/21 1953        Labs/Data:    Lab Results   Component Value Date    WBC 25.8 (H) 11/22/2021    HGB 12.9 11/22/2021    HCT 41.0 11/22/2021    MCV 104.1 (H) 11/22/2021    PLT 244 11/22/2021       Lab Results   Component Value Date/Time    NA 134 11/23/2021 05:18 AM    K 3.7 11/23/2021 05:18 AM    CL 95 11/23/2021 05:18 AM    CO2 32 11/23/2021 05:18 AM    BUN 35 11/23/2021 05:18 AM    CREATININE 1.14 11/23/2021 05:18 AM    GLUCOSE 108 11/23/2021 05:18 AM    CALCIUM 10.3 11/23/2021 05:18 AM    LABGLOM 51 11/23/2021 05:18 AM        Wt Readings from Last 3 Encounters:   11/21/21 63 kg (138 lb 14.2 oz)   10/08/21 64.9 kg (143 lb)          Intake/Output Summary (Last 24 hours) at 11/23/2021 1238  Last data filed at 11/23/2021 0551  Gross per 24 hour   Intake 150 ml   Output 500 ml   Net -350 ml         Patient seen and examined. Chart reviewed. Labs, data and other pertinent notes reviewed in last 24 hrs.    PMH/SH/FH reviewed and unchanged compared to H&P    Discussed with patient's husband and RN      Vernard Gram,MD  [facility] ([place])

## 2021-11-23 NOTE — Other (Signed)
UPDATED PROGRESS NOTES           Kristopher Oppenheim, MD  Physician  Oncology  Progress Notes      Signed  Date of Service:  11/22/2021  5:36 PM     [place] [facility] [place], [address] [place] [address]  W: [phone]  F: [phone]     Reason for Evaluation:   Ariel Day is a 76 y.o. 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 Dairl Ponder for evaluation of metastatic breast cancer.       Hematology/Oncology Treatment History:   Follows at [facility] with Dr. Stephens November  ER+/PR+ lobular breast cancer diagnosed 12/20/2015 after presenting with multifocal bone mets  Current treatment: oral capecitabine 500 mg BID one week on, one week off.  Has been on hold since early August.       History of Present Illness:   Ariel Day is a pleasant 76 y.o. female who presents today for evaluation of metastatic breast cancer.       Patient admitted with complaints of generalized weakness, decreased PO intake, and confusion. Vitals on admission were stable.  Labs notable for WBC 20.3, left-shifted, Na 131, K 6.2, Cr 1.31, calcium 13.5, AST 112, alk phos 299, albumin 2.6.  CXR showed pulmonary edema with bilateral pleural effusions as well as lytic lesions in scapulae. CT head negative for acute abnormality.  UA with small blood and 1+ bacteria. She was treated with Lasix and empiric cefepime and admitted to medicine.     CT A/P noted large filling defect extending from L femoral vein to inferior portion of IVC, concerning for DVT; multiple hepatic mets; diffuse skeletal mets; and small volume ascites.  Doppler US with acute occlusive thrombus in L external iliac, common femoral, saphenofemoral  junction, profunda femoral, and femoral. Subacute occlusive thrombus in the popliteal and posterior tibial veins. Limited visualization of the peroneal veins but show color filling; cannot exclude non-occlusive thrombus. No DVT in RLE.     At my evaluation, patient remains confused.  She is able to answer some questions but not fully provide history.  Her husband states that they called their oncologist on Friday about the confusion and dehydration, and he advised trying Pedialyte/hydration, and if persisted to present to ER.  She has been off her capecitabine for ~2 weeks.     Interval History:  Patient remains somnolent.  Husband and daughter met with palliative care.  Per daughter, this is significant decline from the last 2-3 weeks.  Calcium levels improving, 10.5 uncorrected.  Stable large pleural effusions.     Per Dr. Nicki Reaper  Cross's outpatient note 08/2021:  a. Initially presented w/ severe LBP to [facility] ER 12/01/15, where plain films were unremarkable but CT of L spine showed "Extensive neoplastic infiltration of the lumbar spine -- metastatic disease versus multiple myeloma. Neoplastic extension into the spinal canal at multiple levels, potentially with significant neoplastic thecal sac compression at L2-L3.  Minor pathologic fractures through the neoplastically infiltrated T12 and L3 vertebral bodies."  b. CT of H/C/A/P (12/03/15): Calvarial lesions but normal brain and upper CT spine; Diffuse neoplastic infiltration of the axial skeleton. In addition to the known diffuse lytic lesions in the lumbar spine, there are additional lytic lesions throughout the thoracic spine, sternum, ribs, and iliac crests. Some of these areas demonstrate cortical breakthrough suggestive of pathologic fractures at T8, T12 and, most notably, L3. As noted on the prior report, the extensive infiltration of the L3 vertebral body encroaches into the spinal canal with resultant stenosis. Diffuse pleural nodularity with multiple bilateral  subpleural nodules. There are associated small bilateral pleural effusions. Main diagnostic considerations for the above findings include metastatic disease or multiple myeloma  c. Diagnostic BMBx (12/06/15) preliminary is negative for overt myeloma, negative flow cytometry, no core and clot section for any increase or restriction in plasma cells.  SPEP w/ IFE, random UPEP w/ IFE, immunoglobulin panel, SFLCA all normal.  d. Repeat diagnostic biopsy of iliac mass (12/08/15) appears consistent with lobular breast cancer, CA 27-29 was 965, CEA 3.8, CA 19-9 just 7.  e. Bone scan (05/09/16) reveals that there has been interval progression of widespread osseous metastatic disease. There is new unusual nonspecific activity about both knees, involving both distal femurs and proximal tibias, right greater than left, also involving the left distal tibia.  This activity is of uncertain etiology and may be related to marrow reaction versus marrow replacement by metastatic disease.  f. X-ray L femur (08/28/16) shows status post left hip arthroplasty with good position of the compartments. No evidence of metastatic disease to the left femur.  g. Pelvis XR (08/28/16) Lucent lesion posterior left iliac crest may represent treated lesion which represented a lytic lesion on prior CT. No definite new lytic lesions. Severe degenerative change right hip. Status post left hip arthroplasty.  h. CT c/a/p (11/24/16) reveals significant interval improvement of pleural-based metastatic disease. Decreased heterogeneity and thickening of the endometrium, though this remains abnormal, suspicious for endometrial neoplasm. Interval sclerosis of osseous metastases, likely healing response. No new lesions identified.  i. Bone scan (11/24/16) documents diffuse osseous metastatic disease, not significantly changed from the prior bone scintigram.  j. Sequence analysis and deletion/duplication testing of the 46 genes (APC, ATM, AXIN2, BARD1, BMPR1A, BRCA1,  BRCA2, BRIP1, CDH1, CDKN2A (p14ARF), CDKN2A (p16INK4a), CHEK2, CTNNA1, DICER1, EPCAM (EPCAM: Deletion/duplication testing only ([id number]).), GREM1 (GREM1: Promoter region deletion/duplication testing only.), KIT, MEN1, MLH1, MSH2, MSH3, MSH6, MUTYH, NBN, NF1, PALB2, PDGFRA, PMS2, POLD1, POLE, PTEN, RAD50, RAD51C, RAD51D, SDHB, SDHC, SDHD, SMAD4, SMARCA4, STK11, TP53, TSC1, TSC2 VHL. The following genes were evaluated for sequence changes only: HOXB13 (c.251G>A, p.Gly84Glu variant only), NTHL1 (NTHL1:  Deletion/duplication analysis is not offered for this gene ([id number]).), SDHA. Results are negative unless otherwise indicated.  k. CT Right lower extremity (08/03/17) shows no convincing evidence of metastatic disease in the right femur. There is a probable small treated metastasis in the right ischium. Advanced right hip DJD. Pattern of sclerosis in the medial and lateral femoral condyles suggesting chronic AVN.  Marland Kitchen CT L-spine (08/03/17)  Extensive osseous metastatic disease having mixed lytic/sclerotic features,  all unchanged compared with body CT from 11/24/2016. No evidence of disease progression. No significant disc disease at any level. CT pelvis 08/03/17) documents pelvic metastatic disease appears stable. Severe right hip osteoarthritis without significant change.  l. CTA (08/14/18) shows no PE or acute aortic syndrome. Bilateral intermediate sized pleural effusions. Extensive osseous metastatic disease within the sternum, vertebral column and ribs. No pathologic fracture. CXR reveals small bilateral pleural effusions. Retrocardiac opacity, which may represent atelectasis or infiltrate. CT right lower extremity reveals severe chronic AVN with head collapse/pathologic fracture and associated severe right hip joint degenerative changes. Findings of bone metastasis with a destructive cortical lesion involving the right iliac bone and 2 small femoral lesions. Whole-body bone scan may be helpful for further  evaluation of the skeleton. 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  m. Radiation Oncology Re-evaluation (08/14/18) for symptomatic bone mets. Palliative radiotherapy recommended to the right femur and right lateral ribcage, completed 09/18/18.  n. Bone scan (09/10/18) Significant increase in multifocal abnormal uptake in keeping with progression of osseous metastatic involvement. Consider CT imaging of the chest/abdomen/pelvis and CT or MR of the head, unless more recently performed cross-sectional imaging studies have been performed at outside institutions (the most recent CT imaging of the chest/abdomen/pelvis available is dated 11/24/2016).  o. CTA chest (09/25/2018) No pulmonary thromboemboli. Small pleural effusions with associated regions of compressive atelectasis. Progressive osseous metastatic disease with multiple new osteolytic metastases as described. There is probably a pathologic fracture associated with the metastasis involving the lateral right fifth rib. The T5 lesion violates the posterior cortex with epidural extension, the degree of encroachment upon the thecal sac is not well assessed by this modality. Thoracic spine MRI could be performed to reevaluate depending on the patient's symptoms.  p. Radiation Oncology Re-evaluation (09/26/18) Palliative radiotherapy recommended to T5 and T6 vertebral bodies over 10 fractions, started 10/08/18.  q. Recommended to rotate to Letrozole and Palbociclib on 10/29/18 due to disease progression (she received radiation to her right hip as well as areas of her spine and ribs immediately prior to this transition).  r. ED to admission for acute cholecystitis (02/21/19) with subsequent lap chole on 11/30. Pathology [phone]) reveals cholecystitis with ulceration. Focal hepatic parenchyma with steatosis. One reactive benign lymph node.  s. Patient resumed therapy with Ibrance (75 mg daily, 3 weeks on and 1 week off) and Letrozole on 04/15/19.   t.  Right femur x-ray (08/15/19) evidence of interval right total hip arthroplasty. No evidence of hardware failure or loosening. Lytic lesion along the posterior aspect of the right mid femoral diaphysis, suspicious for new metastatic lesion, new since 08/03/2017 CT. Diffuse osteopenia. Several additional small lytic osseous metastatic lesions are suspected particularly involving the proximal fibular diaphysis anteriorly, but also seen more diffusely.   u. Bone scan (09/15/19)  Interval progression in number and extent of involvement of diffuse osseous metastatic disease. Only subtle radiotracer uptake adjacent to the predominantly lytic right femoral metadiaphyseal lesion. No findings of hip arthroplasty loosening or instability.  v. RFA to painful lesion in right femur (09/19/19).  w. CT head (09/24/19) No CT evidence of intracranial metastatic disease. However, MRI is more sensitive for detection of small metastases. Extensive lytic lesions throughout the calvarium, may represent metastatic disease or multiple myeloma.   x. CT c/a/p (09/24/19) Interval development of moderate to large right and small-to-moderate left pleural effusions with adjacent atelectasis. Interval development of diffuse hepatic metastatic disease with largest lesion measuring 2.0 cm. Extensive osseous metastatic disease  y. Capecitadine 1500 mg bid, 1 week on and 1 week off, C1D1 on 10/03/19  z. Thoracentesis (10/07/19) Pathology of right pleural fluid ([id number]) Positive for malignancy. Few clusters of atypical cells present, consistent with metastatic breast carcinoma. ER+ (3+, 90%). PR- (0+, 0%). HER-2/NEU negative. (0+).  aa. CXR (10/16/19) Small, but slightly increased, right pleural effusion. Small left pleural effusion similar to prior. Known osseous metastatic disease with expansile left-sided rib lesions.  ab. ED presentation with subsequent admission (07/30/20) for decreased urine output, cough and shortness of breath. Found to be COVID+.   CXR - Left pleural effusion and probable right pleural effusion, not appreciably changed. No evidence of consolidation. CTA chest -  No evidence of pulmonary embolism. Moderately large (right greater than left) pleural effusions with accompanying basilar  atelectasis. Redemonstration of diffuse osseous metastatic disease without superimposed acute complicating feature. CT a/p - Mildly thick-walled urinary bladder, correlation for cystitis may be helpful as appropriate clinically. > No evidence of hydronephrosis or pyelonephritis. No abnormal bowel wall thickening or inflammatory change. Regarding oncologic findings: > Significant interval improvement in the appearance of hepatic metastatic disease. > Diffuse osseous metastatic disease without evidence of acute complicating feature. CT chest (08/01/20)  Moderate-sized pleural effusions, right larger than left appearing very similar to prior  study. > Bibasilar areas of atelectasis. No new pulmonary parenchymal abnormality demonstrated. Imaging stigmata compatible with known metastatic breast cancer to include partial inclusion of known hepatic metastasis as well as osseous metastatic disease without  superimposed acute complicating feature. CXR (08/02/20) No evidence of pneumothorax following right thoracentesis. Persistent small left effusion.  ac. ED to hospital admission (05/09/21 [facility]) in [place] for shortness of breath. CXR - Increased interstitial pulmonary markings at the lower lung fields with associated small pleural effusions, suggestive of fluid overload/mild dependent edema. CTA chest - No pulmonary embolus or aortic dissection. Large bilateral pleural effusions with associated compressive atelectasis. Small amount of ascites in the visualized upper abdomen. Cirrhotic appearing liver. Diffuse skeletal metastatic disease. She underwent US guided thoracentesis while admitted with a total of 600 ml of red colored fluid removal. No  post-procedure pneumothorax on sonogram.     Social History:  Lives in a motor home.  Building a home in [place] was obtained and pertinent findings reviewed above. Past medical history, social history, family history, medications, and allergies are located in the electronic medical record.     Physical Exam:   BP 129/81   Pulse (!) 107   Temp 97.5 F (36.4 C) (Axillary)   Resp 17   Ht 5' 4.96" (1.65 m)   Wt 138 lb 14.2 oz (63 kg)   SpO2 96%   BMI 23.14 kg/m   ECOG PS: 3  General: somnolent   Respiratory: normal respiratory effort,  CV: tachycardic, no peripheral edema  Ext: warm, well perfused, calf asymmetry (L>R)  GI: soft, nontender, nondistended, no masses  Skin: no rashes, no ecchymoses, no petechiae     Results:            Lab Results   Component Value Date/Time     WBC 25.8 11/22/2021 04:45 AM     HGB 12.9 11/22/2021 04:45 AM     HCT 41.0 11/22/2021 04:45 AM     PLT 244 11/22/2021 04:45 AM     MCV 104.1 11/22/2021 04:45 AM            Lab Results   Component Value Date/Time  NA 137 11/22/2021 04:45 AM     K 3.4 11/22/2021 04:45 AM     CL 94 11/22/2021 04:45 AM     CO2 34 11/22/2021 04:45 AM     BUN 29 11/22/2021 04:45 AM            Lab Results   Component Value Date/Time     ALT 61 11/22/2021 04:45 AM     GLOB 3.6 11/22/2021 04:45 AM      Records from Epic reviewed and summarized above.  Test results above have been reviewed.     Imaging:      CT Result (most recent):  CT ABDOMEN PELVIS W IV CONTRAST 11/20/2021     Narrative  EXAM: CT ABDOMEN PELVIS W IV CONTRAST     INDICATION: abd pain w/ AMS and N/V     COMPARISON: 10/07/2021     CONTRAST: 100 mL of Isovue-370.     ORAL CONTRAST: None     TECHNIQUE:  Following the intravenous administration of intravenous contrast, axial images  were obtained through the abdomen and pelvis. Coronal and sagittal  reconstructions were generated. CT dose reduction was achieved through use of a  standardized protocol tailored for this  examination and automatic exposure  control for dose modulation.     FINDINGS:  There are moderate-sized bilateral pleural effusions with adjacent compressive  atelectasis.     A 2.4 cm cyst is noted in the right hepatic lobe.  There are multiple masses in  the liver are again visualized compatible with diffuse metastatic disease.  One  of the largest is located in the posterior right hepatic lobe measures up to 2.5  cm (series 3 image number 46).   The spleen enhances appropriately.   The  pancreas enhances homogeneously. No discrete masses.  The gallbladder is  surgically absent.  No significant biliary dilatation.     The adrenal glands appear normal.     The kidneys enhance symmetrically. No hydronephrosis.     Visualized portions of the bladder are grossly unremarkable.  Pelvic structures  are not well visualized because of metallic streak artifact from bilateral hip  arthritis hardware.     The uterus is present.     There is a small volume of ascites in the abdomen.  There is no free air.  There  are no abscesses.     No significant lymphadenopathy.     No evidence of intestinal obstruction.  The appendix appears normal.     Aorta is normal in caliber.  There is apparent extensive filling defect from the left femoral vein to the  very inferior aspect of the inferior vena cava compatible with large DVT.     Diffuse skeletal metastatic disease is again noted.     Impression  1.  Large filling defect extending from the left femoral vein to the inferior  portion of the inferior vena cava concerning for deep venous thrombosis.     2.  Multiple hepatic masses are again noted compatible with hepatic metastatic  disease.     3.  Diffuse skeletal metastases are again visualized.     4.  Small volume of ascites.        The findings were conveyed to [facility]  via perfectserve at 11/20/2021 5:54 PM  by Denna Haggard.  Bernice        Imaging personally reviewed by me     Assessment:   1) Metastatic lobular breast cancer  (ER+/PR+)  Follows  with Dr. Stephens November at [facility].  Sites of disease include diffuse bone mets, liver mets, and malignant pleural effusion. Currently on oral capecitabine 500 mg BID one week on, one week off.  It appears this was started in 2021 per his notes.  This has been on hold since early-mid August.     GOC ongoing.  Greatly appreciate Palliative Care expertise. Patient's daughter and husband share that patient appears to have had a rather dramatic clinical decline over short period of time.  Family understand that prognosis is guarded, especially in setting of hypercalcemia of malignancy, which is often a marker of advanced disease, but family would like to give a little more time for acute interventions (e.g. improvement of calcium and UTI) prior to making decisions about palliative-based care.  Patient's daughter plans to reach out to Dr. Elinor Parkinson to update him on current clinical status.  Would defer final treatment decisions to Dr. Elinor Parkinson, but patient is not currently a candidate for cancer-directed therapy.  We also discussed that pending clinical course, it may be reasonable to consider PleurX given malignant pleural effusion but this would largely depend upon acute recovery.   - Hold capecitabine     #) Hypercalcemia of malignancy, improving:  Presented with calcium 12.3, corrected 13.5  - Status post calcitonin 4 units/kg x 4 doses and Zometa x 1 dose.  Calcium improved to corrected 11.7.  Remains somnolent, in part due to ongoing hypercalcemia  - Lasix per medicine  - Nephrology following     #) Metabolic encephalopathy: due to hypercalcemia and UTI     #) Extensive acute left proximal and distal vein DVT:  Incidentally noted on CT A/P with filling defect from L femoral vein extending to IVC.  Follow up Doppler with extensive DVT throughout L external ilac to femoral vein and subacute occlusive thrombus in popliteal and posterior tibial veins.  Risk factors: a) metastatic breast cancer  -  Currently on heparin drip.  Plan to switch to Eliquis with load this evening.  Recommend lifelong AC     #) Acute on chronic heart failure: Lasix per medicine     #) Acute cystitis: antibiotics per medicine     #) Orthostatic hypotension: management per medicine     #) Adrenal insufficiency: on chronic prednisone 10 mg daily     Oncology will follow along as needed. Thank you for involving me in the care of this patient.     Signed By: Kristopher Oppenheim, MD                      ED to Hosp-Admission (Current) on 11/20/2021    ED to Hosp-Admission (Current) on 11/20/2021      Detailed Report    Note shared with patient  Progress Notes Info    Author Note Status Last Update User Last Update Date/Time   Kristopher Oppenheim, MD Signed Kristopher Oppenheim, MD 11/22/2021  5:46 PM     Chart Review Routing History    No routing history on file.  Care Timeline    08/27   Admitted from ED 2203

## 2021-11-23 NOTE — Progress Notes (Signed)
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 intake and incoherent speech for the past 24 hours.  She was admitted for further work-up.       Interval history / Subjective:   Very weak   Still intermittent nausea and vomiting, not tolerate food        Assessment & Plan:        Altered mental status;  Generalized weakness;  Aggressive worsening of functional status;  Severe hypercalcemia   Stage IV breast cancer with metastatic disease to bones and liver;  -Oncology consulted;Currently on oral capecitabine 500 mg BID one week on, one week off.  This has been on hold since early-mid August Hold capecitabine while inpatient  -Patient has well-established follow-up with Dr. Elinor Parkinson;   -rec Palliative medicine per oncologist, palliative care consulted: Chilo discussed.     Nausea and vomiting  -poor oral intake for a while  -ABD ct reviewed, no bowel obstrution, has liver mets   -will start small dose ATC reglan       Extensive acute left proximal and distal vein DVT:  -Confirmed on Doppler ultrasound this morning;  -Started heparin drip on admission;  -Consulted vascular surgery;  -Patient will need lifelong anticoagulation;  -changed to po eliquis 8/30 AM after 48 hours of heparin drip: no surgical  intervention per VS.     Hypercalcemia of malignancy:  -Initial calcium 12.3, down to 11.3   -nephro consulted"  S/p IV Zometa last night  S/p SQ Calcitonin (total of 4 doses)  Trend Calcium level  Renal following     Acute cystitis:  -Started on cefepime on admission;  -Follow urine culture results;  Blood culture 8/27 1/2 CNS likely contamination   Will give 5 days of tx, dc abx on 8/31     Bilateral pleural effusions:  -Malignant;   -Patient had a thoracentesis about 6 weeks ago;  -She is currently on room air and in no acute respiratory distress;   -Discussed with family at the bedside: Will defer thoracentesis at this time, consult IR if respiratory status deteriorates;  -[place] 8/30 noticed adequate pleural effusion to be drained if indicated: if decide to home with home hospice, may consider one time tap prior discharge if increasing tachypnea     Orthostatic hypotension  -Home BP medications  held on admission;  Midodrine     Hyponatremia:  -Likely hypovolemic, corrected overnight;    Dysphagia:   Per family  Having issue of solid food for a while per family   -supplement ensure   Speech eval        Code status: f DNR   Prophylaxis: eliquis   Care Plan discussed with: patient, husband   at the bedside, RN, Dr. Gorden Harms;     Anticipated Disposition: home, TBD;            Principal Problem:    AMS (altered mental status)  Active Problems:    Acute encephalopathy    Carcinoma of breast metastatic to bone (HCC)    Complicated UTI (urinary tract infection)    Hypercalcemia    Acute deep vein thrombosis (DVT) of femoral vein of left lower extremity ([place])    Palliative care by specialist    Debility    Neoplastic malignant related fatigue  Resolved Problems:    * No resolved hospital problems. *         Social Determinants of Health     Tobacco Use: Unknown    Smoking Tobacco Use: Never    Smokeless Tobacco Use: Unknown    Passive Exposure: Not on file   Alcohol Use: Not on file   Financial Resource Strain: Not on  file   Food Insecurity: Not on file   Transportation Needs: Not on file   Physical Activity: Not on file   Stress: Not on file   Social Connections: Not on file   Intimate Partner Violence: Not on file   Depression: Not on file   Housing Stability: Not on file       Review of Systems:   Pertinent items are noted in HPI.       Vital Signs:    Last 24hrs VS reviewed since prior progress note. Most recent are:  Vitals:    11/23/21 1000   BP:    Pulse: (!) 104   Resp:    Temp:    SpO2:          Intake/Output Summary (Last 24 hours) at 11/23/2021 1116  Last data filed at 11/23/2021 0551  Gross per 24 hour   Intake 150 ml   Output 500 ml   Net -350 ml          Physical Examination:     I had a face to face encounter with this patient and independently examined them on 11/23/2021 as outlined below:          General : awake, lethargy answer questions, mild confusion    HEENT: EOMI, dry mucus membrane, no scleroicterus  Neck: supple, no JVD, no meningeal signs  Chest: Clear to auscultation bilaterally, RRR;  CVS: S1 S2 heard, RRR, pacemaker Left upper chest;  Abd: soft/ non tender, non distended, BS physiological,   Ext: LLE larger than R but not tense  Neuro/Psych: pleasant mood and affect, no focal neurological deficits  Skin: warm     Data Review:    Review and/or order of clinical lab test  Review and/or order of tests in the radiology section of CPT  Review and/or order of tests in the medicine section of CPT      I have personally and independently reviewed all pertinent labs, diagnostic studies, imaging, and have provided independent interpretation of the same.     Labs:     Recent Labs     11/21/21  0056  11/22/21  0445   WBC 25.7* 25.8*   HGB 12.9 12.9   HCT 41.1 41.0   PLT 232 244       Recent Labs     11/20/21  1212 11/20/21  1353 11/21/21  0056 11/22/21  0445 11/23/21  0518   NA 131* 135* 136 137 134*   K 6.2* 4.1  4.0 3.8 3.4* 3.7   CL 95* 99 96* 94* 95*   CO2 34* 34* 29 34* 32   BUN 30* 29* 27* 29* 35*   MG 2.1   --   --  1.8 2.1   PHOS  --  2.4*  --  2.5* 2.0*       Recent Labs     11/21/21  0056 11/22/21  0445 11/23/21  0518   ALT 51 61 56   GLOB 3.2 3.6 3.4       Recent Labs     11/20/21  1918   INR 1.2*   APTT 25.1        No results for input(s): TIBC, FERR in the last 72 hours.    Invalid input(s): FE, PSAT   No results found for: FOL, RBCF   No results for input(s): PH, PCO2, PO2 in the last 72 hours.  No results for input(s): CPK in the last 72 hours.    Invalid input(s): CPKMB, CKNDX, TROIQ  No results found for: CHOL, CHOLX, CHLST, CHOLV, HDL, HDLC, LDL, LDLC, TGLX, TRIGL  No results found for: GLUCPOC  '@LABUA'$ @    Notes reviewed from all clinical/nonclinical/nursing services involved in patient's clinical care. Care coordination discussions were held with appropriate clinical/nonclinical/ nursing providers based on care coordination needs.         Patients current active Medications were reviewed, considered, added and adjusted based on the clinical condition today.      Home Medications were reconciled to the best of my ability given all available resources at the time of admission. Route is PO if not otherwise noted.      Admission Status:[id number]:::1}      Medications Reviewed:     Current Facility-Administered Medications   Medication Dose Route Frequency    apixaban (ELIQUIS) tablet 10 mg  10 mg Oral BID    Followed by    Derrill Memo ON 11/30/2021] apixaban (ELIQUIS) tablet 5 mg  5 mg Oral BID    metoclopramide (REGLAN) injection 5 mg  5 mg IntraVENous TID    midodrine (PROAMATINE) tablet 2.5 mg  2.5 mg Oral BID    sodium chloride flush 0.9 % injection 5-40 mL  5-40 mL IntraVENous 2 times per day    sodium chloride flush 0.9 % injection 5-40 mL  5-40 mL IntraVENous PRN    0.9 % sodium chloride infusion   IntraVENous PRN    ondansetron (ZOFRAN-ODT) disintegrating tablet 4 mg  4 mg Oral Q8H PRN    Or    ondansetron (ZOFRAN) injection 4 mg  4 mg IntraVENous Q6H PRN    polyethylene glycol (GLYCOLAX) packet 17 g  17 g Oral  Daily PRN    acetaminophen (TYLENOL) tablet 650 mg  650 mg Oral Q6H PRN    Or    acetaminophen (TYLENOL) suppository 650 mg  650 mg Rectal Q6H PRN    predniSONE (DELTASONE) tablet 10 mg  10 mg Oral Daily    ceFEPIme (MAXIPIME) 2,000 mg in sodium chloride 0.9 % 50 mL IVPB (mini-bag)  2,000 mg IntraVENous Q24H     ______________________________________________________________________  EXPECTED LENGTH OF STAY: Unable to retrieve estimated LOS  ACTUAL LENGTH OF STAY:          3                 Tania Ade, MD

## 2021-11-23 NOTE — Care Coordination-Inpatient (Addendum)
Care Management Initial Assessment       RUR:19%  Readmission? No  1st IM letter given? Yes - 11/23/21  1st Tricare letter given: N/A    Transition of Care Plan:    Prior Level of Functioning: total care  Disposition: TBD. Palliative following for plan of care.  If SNF or IPR: Date FOC offered:   Date FOC received:   Accepting facility:   Date authorization started with reference number:   Date authorization received and expires:   Follow up appointments:   DME needed:   Transportation at discharge: BLS  IM/IMM Medicare/Tricare letter given: Yes  Is patient a Veteran and connected with VA?    If yes, was [employer] transfer form completed and VA notified?   Caregiver Contact:   Discharge Caregiver contacted prior to discharge?   Care Conference needed?   Barriers to discharge:       Reason for Admission:  Stage IV breast cancer with mets to bones and liver    Consults: palliative, oncology, nephrology                         Plan for utilizing home health: N/A         PCP: First and Last name:  Ariel Fortis MD   Name of Practice: [facility] [place]   Are you a current patient: Yes   Approximate date of last visit:     Current Advanced Directive/Advance Care Plan: DNR     Healthcare Decision Maker:   Click here to complete [place] including selection of the Healthcare Decision Maker Relationship (ie "Primary")             Primary Decision Maker: Ariel Day,Ariel Day - Spouse - [phone]                  CM unable to meet with patient who is sedated.  CM met with patient's husband at the bedside. Patient lives with her husband in a mobile home on daughter's property (plan was for additional room for daughter's house). Patient and husband moved to [place] from [place] about 6 months ago. Patient has daughter in [place] and son in [place]. Patient was total care prior to admission. Patient's husband confirmed PCP, health insurance, and prescription coverage.            11/23/21  1226   Service Assessment   Patient Orientation Sedated   Cognition Severely Impaired   History Provided By Significant Other   Primary Caregiver Spouse   Support Systems Spouse/Significant Other;Children   Patient's Healthcare Decision Maker is: Named in [place]    PCP Verified by CM Yes   Prior Functional Level Assistance with the following:;Bathing;Dressing;Toileting;Feeding;Cooking;Housework   Current Functional Level Assistance with the following:;Bathing;Dressing;Toileting;Feeding;Cooking;Housework   Can patient return to prior living arrangement Unknown at present   Ability to make needs known: Unable   Family able to assist with home care needs: Other (comment)  (TBD)   Would you like for me to discuss the discharge plan with any other family members/significant others, and if so, who? No   Financial Resources [employer] None

## 2021-11-23 NOTE — Plan of Care (Signed)
Problem: Physical Therapy - Adult  Goal: By Discharge: Performs mobility at highest level of function for planned discharge setting.  See evaluation for individualized goals.  Description: FUNCTIONAL STATUS PRIOR TO ADMISSION:  Patient unable to provide history at evaluation.  Per her husband, she has had a gradual progressive decline for about a year, increasing significantly for a few weeks prior to admission.  She has required assistance for all ADLs, although still contributing, and her mobility has declined from ambulating with RW to primarily using a wheelchair.    HOME SUPPORT: Patient lives with husband who provides assistance with all mobility and ADLs.   They have been building an accessible addition to live at their daughter's house and have been staying in a motor home/ RV on daughter's property in the meantime.    Physical Therapy Goals  Initiated 11/22/2021  1.  Patient will move from supine to sit and sit to supine, scoot up and down, and roll side to side in bed with minA within 7 day(s).    2.  Patient will perform sit to stand with minA within 7 day(s).  3.  Patient will transfer from bed to chair and chair to bed with minA using the least restrictive device within 7 day(s).  4.  Patient will ambulate with moderate assistance for 15 feet with the least restrictive device within 7 day(s).     11/23/2021 1444 by Annamary Rummage, PT  Outcome: Progressing  11/23/2021 1314 by Annamary Rummage, PT  Outcome: Progressing       PHYSICAL THERAPY TREATMENT-AFTERNOON SESSION    Patient: Ariel Day (76 y.o. female)  Date: 11/23/2021  Diagnosis: Hypercalcemia [E83.52]  Abnormal LFTs [R79.89]  Acute encephalopathy [F81.01]  Complicated UTI (urinary tract infection) [N39.0]  Carcinoma of breast metastatic to bone, unspecified laterality ([place]) [C50.919, C79.51]  AMS (altered mental status) [R41.82] AMS (altered mental status)      Precautions:                      ASSESSMENT:  Patient continues to benefit  from skilled PT services and is progressing towards goals. Patient needing increased assist with stand pivot transfer after sitting for an hour in the chair.  Patient may benefit from snf if family cannot provide physical assist patient will need at discharge.          SUBJECTIVE:   Patient stated, "That was nice."    OBJECTIVE DATA SUMMARY:     Functional Mobility Training for afternoon session:  Bed Mobility:  Bed Mobility Training  Bed Mobility Training: Yes  Rolling: Moderate assistance  Supine to Sit: Moderate assistance  Sit to Supine: Moderate assistance  Scooting: Maximum assistance  Transfers:  Pharmacologist: Yes  Sit to Stand: Moderate assistance  Stand Pivot Transfers: Maximum assistance  Balance:  Balance  Sitting - Static: Fair (occasional)  Sitting - Dynamic: Fair (occasional)  Standing - Static: Constant support;Poor             Pain Rating:  No complaints of pain     Activity Tolerance:   Fair     After treatment:   Patient left in no apparent distress in bed, Caregiver / family present, and Side rails x3      COMMUNICATION/EDUCATION:   The patient's plan of care was discussed with: registered nurse    Patient Education  Education Given To: Patient  Education Provided: Plan of Care  Education Outcome: Continued education needed  Annamary Rummage, PT  Minutes: [phone]

## 2021-11-23 NOTE — Plan of Care (Signed)
Problem: Physical Therapy - Adult  Goal: By Discharge: Performs mobility at highest level of function for planned discharge setting.  See evaluation for individualized goals.  Description: FUNCTIONAL STATUS PRIOR TO ADMISSION:  Patient unable to provide history at evaluation.  Per her husband, she has had a gradual progressive decline for about a year, increasing significantly for a few weeks prior to admission.  She has required assistance for all ADLs, although still contributing, and her mobility has declined from ambulating with RW to primarily using a wheelchair.    HOME SUPPORT: Patient lives with husband who provides assistance with all mobility and ADLs.   They have been building an accessible addition to live at their daughter's house and have been staying in a motor home/ RV on daughter's property in the meantime.    Physical Therapy Goals  Initiated 11/22/2021  1.  Patient will move from supine to sit and sit to supine, scoot up and down, and roll side to side in bed with minA within 7 day(s).    2.  Patient will perform sit to stand with minA within 7 day(s).  3.  Patient will transfer from bed to chair and chair to bed with minA using the least restrictive device within 7 day(s).  4.  Patient will ambulate with moderate assistance for 15 feet with the least restrictive device within 7 day(s).     Outcome: Progressing       PHYSICAL THERAPY TREATMENT    Patient: Ariel Day (76 y.o. female)  Date: 11/23/2021  Diagnosis: Hypercalcemia [E83.52]  Abnormal LFTs [R79.89]  Acute encephalopathy [O17.51]  Complicated UTI (urinary tract infection) [N39.0]  Carcinoma of breast metastatic to bone, unspecified laterality ([place]) [C50.919, C79.51]  AMS (altered mental status) [R41.82] AMS (altered mental status)      Precautions:     fall                 ASSESSMENT:  Patient continues to benefit from skilled PT services and is progressing towards goals. Patient needing mod assist for stand pivot transfer, but able to  demonstrate weight bearing through bilateral lower ext and able to achieve full upright stance. Husband present who stated that she had been requiring heavy assist for transfers recently at home.            PLAN:  Patient continues to benefit from skilled intervention to address the above impairments.  Continue treatment per established plan of care.    Recommendation for discharge: (in order for the patient to meet his/her long term goals): Therapy up to 5 days/week in Skilled nursing facility    Other factors to consider for discharge: high risk for falls and not safe to be alone    IF patient discharges home will need the following DME: hospital bed       SUBJECTIVE:   Patient stated, "Thank you."    OBJECTIVE DATA SUMMARY:   Critical Behavior:      Patient received supine with purewick, husband present  Chart reviewed and nursing consulted prior to treatment    Functional Mobility Training:  Bed Mobility:  Bed Mobility Training  Rolling: Moderate assistance  Supine to Sit: Moderate assistance  Transfers:  Pharmacologist: Yes  Sit to Stand: Moderate assistance  Stand Pivot Transfers: Moderate assistance  Balance:  Balance  Sitting - Static: Fair (occasional)  Sitting - Dynamic: Fair (occasional)  Standing - Static: Constant support;Poor         Pain Rating:  No complaints of pain, willing to participate with therapy   Pain Intervention(s):   pain is at a level acceptable to the patient    Activity Tolerance:   Fair     After treatment:   Patient left in no apparent distress sitting up in chair and Caregiver / family present      COMMUNICATION/EDUCATION:   The patient's plan of care was discussed with: registered nurse    Patient Education  Education Given To: Family  Education Provided: Role of Therapy  Education Outcome: Continued education needed      [place], PT  Minutes: 24

## 2021-11-23 NOTE — Plan of Care (Signed)
Problem: Skin/Tissue Integrity  Goal: Absence of new skin breakdown  Description: 1.  Monitor for areas of redness and/or skin breakdown  2.  Assess vascular access sites hourly  3.  Every 4-6 hours minimum:  Change oxygen saturation probe site  4.  Every 4-6 hours:  If on nasal continuous positive airway pressure, respiratory therapy assess nares and determine need for appliance change or resting period.  Outcome: Progressing

## 2021-11-24 LAB — COMPREHENSIVE METABOLIC PANEL
ALT: 52 U/L (ref 12–78)
AST: 54 U/L — ABNORMAL HIGH (ref 15–37)
Albumin/Globulin Ratio: 0.6 — ABNORMAL LOW (ref 1.1–2.2)
Albumin: 2.2 g/dL — ABNORMAL LOW (ref 3.5–5.0)
Alk Phosphatase: 269 U/L — ABNORMAL HIGH (ref 45–117)
Anion Gap: 6 mmol/L (ref 5–15)
BUN: 40 MG/DL — ABNORMAL HIGH (ref 6–20)
Bun/Cre Ratio: 35 — ABNORMAL HIGH (ref 12–20)
CO2: 30 mmol/L (ref 21–32)
Calcium: 9.3 MG/DL (ref 8.5–10.1)
Chloride: 97 mmol/L (ref 97–108)
Creatinine: 1.15 MG/DL — ABNORMAL HIGH (ref 0.55–1.02)
Est, Glom Filt Rate: 50 mL/min/{1.73_m2} — ABNORMAL LOW (ref >60)
Globulin: 3.7 g/dL (ref 2.0–4.0)
Glucose: 98 mg/dL (ref 65–100)
Potassium: 3.3 mmol/L — ABNORMAL LOW (ref 3.5–5.1)
Sodium: 133 mmol/L — ABNORMAL LOW (ref 136–145)
Total Bilirubin: 0.8 MG/DL (ref 0.2–1.0)
Total Protein: 5.9 g/dL — ABNORMAL LOW (ref 6.4–8.2)

## 2021-11-24 LAB — CBC
Hematocrit: 35.7 % (ref 35.0–47.0)
Hemoglobin: 11.3 g/dL — ABNORMAL LOW (ref 11.5–16.0)
MCH: 32.5 PG (ref 26.0–34.0)
MCHC: 31.7 g/dL (ref 30.0–36.5)
MCV: 102.6 FL — ABNORMAL HIGH (ref 80.0–99.0)
MPV: 10.7 FL (ref 8.9–12.9)
Nucleated RBCs: 0 PER 100 WBC
Platelets: 166 10*3/uL (ref 150–400)
RBC: 3.48 M/uL — ABNORMAL LOW (ref 3.80–5.20)
RDW: 17.6 % — ABNORMAL HIGH (ref 11.5–14.5)
WBC: 19.7 10*3/uL — ABNORMAL HIGH (ref 3.6–11.0)
nRBC: 0 10*3/uL (ref 0.00–0.01)

## 2021-11-24 LAB — PHOSPHORUS: Phosphorus: 1.3 MG/DL — ABNORMAL LOW (ref 2.6–4.7)

## 2021-11-24 LAB — MAGNESIUM: Magnesium: 1.8 mg/dL (ref 1.6–2.4)

## 2021-11-24 MED ORDER — PROCHLORPERAZINE MALEATE 10 MG PO TABS
10 MG | Freq: Four times a day (QID) | ORAL | Status: AC | PRN
Start: 2021-11-24 — End: 2021-12-05
  Administered 2021-11-25 – 2021-11-26 (×3): 5 mg via ORAL

## 2021-11-24 MED ORDER — SODIUM CHLORIDE 0.9 % IV SOLN
0.9 % | Freq: Once | INTRAVENOUS | Status: AC
Start: 2021-11-24 — End: 2021-11-24
  Administered 2021-11-24: 13:00:00 20 mmol via INTRAVENOUS

## 2021-11-24 MED FILL — ONDANSETRON HCL 4 MG/2ML IJ SOLN: 4 MG/2ML | INTRAMUSCULAR | Qty: 2

## 2021-11-24 MED FILL — METOCLOPRAMIDE HCL 5 MG/ML IJ SOLN: 5 MG/ML | INTRAMUSCULAR | Qty: 2

## 2021-11-24 MED FILL — ELIQUIS 5 MG PO TABS: 5 MG | ORAL | Qty: 2

## 2021-11-24 MED FILL — PREDNISONE 10 MG PO TABS: 10 MG | ORAL | Qty: 1

## 2021-11-24 MED FILL — POTASSIUM PHOSPHATES 150 MMOL/50 ML IV SOLN: 150 MMOLE/50ML | INTRAVENOUS | Qty: 6.67

## 2021-11-24 MED FILL — ACETAMINOPHEN 650 MG RE SUPP: 650 MG | RECTAL | Qty: 1

## 2021-11-24 MED FILL — MIDODRINE HCL 5 MG PO TABS: 5 MG | ORAL | Qty: 1

## 2021-11-24 MED FILL — PANTOPRAZOLE SODIUM 40 MG IV SOLR: 40 MG | INTRAVENOUS | Qty: 40

## 2021-11-24 NOTE — Progress Notes (Signed)
Name: Ariel Day   MRN: [id number]  DOB: 05-21-1945    Nephrology Progress Note    Assessment:     AKI: Serum Cr 1.'3mg'$ /dl->> improved to 1.'1mg'$ /dl and holding. Suspected pre-renal state.      Hypercalcemia: Peak corrected Ca 13.4. Improving with Calcitonin/Zometa (8/28). Corrected Ca today down to 10.7. Etiology likely 2 to metastatic Breast CA.     Combined Systolic/Diastolic CHF: +pulm edema/b/l pleural effusions on presentation     Metastatic Breast CA     Staph Bacteremia-> contaminant?    Hypokalemia/Hypophosphatemia    Dysphagia    Plan/Recommendations:  IV Kphos 47mol x1 dose  Ct to encourage intake as tolerated  Trend Calcium level  Overall prognosis guarded/poor-palliative care consulting. Now DNR  AM labs    Subjective:  Husband at bedside. Patient resting comfortably. He states she is now tolerating Ensure.     ROS:   Deferred    Exam:  BP 138/74   Pulse (!) 104   Temp 97.5 F (36.4 C) (Axillary)   Resp 18   Ht 1.65 m (5' 4.96")   Wt 63 kg (138 lb 14.2 oz)   SpO2 98%   BMI 23.14 kg/m     Gen: NAD/Elderly/Frail  HEENT: AT/NC  Lungs/Chest wall: Clear. Decreased BS b/l bases. No respiratory distress  Cardiovascular: Regular rate, normal rhythm.   Abdomen/GU: Soft, NT,  BS+  Ext: No peripheral edema      Current Facility-Administered Medications   Medication Dose Route Frequency Provider Last Rate Last Admin    potassium phosphate 20 mmol in sodium chloride 0.9 % 250 mL IVPB  20 mmol IntraVENous Once Keinan Brouillet PSherrye Payor MD 62.5 mL/hr at 11/24/21 0832 20 mmol at 11/24/21 01950   apixaban (ELIQUIS) tablet 10 mg  10 mg Oral BID YTania Ade MD   10 mg at 11/24/21 0827    Followed by    [Derrill MemoON 11/30/2021] apixaban (ELIQUIS) tablet 5 mg  5 mg Oral BID YTania Ade MD        metoclopramide ([facility] injection 5 mg  5 mg IntraVENous TID YTania Ade MD   5 mg at 11/24/21 0827     pantoprazole (PROTONIX) 40 mg in sodium chloride (PF) 0.9 % 10 mL injection  40 mg IntraVENous Q24H YTania Ade MD   40 mg at 11/23/21 1234    midodrine (PROAMATINE) tablet 2.5 mg  2.5 mg Oral BID Bleck B Ngwafang, MD   2.5 mg at 11/24/21 0826    sodium chloride flush 0.9 % injection 5-40 mL  5-40 mL IntraVENous 2 times per day Bleck B Ngwafang, MD   10 mL at 11/24/21 0827    sodium chloride flush 0.9 % injection 5-40 mL  5-40 mL IntraVENous PRN Bleck B Ngwafang, MD        0.9 % sodium chloride infusion   IntraVENous PRN Bleck B Ngwafang, MD        ondansetron (ZOFRAN-ODT) disintegrating tablet 4 mg  4 mg Oral Q8H PRN Bleck B Ngwafang, MD        Or    ondansetron (ZOFRAN) injection 4 mg  4 mg IntraVENous Q6H PRN Bleck B Ngwafang, MD   4 mg at 11/24/21 0827    polyethylene glycol (GLYCOLAX) packet 17 g  17 g Oral Daily PRN Bleck B Ngwafang, MD        acetaminophen (TYLENOL) tablet 650 mg  650 mg Oral Q6H PRN Bleck B Ngwafang, MD  Or    acetaminophen (TYLENOL) suppository 650 mg  650 mg Rectal Q6H PRN Bleck B Ngwafang, MD        predniSONE (DELTASONE) tablet 10 mg  10 mg Oral Daily Bleck B Ngwafang, MD   10 mg at 11/24/21 0826    ceFEPIme (MAXIPIME) 2,000 mg in sodium chloride 0.9 % 50 mL IVPB (mini-bag)  2,000 mg IntraVENous Q24H Marguarite Arbour, MD   Stopped at 11/23/21 2345        Labs/Data:    Lab Results   Component Value Date    WBC 19.7 (H) 11/24/2021    HGB 11.3 (L) 11/24/2021    HCT 35.7 11/24/2021    MCV 102.6 (H) 11/24/2021    PLT 166 11/24/2021       Lab Results   Component Value Date/Time    NA 133 11/24/2021 05:30 AM    K 3.3 11/24/2021 05:30 AM    CL 97 11/24/2021 05:30 AM    CO2 30 11/24/2021 05:30 AM    BUN 40 11/24/2021 05:30 AM    CREATININE 1.15 11/24/2021 05:30 AM    GLUCOSE 98 11/24/2021 05:30 AM    CALCIUM 9.3 11/24/2021 05:30 AM    LABGLOM 50 11/24/2021 05:30 AM        Wt Readings from Last 3 Encounters:   11/21/21 63 kg (138 lb 14.2 oz)   10/08/21 64.9 kg (143 lb)         Intake/Output  Summary (Last 24 hours) at 11/24/2021 1215  Last data filed at 11/24/2021 0945  Gross per 24 hour   Intake 150 ml   Output --   Net 150 ml         Patient seen and examined. Chart reviewed. Labs, data and other pertinent notes reviewed in last 24 hrs.    PMH/SH/FH reviewed and unchanged compared to H&P    Discussed with patient's husband       Chipper Koudelka,MD  [facility]

## 2021-11-24 NOTE — Progress Notes (Signed)
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 intake and incoherent speech for the past 24 hours.  She was admitted for further work-up.    Interval history / Subjective:   Saw the patient on rounds. She feels weak and is very nauseated      Assessment & Plan:     Generalized weakness;  worsening of functional status;  Severe hypercalcemia: Improved  Stage IV breast cancer with metastatic disease to bones and liver;  - Oncology consulted;Currently on oral capecitabine 500 mg BID one week on, one week off.  This has been on hold since early-mid August Hold capecitabine while inpatient  - Patient has well-established follow-up with Dr. Elinor Parkinson;   - Palliative care following. Had a discussion today with husband and daughter stephanie. As of now their goals are to return the patient to a functional level that she can return home where they can further consider hospice care.     Nausea and vomiting  - poor oral intake for a while likely 2/2 metastatic disease  - CT Abd:no bowel obstrution, has liver mets   - PRN zofran, added compazine today    Extensive acute left proximal and distal vein DVT:  - Confirmed on Doppler ultrasound  - S/P heparin gtt,   - Vascular saw the patient, no  intervention at this time, continue [facility]  - Patient will need lifelong anticoagulation;   -Transitioned to po eliquis 8/30 AM. Continue loading dose of '10mg'$  BID x 2 weeks (EOT 9/5) then '5mg'$  BID indefinitely    Hypercalcemia of malignancy: improved  - Initial calcium 12.3, now 9.3  -nephro following  - S/p IV Zometa last night, S/p SQ Calcitonin (total of 4 doses)     Acute cystitis:  - Started on cefepime on admission;  - Follow urine culture results;  - Blood culture 8/27 1/2 CNS likely contamination   - Stopped Cefepime today (8/31) received 5 days ABX     Bilateral pleural effusions:  - Malignant;   - Patient had a thoracentesis about 6 weeks ago;  - She is currently on room air and in no acute respiratory distress;   - Discussed with family at the bedside: Will defer thoracentesis at this time, consult IR if respiratory status deteriorates;  - [place] 8/30 noticed adequate pleural effusion  to be drained if indicated: if decide to home with home hospice, may consider one time tap prior discharge if increasing tachypnea      Orthostatic hypotension  - Home BP medications held on admission;  - continue Midodrine      Hyponatremia:  - Likely hypovolemic, corrected overnight;     Dysphagia:   Per family  Having issue of solid food for a while per family   -supplement ensure     Code status:   Prophylaxis:   Care Plan discussed with:   Anticipated Disposition:            Review of Systems:   Pertinent items are noted in HPI.       Vital Signs:    Last 24hrs VS reviewed since prior progress note. Most recent are:  Vitals:    11/24/21 1400   BP:    Pulse: (!) 109   Resp:    Temp:    SpO2:          Intake/Output Summary (Last 24 hours) at 11/24/2021 1626  Last data filed at 11/24/2021 0945  Gross per 24 hour   Intake 150 ml   Output --   Net 150 ml        Physical Examination:     I had a face to face encounter with this patient and independently examined them on 11/24/2021 as outlined below:          Constitutional:  No acute  distress, ill appearing, lethargic   ENT:  Oral mucosa moist, oropharynx benign.    Resp:  CTA bilaterally. No wheezing/rhonchi/rales. No accessory muscle use.    CV:  Regular rhythm, normal rate, no murmurs, gallops, rubs    GI:  Soft, non distended, non tender. normoactive bowel sounds, no hepatosplenomegaly     Musculoskeletal:  LLE mildly larger than RLE, warm, 2+ pulses throughout    Neurologic:  Moves all extremities.  AAOx3, CN II-XII Grossly intact            Data Review:    Review and/or order of clinical lab test  Review and/or order of tests in the radiology section of CPT  Review and/or order of tests in the medicine section of CPT      Labs:     Recent Labs     11/23/21  0518 11/24/21  0530   WBC 18.7* 19.7*   HGB 12.0 11.3*   HCT 39.2 35.7   PLT 189 166     Recent Labs     11/22/21  0445 11/23/21  0518 11/24/21  0530   NA 137 134* 133*   K 3.4* 3.7 3.3*   CL 94* 95* 97   CO2 34* 32 30   BUN 29* 35* 40*   MG 1.8 2.1 1.8   PHOS 2.5* 2.0* 1.3*     Recent Labs     11/22/21  0445 11/23/21  0518 11/24/21  0530   ALT 61 56 52   GLOB 3.6 3.4 3.7     No results for input(s): INR, APTT in the last 72 hours.    Invalid input(s): PTP   No results for input(s): TIBC, FERR in the last 72 hours.    Invalid input(s): FE, PSAT   No results found for: FOL, RBCF   No results for input(s): PH, PCO2, PO2 in the last 72 hours.  No results for input(s): CPK in the last 72 hours.    Invalid input(s): CPKMB,  CKNDX, TROIQ  No results found for: CHOL, CHOLX, CHLST, CHOLV, HDL, HDLC, LDL, LDLC, TGLX, TRIGL  No results found for: GLUCPOC  '@LABUA'$ @      Medications Reviewed:     Current Facility-Administered Medications   Medication Dose Route Frequency    apixaban (ELIQUIS) tablet 10 mg  10 mg Oral BID    Followed by    Derrill Memo ON 11/30/2021] apixaban (ELIQUIS) tablet 5 mg  5 mg Oral BID    metoclopramide (REGLAN) injection 5 mg  5 mg IntraVENous TID    pantoprazole (PROTONIX) 40 mg in sodium chloride (PF) 0.9 % 10 mL injection  40 mg  IntraVENous Q24H    midodrine (PROAMATINE) tablet 2.5 mg  2.5 mg Oral BID    sodium chloride flush 0.9 % injection 5-40 mL  5-40 mL IntraVENous 2 times per day    sodium chloride flush 0.9 % injection 5-40 mL  5-40 mL IntraVENous PRN    0.9 % sodium chloride infusion   IntraVENous PRN    ondansetron (ZOFRAN-ODT) disintegrating tablet 4 mg  4 mg Oral Q8H PRN    Or    ondansetron (ZOFRAN) injection 4 mg  4 mg IntraVENous Q6H PRN    polyethylene glycol (GLYCOLAX) packet 17 g  17 g Oral Daily PRN    acetaminophen (TYLENOL) tablet 650 mg  650 mg Oral Q6H PRN    Or    acetaminophen (TYLENOL) suppository 650 mg  650 mg Rectal Q6H PRN    predniSONE (DELTASONE) tablet 10 mg  10 mg Oral Daily    ceFEPIme (MAXIPIME) 2,000 mg in sodium chloride 0.9 % 50 mL IVPB (mini-bag)  2,000 mg IntraVENous Q24H     ______________________________________________________________________  EXPECTED LENGTH OF STAY: 5  ACTUAL LENGTH OF STAY:          4                 Burlene Montecalvo M Cary Lothrop, PA-C

## 2021-11-24 NOTE — Progress Notes (Signed)
Music Therapy Assessment  ST. Aristes [id number]     12/04/1945  76 y.o.  female    Patient Telephone Number: [phone] (home)   Religious Affiliation: Mormon   Language: English   Patient Active Problem List    Diagnosis Date Noted    Confusion 11/24/2021    Nausea 11/24/2021    Acute encephalopathy 11/21/2021    Carcinoma of breast metastatic to bone ([place]) 85/27/7824    Complicated UTI (urinary tract infection) 11/21/2021    Hypercalcemia 11/21/2021    Acute deep vein thrombosis (DVT) of femoral vein of left lower extremity ([place]) 11/21/2021    Palliative care by specialist 11/21/2021    Debility 11/21/2021    Neoplastic malignant related fatigue 11/21/2021    AMS (altered mental status) 11/20/2021    Pleural effusion 10/07/2021        Date: 11/24/2021            Total Time Calculated: 50 min          [facility] 2N MED SURG    Mental Status:   '[x]'  Alert [  ] Forgetful [  ]  Confused  [  ] Minimally responsive  [  ] Sleeping    Communication Status: [  ] Impaired Speech [  ] Nonverbal -N/A    Physical Status:   [  ] Oxygen in use  [  ] Hard of Hearing [  ] Vision Impaired  [  ] Ambulatory  [  ] Ambulatory with assistance [  ] Non-ambulatory -N/A    Music Preferences, Background: Country, especially [name] and [place]. The pt and her spouse went on a cruise on which [place] was featured, and they enjoyed meeting the band. The pt is related to Carlena Hurl family.    Clinical Problem addressed: Relaxation and comfort; Nausea relief.    Goal(s) met in session:  Physical/Pain management (Scale of 1-10):    Pre-session rating: Pt denied pain but reported nausea.  Post-session rating: Pt didn't report on pain. She reported her feelings of nausea had decreased.  [  ] Increased relaxation   [  ] Affected breathing patterns  [  ] Decreased muscle tension   [  ] Decreased agitation  [  ] Affected heart rate    [  ] Increased alertness     Emotional/Psychological:  [  ] Increased self-expression   [  ]  Decreased aggressive behavior   [  ] Decreased feelings of stress  [  ] Discussed healthy coping skills     [  ] Improved mood    [  ] Decreased withdrawn behavior     Social:  [  ] Decreased feelings of isolation/loneliness '[x]'  Positive social interaction   '[x]'  Provided support and/or comfort for family/friends    Spiritual:  [  ] Spiritual support    [  ] Expressed peace  [  ] Expressed faith    [  ] Discussed beliefs    Techniques Utilized (Check all that apply):   [  ] Procedural support MT '[x]'  Music for relaxation [  ] Patient preferred music  [  ] Lyric analysis  [  ] Song choice  [  ] Music for validation  [  ] Entrainment  [  ] Movement to music '[x]'  Guided visualization  [  ] ISO Principle  [  ] Patient instrument playing [  ] Song writing  [  ] Sing  along   [  ] Improvisation  [  ] Sensory stimulation  '[x]'  Active Listening  [  ] Music for spiritual support [  ] Making of CDs as gifts    Session Observations:  Referral from [employer]. Patient (pt) was alert lying in bed. Her spouse Rush Landmark was at bedside. This music therapist (MT) entered the pt's room with the pt's nurse, who was there to administer medication. The pt reported having nausea. The MT briefly explained role and offered music therapy, which the pt and her spouse accepted. MT sat at bedside and softly played the acoustic guitar at a moderately slow tempo to support a calm environment. While playing, the MT spoke the pt through deep breathing and mindfulness exercises to relax the pt's body and mind, and to take her mind's focus off her nausea and put it on pleasant stimuli (the music and her breath). The pt increased relaxation in response to the music experience, as evidenced by (AEB) closing her eyes, her breathing slowing and deepening, and the pt subtly moving certain muscle groups according to the MT's verbalized suggestions to help release muscle tension. As the music concluded, the pt opened her eyes and said the experience made  her feel peaceful. Her spouse commented he thought she might have fallen asleep at certain points. They thanked the MT for the session.    Mindi Slicker, MT-BC (Music Therapist-Board Certified)  Spiritual Care Department  Referral-based service

## 2021-11-24 NOTE — Plan of Care (Signed)
Problem: Physical Therapy - Adult  Goal: By Discharge: Performs mobility at highest level of function for planned discharge setting.  See evaluation for individualized goals.  Description: FUNCTIONAL STATUS PRIOR TO ADMISSION:  Patient unable to provide history at evaluation.  Per her husband, she has had a gradual progressive decline for about a year, increasing significantly for a few weeks prior to admission.  She has required assistance for all ADLs, although still contributing, and her mobility has declined from ambulating with RW to primarily using a wheelchair.    HOME SUPPORT: Patient lives with husband who provides assistance with all mobility and ADLs.   They have been building an accessible addition to live at their daughter's house and have been staying in a motor home/ RV on daughter's property in the meantime.    Physical Therapy Goals  Initiated 11/22/2021  1.  Patient will move from supine to sit and sit to supine, scoot up and down, and roll side to side in bed with minA within 7 day(s).    2.  Patient will perform sit to stand with minA within 7 day(s).  3.  Patient will transfer from bed to chair and chair to bed with minA using the least restrictive device within 7 day(s).  4.  Patient will ambulate with moderate assistance for 15 feet with the least restrictive device within 7 day(s).     11/24/2021 1528 by Annamary Rummage, PT  Outcome: Progressing  11/24/2021 1246 by Annamary Rummage, PT  Outcome: Progressing     PHYSICAL THERAPY TREATMENT-AFTERNOON SESSION    Patient: Ariel Day (76 y.o. female)  Date: 11/24/2021  Diagnosis: Hypercalcemia [E83.52]  Abnormal LFTs [R79.89]  Acute encephalopathy [P95.09]  Complicated UTI (urinary tract infection) [N39.0]  Carcinoma of breast metastatic to bone, unspecified laterality ([place]) [C50.919, C79.51]  AMS (altered mental status) [R41.82] AMS (altered mental status)      Precautions:                      ASSESSMENT:  Patient continues to benefit  from skilled PT services and is slowly progressing towards goals. Patient is still requiring max assist for transfers.  Patient was able to tolerate sitting in chair for one hour before requesting to return to bed from fatigue.          SUBJECTIVE:   Patient stated, "Thank you."    OBJECTIVE DATA SUMMARY:     Functional Mobility Training for afternoon session:  Bed Mobility:  Bed Mobility Training  Bed Mobility Training: Yes  Rolling: Moderate assistance  Supine to Sit: Moderate assistance  Sit to Supine: Maximum assistance  Scooting: Total assistance  Transfers:  Transfer Training  Stand to Sit: Maximum assistance  Stand Pivot Transfers: Maximum assistance  Bed to Chair: Maximum assistance  Balance:  Balance  Sitting - Static: Good (unsupported)  Sitting - Dynamic: Fair (occasional)  Standing: Impaired  Standing - Static: Constant support (max assist to briefly achieve stance)         No complaints of pain   Pain Intervention(s):   repositioning    Activity Tolerance:   Fair     After treatment:   Patient left in no apparent distress in bed, Call bell within reach, and Caregiver / family present      COMMUNICATION/EDUCATION:   The patient's plan of care was discussed with: registered nurse    Patient Education  Education Given To: Patient  Education Provided: Plan of Care  Education  Outcome: Continued education needed      Annamary Rummage, PT  Minutes: [phone]

## 2021-11-24 NOTE — Progress Notes (Signed)
Patient is complaining if nausea. she has been receiving Zofran without much relief, compazine is ordered but it's a pill and she's refusing saying she knows she will throw up. She would also lilke something to help her sleep, no preference on what she gets because she hasn't used anything in years and can't remember what she was given in the past. i told her very low possibility of getting anything but i would ask.   will contact hospitalist to ask,    Can her compazine be changed to IV?  Can she get something mild to help with sleep?

## 2021-11-24 NOTE — Progress Notes (Signed)
Doing rounds in [place].  Patient and husband were present during the visit.  The husband and patient shared about their strong faith.  Husband shared how their daughters were providing support to them at this time.  Actively listened to their faith journey.  Patient was interested in music therapy.   Advised of chaplain availability.  Advised nurse to contact [facility] for any further referrals.    Roxana  [facility] at [phone]

## 2021-11-24 NOTE — Plan of Care (Signed)
Problem: Safety - Adult  Goal: Free from fall injury  Outcome: Progressing  Flowsheets (Taken 11/24/2021 1041)  Free From Fall Injury: Instruct family/caregiver on patient safety

## 2021-11-24 NOTE — Plan of Care (Signed)
Problem: Physical Therapy - Adult  Goal: By Discharge: Performs mobility at highest level of function for planned discharge setting.  See evaluation for individualized goals.  Description: FUNCTIONAL STATUS PRIOR TO ADMISSION:  Patient unable to provide history at evaluation.  Per her husband, she has had a gradual progressive decline for about a year, increasing significantly for a few weeks prior to admission.  She has required assistance for all ADLs, although still contributing, and her mobility has declined from ambulating with RW to primarily using a wheelchair.    HOME SUPPORT: Patient lives with husband who provides assistance with all mobility and ADLs.   They have been building an accessible addition to live at their daughter's house and have been staying in a motor home/ RV on daughter's property in the meantime.    Physical Therapy Goals  Initiated 11/22/2021  1.  Patient will move from supine to sit and sit to supine, scoot up and down, and roll side to side in bed with minA within 7 day(s).    2.  Patient will perform sit to stand with minA within 7 day(s).  3.  Patient will transfer from bed to chair and chair to bed with minA using the least restrictive device within 7 day(s).  4.  Patient will ambulate with moderate assistance for 15 feet with the least restrictive device within 7 day(s).     Outcome: Progressing         PHYSICAL THERAPY TREATMENT    Patient: Ariel Day (76 y.o. female)  Date: 11/24/2021  Diagnosis: Hypercalcemia [E83.52]  Abnormal LFTs [R79.89]  Acute encephalopathy [D63.87]  Complicated UTI (urinary tract infection) [N39.0]  Carcinoma of breast metastatic to bone, unspecified laterality ([place]) [C50.919, C79.51]  AMS (altered mental status) [R41.82] AMS (altered mental status)      Precautions:                      ASSESSMENT:  Patient continues to benefit from skilled PT services and is slowly progressing towards goals. Patient was needing max assist with transfers per her  husband, still needing max assist for bed to chair transfer.  Patient may need skilled nursing facility because of the assist she requires at this time.   Patient following commands and participating with therapy, just requiring additional time for responses.          PLAN:  Patient continues to benefit from skilled intervention to address the above impairments.  Continue treatment per established plan of care.    Recommendation for discharge: (in order for the patient to meet his/her long term goals): Therapy up to 5 days/week in Skilled nursing facility    Other factors to consider for discharge: patient's current support system is unable to meet their requirements for physical assistance for an extended period    IF patient discharges home will need the following DME: hospital bed       SUBJECTIVE:   Patient stated, "ok."    OBJECTIVE DATA SUMMARY:   Critical Behavior:          Functional Mobility Training:  Bed Mobility:  Bed Mobility Training  Rolling: Moderate assistance  Supine to Sit: Moderate assistance  Scooting: Maximum assistance  Transfers:  Transfer Training  Stand to Sit: Maximum assistance  Stand Pivot Transfers: Maximum assistance  Bed to Chair: Maximum assistance  Balance:  Balance  Sitting - Static: Good (unsupported)  Sitting - Dynamic: Fair (occasional)  Standing: Impaired  Standing - Static: Constant support (  max assist to briefly achieve stance)    Patient sat edge of bed while reaching across midline for functional activity with stand by assist    Pain Rating:  No complaints of pain   Pain Intervention(s):   pain is at a level acceptable to the patient    Activity Tolerance:   Fair     After treatment:   Patient left in no apparent distress sitting up in chair and Call bell within reach      COMMUNICATION/EDUCATION:   The patient's plan of care was discussed with: occupational therapist    Patient Education  Education Given To: Patient  Education Provided: Plan of Care  Education Outcome:  Continued education needed      [employer], PT  Minutes: 23

## 2021-11-24 NOTE — Progress Notes (Signed)
Palliative Medicine Consult  [place]: [phone])    Patient Name: Ariel Day  Date of Birth: April 18, 1945    Date of Initial Consult: 11/21/2021  Date of Today's Visit: 11/24/2021  Reason for Consult: care decisions  Requesting Provider: Dr. Dairl Ponder      SUMMARY:   Inza Mikrut is a 76 y.o. with a past history of metastatic breast cancer (dx 2017 w bone mets), long hx of treatments followed by Dr. Stephens November at [facility] (current tx capecitabine), hx liver/ bone/ malignant pleural effusion [facility] 10/07/2021 temporary pleural drain), Hx chronic diastolic CHF s/p ICD 3244 EF 10% in past, improved to 65% in 2022 , adrenal insufficiency, orthostatic hypotension , who was admitted on 11/20/2021 from home with a diagnosis of weakness, fatigue, no longer able to walk, AMS.     Current medical issues leading to Palliative Medicine involvement include: patient being treated for hypercalcemia of malignancy (corrected calcium 13.5 on admission)  UTI and also acute LLE DVT extending into Inferior Vena Cava  CT Abd: 11/20/2021   1.  Large filling defect extending from the left femoral vein to the inferior  portion of the inferior vena cava concerning for deep venous thrombosis.  2.  Multiple hepatic masses are again noted compatible with hepatic metastatic  disease   3.  Diffuse skeletal metastases are again visualized  4.  Small volume of ascites.    ECHO 8/27 : EF 65-70%    Social: married to spouse Tila Millirons for over 68 years.  They have 3 adult children (Dtr, [place], local, dtr [place], in [place] (family practice MD) and son, [name] in [place])  6 grandchildren in [place].   Recent move from [facility] to [place] to live near dtr. Currently in mobile home while inlaw suite under construction (ready this week they hope)  Patient and spouse have traveled extensively in the last 15 years, going to all 4 states in motor home.  They have spent many winters in [place], [place].  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  Spouse notes that patient has had ongoing functional decline in past 6 months, needing more assistance with ADL care       PALLIATIVE DIAGNOSES:   Metabolic encephalopathy  Hypercalcemia of malignancy, breast cancer  Acute cystitis  LLE DVT extending into IVC  Pleural effusions, bilateral R>L  Diffuse skeletal metastatic disease  Ascites  Functional decline, weakness, cancer related fatigue  Poor appetite, weight loss   Palliative medicine encounter       PLAN:   Along w/ Theodosia Quay LCSW meet w/ pt and husband. Our team met earlier this week and discussed long course of treatment and started to consider Hospice care.   With regard to acute medical issues, hypercalcemia and AKI improving. Tolerating some Ensure. More alert. However overall Hospice would still support pt and family well.   Local dtr Colletta Baxter was going to talk w/ long time oncologist Dr Elinor Parkinson w/ [facility] in [place] before a hospice decisions made- she cannot come in today and left voicemail message.   If stabilizes before a decision about hospice can be made, could f/up with Oncology and our clinic. Will need DDNR before discharge.   Symptoms:   Holding off on repeat thoracentesis as nl work to breathe on room air.   Continue Reglan.   Denies pain.   ADVANCE CARE PLANNING / TREATMENT PREFERENCES:     GOALS OF CARE:  '[]' -Comfort   '[]' -Cure   '[]' -  Prolong life   '[x]' -Recovery from acute illness   '[]' -Rehabilitation  '[x]' -Other:         TREATMENT PREFERENCES:     Patient and family's personal goals include: pt unable to state    Important upcoming milestones or family events: they have been adding to dtr's house, hoping to move in this week (currently in their mobile home in the driveway)     The patient identifies the following as important for living well: pt enjoys traveling, being on the go all the time, spending time with family    Advance Care Planning:  '[x]'  The [place] Team has updated the ACP Navigator with [place] and Patient [place]     The patient has appointed the following active healthcare agents:      The Patient has the following current code status:    Code Status: DNR         Other:    As far as possible, the palliative care team has discussed with patient / health care proxy about goals of care / treatment preferences for patient.     HISTORY:     History obtained from: chart, spouse    CHIEF COMPLAINT: weakness, confusion    HPI/SUBJECTIVE:    The patient is:   '[x]'  Verbal and participatory  '[]'  Non-participatory due to: delirium    More alert, able to answer simple questions. Husband at bedside.        ROS / FUNCTIONAL ASSESSMENT:     Palliative Performance Scale (PPS)  PPS: 30         Modified-[place] Symptom Assessment Scale (ESAS)  Pain Score: No pain  Dyspnea Score: No shortness of breath    Clinical Pain Assessment  Severity: 0               PSYCHOSOCIAL/SPIRITUAL SCREENING:     Palliative IDT has assessed this patient for cultural preferences / practices and a referral made as appropriate to needs Orthoptist, Patient Advocacy, Ethics, etc.)    Spiritual affiliation was reviewed as documented by palliative care chaplain.      Any spiritual / religious / cultural beliefs and practices that will affect your patient's care?:  '[]'  Yes /  '[x]'  No   If "Yes" to discuss with pastoral care during IDT     Does caregiver feel burdened by caring for their loved one:   '[]'  Yes /  '[x]'  No /  '[]'  No Caregiver Present/Available '[]'  No Caregiver '[]'  Pt Lives at Facility  If "Yes" to discuss with social work during IDT    Anticipatory grief assessment:   '[x]'  Normal  / '[]'  Maladaptive     If "Maladaptive" to discuss with social work during [place]:     From Therapist, sports flowsheet:  Wt Readings from Last 3 Encounters:   11/21/21 138 lb 14.2 oz (63 kg)   10/08/21 143 lb (64.9 kg)     Blood pressure 126/83, pulse (!) 109, temperature 97.3 F (36.3 C), temperature source Oral, resp.  rate 18, height 5' 4.96" (1.65 m), weight 138 lb 14.2 oz (63 kg), SpO2 97 %.    Last bowel movement, if known:     Constitutional: pale, chronically ill appearing, sleeping NAD     HISTORY:     Principal Problem:    AMS (altered mental status)  Active Problems:    Acute encephalopathy    Carcinoma  of breast metastatic to bone [facility])    Complicated UTI (urinary tract infection)    Hypercalcemia    Acute deep vein thrombosis (DVT) of femoral vein of left lower extremity ([place])    Palliative care by specialist    Debility    Neoplastic malignant related fatigue  Resolved Problems:    * No resolved hospital problems. *    Past Medical History:   Diagnosis Date    Breast cancer ([place])     Cardiomyopathy ([place])     EF10%; 65% 2022    Malignant pleural effusion     Orthostatic hypotension       Past Surgical History:   Procedure Laterality Date    CARDIAC DEFIBRILLATOR PLACEMENT Left 2012    CHOLECYSTECTOMY      JOINT REPLACEMENT Bilateral     MASTECTOMY, BILATERAL        Family History   Problem Relation Age of Onset    Heart Disease Mother     Breast Cancer Mother     Heart Disease Father       History reviewed, no pertinent family history.  Social History     Tobacco Use    Smoking status: Never    Smokeless tobacco: Not on file   Substance Use Topics    Alcohol use: Not Currently     No Known Allergies   Current Facility-Administered Medications   Medication Dose Route Frequency    apixaban (ELIQUIS) tablet 10 mg  10 mg Oral BID    Followed by    Derrill Memo ON 11/30/2021] apixaban (ELIQUIS) tablet 5 mg  5 mg Oral BID    metoclopramide (REGLAN) injection 5 mg  5 mg IntraVENous TID    pantoprazole (PROTONIX) 40 mg in sodium chloride (PF) 0.9 % 10 mL injection  40 mg IntraVENous Q24H    midodrine (PROAMATINE) tablet 2.5 mg  2.5 mg Oral BID    sodium chloride flush 0.9 % injection 5-40 mL  5-40 mL IntraVENous 2 times per day    sodium chloride flush 0.9 % injection 5-40 mL  5-40 mL IntraVENous PRN    0.9 % sodium chloride infusion    IntraVENous PRN    ondansetron (ZOFRAN-ODT) disintegrating tablet 4 mg  4 mg Oral Q8H PRN    Or    ondansetron (ZOFRAN) injection 4 mg  4 mg IntraVENous Q6H PRN    polyethylene glycol (GLYCOLAX) packet 17 g  17 g Oral Daily PRN    acetaminophen (TYLENOL) tablet 650 mg  650 mg Oral Q6H PRN    Or    acetaminophen (TYLENOL) suppository 650 mg  650 mg Rectal Q6H PRN    predniSONE (DELTASONE) tablet 10 mg  10 mg Oral Daily    ceFEPIme (MAXIPIME) 2,000 mg in sodium chloride 0.9 % 50 mL IVPB (mini-bag)  2,000 mg IntraVENous Q24H          LAB AND IMAGING FINDINGS:     Lab Results   Component Value Date/Time    WBC 19.7 11/24/2021 05:30 AM    HGB 11.3 11/24/2021 05:30 AM    PLT 166 11/24/2021 05:30 AM     Lab Results   Component Value Date/Time    NA 133 11/24/2021 05:30 AM    K 3.3 11/24/2021 05:30 AM    CL 97 11/24/2021 05:30 AM    CO2 30 11/24/2021 05:30 AM    BUN 40 11/24/2021 05:30 AM    MG 1.8 11/24/2021 05:30 AM    PHOS  1.3 11/24/2021 05:30 AM      Lab Results   Component Value Date/Time    GLOB 3.7 11/24/2021 05:30 AM     Lab Results   Component Value Date/Time    INR 1.2 11/20/2021 07:18 PM    APTT 25.1 11/20/2021 07:18 PM      No results found for: IRON, TIBC, IBCT, FERR   No results found for: PH, PCO2, PO2  No components found for: GLPOC   No results found for: CPK, CKMB, TROPONINI           Only check if applicable and billing time based rather than MDM    '[x]'    The total encounter time on this service date was 35 minutes which was spent performing a face-to-face encounter and personally completing the provider-level activities documented in the note.  This includes time spent prior to the visit and after the visit in direct care of the patient.  This time does not include time spent in any separately reportable services.

## 2021-11-24 NOTE — Plan of Care (Signed)
Problem: Occupational Therapy - Adult  Goal: By Discharge: Performs self-care activities at highest level of function for planned discharge setting.  See evaluation for individualized goals.  Description: FUNCTIONAL STATUS PRIOR TO ADMISSION:  Patient unable to provide history at evaluation.  Per her husband, she has had a gradual progressive decline for about a year, increasing significantly for a few weeks prior to admission.  She has required assistance for all ADLs, although still contributing, and her mobility has declined from ambulating with RW to primarily using a wheelchair.    HOME SUPPORT: Patient resided with her husband to provide assistance.   They have been building an accessible addition to live at their daughter's house and have been staying in a motor home/ RV in the meantime.    Occupational Therapy Goals:  Initiated 11/22/2021  1.  Patient will perform grooming with Minimal Assist within 7 day(s).  2.  Patient will perform self-feeding with Minimal Assist within 7 day(s).  3.  Patient will perform bathing with Moderate Assist within 7 day(s).  4.  Patient will perform toilet transfers with Minimal Assist  within 7 day(s).  5.  Patient will perform all aspects of toileting with Moderate Assist within 7 day(s).  6.  Patient will perform UB dressing with minimal assist within 7 days.  7.  Patient will perform LB dressing with moderate assist within 7 days.  8.  Patient will participate in upper extremity therapeutic exercise/activities with Supervision for 10 minutes within 7 day(s).      Outcome: Progressing   OCCUPATIONAL THERAPY TREATMENT  Patient: Ariel Day (76 y.o. female)  Date: 11/24/2021  Primary Diagnosis: Hypercalcemia [E83.52]  Abnormal LFTs [R79.89]  Acute encephalopathy [E70.35]  Complicated UTI (urinary tract infection) [N39.0]  Carcinoma of breast metastatic to bone, unspecified laterality ([place]) [C50.919, C79.51]  AMS (altered mental status) [R41.82]       Precautions:                   Chart, occupational therapy assessment, plan of care, and goals were reviewed.    ASSESSMENT  Patient continues to benefit from skilled OT services and is slowly progressing towards goals. Patient continues to exhibit altered mental status, however relative improvement noted today with patient tolerating several ADL activities while seated EOB. Patient benefits from [facility] for sitting balance while brushing her teeth and washing her face, requiring directive cues for task initiation and problem-solving. Patient HR up to 126 bpm with EOB activity. Given significant physical assist needed at this time, SNF rehab most appropriate. If patient were to return home, would need to ensure environment accessible and necessary DME available for use to maximize safety of patient and caregivers.           PLAN :  Patient continues to benefit from skilled intervention to address the above impairments.  Continue treatment per established plan of care to address goals.    Recommendation for discharge: (in order for the patient to meet his/her long term goals): Therapy up to 5 days/week in Skilled nursing facility; If home, would require [place] as well as 24/7 supervision and heavy physical assistance.    Other factors to consider for discharge: patient's current support system is unable to meet their requirements for physical assistance and concern for safely navigating or managing the home environment    IF patient discharges home will need the following DME: hospital bed and mechanical lift       SUBJECTIVE:   Patient stated "That should  be fine" after therapist assists in cutting meat on lunch tray.    OBJECTIVE DATA SUMMARY:   Cognitive/Behavioral Status:  Orientation  Orientation Level: Oriented to person;Oriented to place  Cognition  Arousal/Alertness: Delayed responses to stimuli (Increased time for processing.)  Following Commands: Follows one step commands with repetition  Attention Span: Attends with cues to  redirect  Memory: Decreased short term memory  Problem Solving: Decreased awareness of errors;Assistance required to identify errors made;Assistance required to correct errors made  Insights: Decreased awareness of deficits  Initiation: Requires cues for all  Sequencing: Requires cues for some    Functional Mobility and Transfers for ADLs:  Bed Mobility:  Bed Mobility Training  Bed Mobility Training: Yes  Rolling: Moderate assistance  Supine to Sit: Moderate assistance  Sit to Supine: Maximum assistance  Scooting: Total assistance     Balance:  Standing: Impaired  Balance  Sitting - Static: Good (unsupported)  Sitting - Dynamic: Fair (occasional)  Standing: Impaired  Standing - Static: Constant support (max assist to briefly achieve stance)    ADL Intervention:    Feeding: Minimal assistance;Increased time to complete   Feeding Skilled Clinical Factors: Patient requires assistance to cut meat. Poor initiation, requiring therapist to hand utensil to patient. She then utilizes appropriately with increased time.     UE Bathing: Minimal assistance;Verbal cueing  UE Bathing Skilled Clinical Factors: Patient tolerates sitting EOB for serial ADL engagement with overall CGA for balance. She requires physical assist and cognitive cues (task initiation, sequencing, problem-solving, attention) to facilitate task completion.    Pain Rating:  Tolerates session well.     Activity Tolerance:   Fair  and requires rest breaks  Please refer to the flowsheet for vital signs taken during this treatment.    After treatment:   Patient left in no apparent distress sitting up in chair, Call bell within reach, and Bed/ chair alarm activated    COMMUNICATION/EDUCATION:   The patient's plan of care was discussed with: physical therapist and registered nurse    Patient Education  Education Given To: Patient  Education Provided: Role of Therapy;Plan of Care  Education Method: Verbal  Barriers to Learning: Cognition  Education Outcome:  Verbalized understanding    Thank you for this referral.  Isabella Stalling, OT  Minutes: 23

## 2021-11-24 NOTE — Progress Notes (Signed)
Physician Progress Note      PATIENT:               Ariel Day, Ariel Day  CSN #:                  [id number]  DOB:                       02/05/1946  ADMIT DATE:       11/20/2021 11:52 AM  [place] DATE:  RESPONDING  PROVIDER #:        Toy Baker MD          QUERY TEXT:    Dear attending,    Noted documentation of Acute Kidney Injury per nephrology.  The patient does   not meet KDIGO per below reference for AKI.  Did note that on 10/12/21-Cr 0.77.    In order to support the diagnosis of AKI, please include additional clinical   indicators in your documentation.  Or please document if the diagnosis of AKI   has been ruled out after further study.    The medical record reflects the following:  Risk Factors: no hx. CKD  Clinical Indicators: 10/12/21-Cr 0.77, 8/27-Cr 1.31, 8/28-Cr 1.13, 8/29-Cr 1.10  8/30-per nephrology- AKI: Serum Cr 1.'3mg'$ /dl->> improved to 1.'1mg'$ /dl and   holding. Suspected pre-renal state.    Treatment: Nephrology consult, monitor labwork, imaging    Defined by Kidney Disease Improving Global Outcomes (KDIGO) clinical practice   guideline for acute kidney injury:  -Increase in SCr by greater than or equal to 0.3 mg/dl within 48 hours; or  -Increase or decrease in SCr to greater than or equal to 1.5 times baseline,   which is known or presumed to have occurred within the prior 7 days; or  -Urine volume < 0.16m/kg/h for 6 hours      Thank you,    Amy Roesner RN, BSN, CRCR, CCDS  Clinical Documentation Improvement  [phone] via [employer]  Options provided:  -- Acute kidney injury evidenced by, Please document evidence as well as a   numerical baseline creatinine, if known.  -- Acute kidney injury ruled out after study  -- Other - I will add my own diagnosis  -- Disagree - Not applicable / Not valid  -- Disagree - Clinically unable to determine / Unknown  -- Refer to Clinical Documentation Reviewer    PROVIDER RESPONSE TEXT:    This patient has an acute kidney injury as evidenced by elevated    creatinine/uptrending creatinine    Query created by: ROrvan Falconer Amy on 11/24/2021 10:01 AM      Electronically signed by:  MToy BakerMD 11/24/2021 10:29 AM

## 2021-11-24 NOTE — Progress Notes (Signed)
[place] [facility]  [address], [address] [place] [address]  W: [phone]  F: [phone]    Reason for Evaluation:   Ariel Day is a 76 y.o. 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      Hematology/Oncology Treatment History:   Follows at [facility] with Dr. Stephens November  ER+/PR+ lobular breast cancer diagnosed 12/20/2015 after presenting with multifocal bone mets  Current treatment: oral capecitabine 500 mg BID one week on, one week off.  Has been on hold since early August.      History of Present Illness:   Ariel Day is a pleasant 76 y.o. female who presents today for evaluation of metastatic breast cancer.      Patient admitted with complaints of generalized weakness, decreased PO intake, and confusion. Vitals on admission were stable.  Labs notable for WBC 20.3, left-shifted, Na 131, K 6.2, Cr 1.31, calcium 13.5, AST 112, alk phos 299, albumin 2.6.  CXR showed pulmonary edema with bilateral pleural effusions as well as lytic lesions in scapulae. CT head negative for acute abnormality.  UA with small blood and 1+ bacteria. She was treated with Lasix and empiric cefepime and admitted to medicine.    CT A/P noted large filling defect extending from L femoral vein to inferior portion of IVC, concerning for DVT; multiple hepatic mets; diffuse skeletal mets; and small volume ascites.  Doppler US with acute occlusive thrombus in L external iliac, common femoral, saphenofemoral junction, profunda femoral, and femoral. Subacute occlusive thrombus in the popliteal and posterior tibial veins. Limited visualization of the peroneal veins but show color filling; cannot exclude non-occlusive thrombus. No DVT in RLE.    At my evaluation, patient remains confused.  She is able to  answer some questions but not fully provide history.  Her husband states that they called their oncologist on Friday about the confusion and dehydration, and he advised trying Pedialyte/hydration, and if persisted to present to ER.  She has been off her capecitabine for ~2 weeks.    Interval History:  Patient sitting up in chair.  More alert but remains confused.  Calcium normalized.  Seen by palliative care today.    Husband at bedside.     Social History:  Lives in a motor home.  Building a home in [place] was obtained and pertinent findings reviewed above. Past medical history, social history, family history, medications, and allergies are located in the electronic medical record.    Physical Exam:   BP 126/83   Pulse (!) 109   Temp 97.3 F (36.3 C) (Oral)   Resp 18   Ht 5' 4.96" (1.65 m)   Wt 138 lb 14.2 oz (63 kg)   SpO2 97%   BMI 23.14 kg/m   ECOG PS: 3  General: somnolent   Respiratory: normal respiratory effort,  CV: tachycardic, no peripheral edema  Ext: warm, well perfused, calf asymmetry (L>R)  GI: soft, nontender, nondistended, no masses  Skin: no rashes, no ecchymoses, no petechiae    Results:     Lab Results   Component Value Date/Time    WBC 19.7 11/24/2021 05:30 AM    HGB 11.3 11/24/2021 05:30 AM    HCT 35.7 11/24/2021 05:30 AM    PLT 166 11/24/2021 05:30  AM    MCV 102.6 11/24/2021 05:30 AM     Lab Results   Component Value Date/Time    NA 133 11/24/2021 05:30 AM    K 3.3 11/24/2021 05:30 AM    CL 97 11/24/2021 05:30 AM    CO2 30 11/24/2021 05:30 AM    BUN 40 11/24/2021 05:30 AM     Lab Results   Component Value Date/Time    ALT 52 11/24/2021 05:30 AM    GLOB 3.7 11/24/2021 05:30 AM     Records from Epic reviewed and summarized above.  Test results above have been reviewed.    Imaging:     CT Result (most recent):  CT ABDOMEN PELVIS W IV CONTRAST 11/20/2021    Narrative  EXAM: CT ABDOMEN PELVIS W IV CONTRAST    INDICATION: abd pain w/ AMS and N/V    COMPARISON:  10/07/2021    CONTRAST: 100 mL of Isovue-370.    ORAL CONTRAST: None    TECHNIQUE:  Following the intravenous administration of intravenous contrast, axial images  were obtained through the abdomen and pelvis. Coronal and sagittal  reconstructions were generated. CT dose reduction was achieved through use of a  standardized protocol tailored for this examination and automatic exposure  control for dose modulation.    FINDINGS:  There are moderate-sized bilateral pleural effusions with adjacent compressive  atelectasis.    A 2.4 cm cyst is noted in the right hepatic lobe.  There are multiple masses in  the liver are again visualized compatible with diffuse metastatic disease.  One  of the largest is located in the posterior right hepatic lobe measures up to 2.5  cm (series 3 image number 46).   The spleen enhances appropriately.   The  pancreas enhances homogeneously. No discrete masses.  The gallbladder is  surgically absent.  No significant biliary dilatation.    The adrenal glands appear normal.    The kidneys enhance symmetrically. No hydronephrosis.    Visualized portions of the bladder are grossly unremarkable.  Pelvic structures  are not well visualized because of metallic streak artifact from bilateral hip  arthritis hardware.    The uterus is present.    There is a small volume of ascites in the abdomen.  There is no free air.  There  are no abscesses.    No significant lymphadenopathy.    No evidence of intestinal obstruction.  The appendix appears normal.    Aorta is normal in caliber.  There is apparent extensive filling defect from the left femoral vein to the  very inferior aspect of the inferior vena cava compatible with large DVT.    Diffuse skeletal metastatic disease is again noted.    Impression  1.  Large filling defect extending from the left femoral vein to the inferior  portion of the inferior vena cava concerning for deep venous thrombosis.    2.  Multiple hepatic masses are again noted  compatible with hepatic metastatic  disease.    3.  Diffuse skeletal metastases are again visualized.    4.  Small volume of ascites.      The findings were conveyed to [facility]  via perfectserve at 11/20/2021 5:54 PM  by Denna Haggard.  [place]      Imaging personally reviewed by me    Assessment:   1) Metastatic lobular breast cancer (ER+/PR+)  Follows with Dr. Stephens November at [facility].  Sites of disease include diffuse bone mets, liver mets, and  malignant pleural effusion. Currently on oral capecitabine 500 mg BID one week on, one week off.  It appears this was started in 2021 per his notes.  This has been on hold since early-mid August.    GOC ongoing.  Greatly appreciate Palliative Care expertise. Patient's daughter and husband share that patient appears to have had a rather dramatic clinical decline over short period of time.  Family understand that prognosis is guarded, especially in setting of hypercalcemia of malignancy, which is often a marker of advanced disease.  Her acute conditions are improving (AKI resolving and hypercalcemia nearly normalized).   Patient's daughter plans to reach out to Dr. Elinor Parkinson to update him on current clinical status.  Would defer final treatment decisions to Dr. Elinor Parkinson, but patient is not currently a candidate for cancer-directed therapy.  We also discussed that pending clinical course, it may be reasonable to consider therapeutic thoracentesis given malignant pleural effusion.   - Hold capecitabine    #) Hypercalcemia of malignancy, improving:  Presented with calcium 12.3, corrected 13.5  - Status post calcitonin 4 units/kg x 4 doses and Zometa x 1 dose.  Calcium improving, now down to corrected 10.7.  - Nephrology following    #) Metabolic encephalopathy: due to hypercalcemia and UTI    #) Extensive acute left proximal and distal vein DVT:  Incidentally noted on CT A/P with filling defect from L femoral vein extending to IVC.  Follow up Doppler with extensive DVT  throughout L external ilac to femoral vein and subacute occlusive thrombus in popliteal and posterior tibial veins.  Risk factors: a) metastatic breast cancer  - Continue Eliquis with load; recommend lifelong AC    #) Acute on chronic heart failure: Lasix per medicine    #) Acute cystitis: antibiotics per medicine    #) Orthostatic hypotension: management per medicine    #) Adrenal insufficiency: on chronic prednisone 10 mg daily    #) Staph in 1 of 2 blood cultures: likely contaminant    Oncology is available as needed. Thank you for involving me in the care of this patient.    Signed By: Kristopher Oppenheim, MD

## 2021-11-24 NOTE — Progress Notes (Signed)
Palliative Medicine   [place] [phone] (COPE)        Bonna Gains [phone] (COPE)      Palliative Medicine SW met with the patient at bedside along with Dr. Creig Hines and her husband [name]- patient is more awake than we have seen in previous encounters, but still limited. [name] shares that the patient is still not eating very much, she is drinking some ensure.     SW inquired if family has been able to speak with Dr. Elinor Parkinson- Mr. Ergle is not sure, deferring to [facility], he does share we can give her a call.     SW left voicemail for Colletta Regan to provide support and see if family was able to speak with her Oncologist.     Following along with you.     Thank you for including Palliative Medicine in the care of  [place], [place]

## 2021-11-25 ENCOUNTER — Inpatient Hospital Stay: Admit: 2021-11-25 | Payer: MEDICARE | Primary: Family Medicine

## 2021-11-25 LAB — CBC
Hematocrit: 35.5 % (ref 35.0–47.0)
Hemoglobin: 11.1 g/dL — ABNORMAL LOW (ref 11.5–16.0)
MCH: 31.9 PG (ref 26.0–34.0)
MCHC: 31.3 g/dL (ref 30.0–36.5)
MCV: 102 FL — ABNORMAL HIGH (ref 80.0–99.0)
MPV: 11.6 FL (ref 8.9–12.9)
Nucleated RBCs: 0 PER 100 WBC
Platelets: 176 10*3/uL (ref 150–400)
RBC: 3.48 M/uL — ABNORMAL LOW (ref 3.80–5.20)
RDW: 17.6 % — ABNORMAL HIGH (ref 11.5–14.5)
WBC: 16.9 10*3/uL — ABNORMAL HIGH (ref 3.6–11.0)
nRBC: 0 10*3/uL (ref 0.00–0.01)

## 2021-11-25 LAB — COMPREHENSIVE METABOLIC PANEL
ALT: 52 U/L (ref 12–78)
AST: 55 U/L — ABNORMAL HIGH (ref 15–37)
Albumin/Globulin Ratio: 0.6 — ABNORMAL LOW (ref 1.1–2.2)
Albumin: 2.3 g/dL — ABNORMAL LOW (ref 3.5–5.0)
Alk Phosphatase: 285 U/L — ABNORMAL HIGH (ref 45–117)
Anion Gap: 5 mmol/L (ref 5–15)
BUN: 40 MG/DL — ABNORMAL HIGH (ref 6–20)
Bun/Cre Ratio: 34 — ABNORMAL HIGH (ref 12–20)
CO2: 31 mmol/L (ref 21–32)
Calcium: 9 MG/DL (ref 8.5–10.1)
Chloride: 94 mmol/L — ABNORMAL LOW (ref 97–108)
Creatinine: 1.17 MG/DL — ABNORMAL HIGH (ref 0.55–1.02)
Est, Glom Filt Rate: 49 mL/min/{1.73_m2} — ABNORMAL LOW (ref >60)
Globulin: 3.6 g/dL (ref 2.0–4.0)
Glucose: 85 mg/dL (ref 65–100)
Potassium: 3.5 mmol/L (ref 3.5–5.1)
Sodium: 130 mmol/L — ABNORMAL LOW (ref 136–145)
Total Bilirubin: 0.7 MG/DL (ref 0.2–1.0)
Total Protein: 5.9 g/dL — ABNORMAL LOW (ref 6.4–8.2)

## 2021-11-25 LAB — EXTRA TUBES HOLD

## 2021-11-25 LAB — MAGNESIUM: Magnesium: 1.7 mg/dL (ref 1.6–2.4)

## 2021-11-25 LAB — CULTURE, BLOOD 2: Culture: NO GROWTH

## 2021-11-25 LAB — PHOSPHORUS: Phosphorus: 1.3 MG/DL — ABNORMAL LOW (ref 2.6–4.7)

## 2021-11-25 MED ORDER — SODIUM CHLORIDE 0.9 % IV SOLN
0.9 % | Freq: Once | INTRAVENOUS | Status: AC
Start: 2021-11-25 — End: 2021-11-25
  Administered 2021-11-25: 15:00:00 20 mmol via INTRAVENOUS

## 2021-11-25 MED ORDER — MIRTAZAPINE 15 MG PO TABS
15 MG | Freq: Every evening | ORAL | Status: AC
Start: 2021-11-25 — End: 2021-12-05
  Administered 2021-11-26 – 2021-12-05 (×7): 7.5 mg via ORAL

## 2021-11-25 MED ORDER — PROCHLORPERAZINE 25 MG RE SUPP
25 MG | Freq: Two times a day (BID) | RECTAL | Status: AC | PRN
Start: 2021-11-25 — End: 2021-12-05

## 2021-11-25 MED FILL — PROCHLORPERAZINE MALEATE 10 MG PO TABS: 10 MG | ORAL | Qty: 1

## 2021-11-25 MED FILL — PREDNISONE 10 MG PO TABS: 10 MG | ORAL | Qty: 1

## 2021-11-25 MED FILL — ELIQUIS 5 MG PO TABS: 5 MG | ORAL | Qty: 2

## 2021-11-25 MED FILL — METOCLOPRAMIDE HCL 5 MG/ML IJ SOLN: 5 MG/ML | INTRAMUSCULAR | Qty: 2

## 2021-11-25 MED FILL — MIDODRINE HCL 5 MG PO TABS: 5 MG | ORAL | Qty: 1

## 2021-11-25 MED FILL — PROCHLORPERAZINE 25 MG RE SUPP: 25 MG | RECTAL | Qty: 1

## 2021-11-25 MED FILL — PANTOPRAZOLE SODIUM 40 MG IV SOLR: 40 MG | INTRAVENOUS | Qty: 40

## 2021-11-25 MED FILL — POTASSIUM PHOSPHATES 45 MMOLE/15ML IV SOLN: 45 MMOLE/15ML | INTRAVENOUS | Qty: 6.67

## 2021-11-25 NOTE — Progress Notes (Signed)
Name: Ariel Day   MRN: [id number]  DOB: 06-30-1945    Nephrology Progress Note    Assessment:     AKI: Serum Cr 1.'3mg'$ /dl->> improved to 1.'1mg'$ /dl and holding. Suspected pre-renal state.      Hypercalcemia: Peak corrected Ca 13.4. Improving with Calcitonin/Zometa (8/28). Corrected Ca today down to 10.3. Etiology likely 2 to metastatic Breast CA.     Combined Systolic/Diastolic CHF: +pulm edema/b/l pleural effusions on presentation     Metastatic Breast CA     Staph Bacteremia-> contaminant    Hypokalemia/Hypophosphatemia    Dysphagia    Plan/Recommendations:  IV Kphos 21mol x1 dose again  Ct to encourage intake as tolerated  Trend Calcium level  Overall prognosis guarded/poor-palliative care consulting. Now DNR  AM labs    Subjective:  Husband at bedside. Patient reports nausea is slightly better.     ROS:   No CP. No SOB.    Exam:  BP 103/69   Pulse (!) 135   Temp 97.7 F (36.5 C) (Oral)   Resp 20   Ht 1.65 m (5' 4.96")   Wt 66.3 kg (146 lb 3.2 oz)   SpO2 96%   BMI 24.36 kg/m     Gen: NAD/Elderly/Frail  HEENT: AT/NC  Lungs/Chest wall: Clear. Decreased BS b/l bases. No respiratory distress  Cardiovascular: Regular rate, normal rhythm.   Abdomen/GU: Soft, NT,  BS+  Ext: No peripheral edema      Current Facility-Administered Medications   Medication Dose Route Frequency Provider Last Rate Last Admin    potassium phosphate 20 mmol in sodium chloride 0.9 % 250 mL IVPB  20 mmol IntraVENous Once Cobi Aldape PSherrye Payor MD 62.5 mL/hr at 11/25/21 1107 20 mmol at 11/25/21 1107    mirtazapine (REMERON) tablet 7.5 mg  7.5 mg Oral Nightly CJyl Heinz MD        prochlorperazine (COMPAZINE) suppository 25 mg  25 mg Rectal Q12H PRN CJyl Heinz MD        prochlorperazine (COMPAZINE) tablet 5 mg  5 mg Oral Q6H PRN DYoulanda RoysMilligram, PA-C   5 mg at 11/25/21 0548    apixaban (ELIQUIS)  tablet 10 mg  10 mg Oral BID YTania Ade MD   10 mg at 11/25/21 02585   Followed by    [Derrill MemoON 11/30/2021] apixaban (ELIQUIS) tablet 5 mg  5 mg Oral BID YTania Ade MD        metoclopramide (REGLAN) injection 5 mg  5 mg IntraVENous TID YTania Ade MD   5 mg at 11/25/21 0829    pantoprazole (PROTONIX) 40 mg in sodium chloride (PF) 0.9 % 10 mL injection  40 mg IntraVENous Q24H Yi Sun, MD   40 mg at 11/25/21 1224    midodrine (PROAMATINE) tablet 2.5 mg  2.5 mg Oral BID Bleck B Ngwafang, MD   2.5 mg at 11/25/21 0828    sodium chloride flush 0.9 % injection 5-40 mL  5-40 mL IntraVENous 2 times per day Bleck B Ngwafang, MD   10 mL at 11/25/21 0829    sodium chloride flush 0.9 % injection 5-40 mL  5-40 mL IntraVENous PRN Bleck B Ngwafang, MD        0.9 % sodium chloride infusion   IntraVENous PRN Bleck B Ngwafang, MD        ondansetron (ZOFRAN-ODT) disintegrating tablet 4 mg  4 mg Oral Q8H PRN Bleck B Ngwafang, MD        Or    ondansetron (ZOFRAN)  injection 4 mg  4 mg IntraVENous Q6H PRN Bleck B Ngwafang, MD   4 mg at 11/24/21 1611    polyethylene glycol (GLYCOLAX) packet 17 g  17 g Oral Daily PRN Bleck B Ngwafang, MD        acetaminophen (TYLENOL) tablet 650 mg  650 mg Oral Q6H PRN Bleck B Ngwafang, MD        Or    acetaminophen (TYLENOL) suppository 650 mg  650 mg Rectal Q6H PRN Bleck B Ngwafang, MD        predniSONE (DELTASONE) tablet 10 mg  10 mg Oral Daily Bleck B Ngwafang, MD   10 mg at 11/25/21 0829        Labs/Data:    Lab Results   Component Value Date    WBC 16.9 (H) 11/25/2021    HGB 11.1 (L) 11/25/2021    HCT 35.5 11/25/2021    MCV 102.0 (H) 11/25/2021    PLT 176 11/25/2021       Lab Results   Component Value Date/Time    NA 130 11/25/2021 05:58 AM    K 3.5 11/25/2021 05:58 AM    CL 94 11/25/2021 05:58 AM    CO2 31 11/25/2021 05:58 AM    BUN 40 11/25/2021 05:58 AM    CREATININE 1.17 11/25/2021 05:58 AM    GLUCOSE 85 11/25/2021 05:58 AM    CALCIUM 9.0 11/25/2021 05:58 AM    LABGLOM 49 11/25/2021 05:58 AM        Wt Readings  from Last 3 Encounters:   11/25/21 66.3 kg (146 lb 3.2 oz)   10/08/21 64.9 kg (143 lb)       No intake or output data in the 24 hours ending 11/25/21 1351      Patient seen and examined. Chart reviewed. Labs, data and other pertinent notes reviewed in last 24 hrs.    PMH/SH/FH reviewed and unchanged compared to H&P    Discussed with patient/husband       Jerzey Komperda,MD  [facility] ([place])

## 2021-11-25 NOTE — Progress Notes (Cosign Needed)
[facility]                                                                                          Hospitalist Progress Note  Clearnce Hasten, APRN - NP  Answering service: [phone] OR [phone] from in house phone        Date of Service:  11/25/2021  NAME:  Ariel Day  DOB:  1945-09-18  MRN:  [id number]      Admission Summary:   Patient is a 76 year old female with a past medical history of stage IV breast cancer with metastatic disease to bones and liver, on oral chemotherapy, history of combined systolic and diastolic congestive heart failure, status post ICD, orthostatic hypotension, GERD, and cancer related chronic pain, who came to the emergency room due to generalized weakness, worsening confusion, feeling weak and tired, decreased p.o. intake and incoherent speech for the past 24 hours.  She was admitted for further work-up.    Interval history / Subjective:   Saw the patient on rounds. She began choking just prior to my exam while trying to drink some water.     Long discussion with patients daughter via telephone. Decision made to do a therapeutic thoracentesis with ultrasound showing large bilateral pleural effusions. She does not want a pleur X drain placed at this time.      Assessment & Plan:     Generalized weakness;  worsening of functional status;  Severe hypercalcemia: Improved  Stage IV breast cancer with metastatic disease to bones and liver;  - Oncology consulted;Currently on oral capecitabine 500 mg BID one week on, one week off.  This has been on hold since early-mid August Hold capecitabine while inpatient  - Patient has well-established follow-up with Dr. Jed Limerick;   - Palliative care following. Had a discussion today with husband and daughter stephanie. As of now their goals are to return the patient to a functional level that she can return home where they can further consider hospice care.  - She is now a DNR , palliative met with family this morning  and they are not yet ready for hospice      Nausea and vomiting  - poor oral intake for a while likely 2/2 metastatic disease  - CT Abd:no bowel obstrution, has liver mets   - PRN zofran, added compazine     Extensive acute left proximal and distal vein DVT:  - Confirmed on Doppler ultrasound  - S/P heparin gtt,   - Vascular saw the patient, no intervention at this time, continue [facility]  - Patient will need lifelong anticoagulation;   -Transitioned to po eliquis 8/30 AM. Continue loading dose of 10mg  BID x 2 weeks (EOT 9/5) then 5mg  BID indefinitely    Hypercalcemia of malignancy: improved  - Initial calcium 12.3, now 9.3  -nephro following  - S/p IV Zometa last night, S/p SQ Calcitonin (total of 4 doses)     Acute cystitis:  - Started on cefepime on admission;  - Follow urine culture results;  - Blood culture 8/27 1/2 CNS likely contamination   - Stopped Cefepime today (8/31)  received 5 days ABX     Bilateral pleural effusions:  - Malignant;   - Patient had a thoracentesis about 6 weeks ago;  - She is currently on room air and in no acute respiratory distress;   - [place] 8/29 noticed adequate pleural effusion to be drained if indicated: if decide to home with home hospice, may consider one time tap prior discharge if increasing tachypnea   - s/p right sided thoracentesis on 9/1 with ~ 800 cc drained off      Orthostatic hypotension  - Home BP medications held on admission;  - continue Midodrine      Hyponatremia:  - Likely hypovolemic, corrected overnight;     Dysphagia:   Per family  Having issue of solid food for a while per family   -supplement ensure     Code status: DNR  Prophylaxis: Eliquis   Care Plan discussed with: Patient, husband, daughter, RN  Anticipated Disposition: TBD           Review of Systems:   Pertinent items are noted in HPI.       Vital Signs:    Last 24hrs VS reviewed since prior progress note. Most recent are:  Vitals:    11/25/21 1948   BP:    Pulse: (!) 133   Resp:    Temp:    SpO2:        No  intake or output data in the 24 hours ending 11/25/21 2025       Physical Examination:     I had a face to face encounter with this patient and independently examined them on 11/25/2021 as outlined below:          Constitutional:  No acute distress, ill appearing, lethargic   ENT:  Oral mucosa moist, oropharynx benign.    Resp:  CTA bilaterally. No wheezing/rhonchi/rales. No accessory muscle use.    CV:  Regular rhythm, normal rate, no murmurs, gallops, rubs    GI:  Soft, non distended, non tender. normoactive bowel sounds, no hepatosplenomegaly     Musculoskeletal:  LLE mildly larger than RLE, warm, 2+ pulses throughout    Neurologic:  Moves all extremities.  AAOx3, CN II-XII Grossly intact            Data Review:    Review and/or order of clinical lab test  Review and/or order of tests in the radiology section of CPT  Review and/or order of tests in the medicine section of CPT      Labs:     Recent Labs     11/24/21  0530 11/25/21  0558   WBC 19.7* 16.9*   HGB 11.3* 11.1*   HCT 35.7 35.5   PLT 166 176       Recent Labs     11/23/21  0518 11/24/21  0530 11/25/21  0558   NA 134* 133* 130*   K 3.7 3.3* 3.5   CL 95* 97 94*   CO2 32 30 31   BUN 35* 40* 40*   MG 2.1 1.8 1.7   PHOS 2.0* 1.3* 1.3*       Recent Labs     11/23/21  0518 11/24/21  0530 11/25/21  0558   ALT 56 52 52   GLOB 3.4 3.7 3.6       No results for input(s): INR, APTT in the last 72 hours.    Invalid input(s): PTP   No results for input(s): TIBC, FERR in the last  72 hours.    Invalid input(s): FE, PSAT   No results found for: FOL, RBCF   No results for input(s): PH, PCO2, PO2 in the last 72 hours.  No results for input(s): CPK in the last 72 hours.    Invalid input(s): CPKMB, CKNDX, TROIQ  No results found for: CHOL, CHOLX, CHLST, CHOLV, HDL, HDLC, LDL, LDLC, TGLX, TRIGL  No results found for: GLUCPOC  @LABUA @      Medications Reviewed:     Current Facility-Administered Medications   Medication Dose Route Frequency    mirtazapine (REMERON) tablet 7.5 mg   7.5 mg Oral Nightly    prochlorperazine (COMPAZINE) suppository 25 mg  25 mg Rectal Q12H PRN    prochlorperazine (COMPAZINE) tablet 5 mg  5 mg Oral Q6H PRN    apixaban (ELIQUIS) tablet 10 mg  10 mg Oral BID    Followed by    Melene Muller ON 11/30/2021] apixaban (ELIQUIS) tablet 5 mg  5 mg Oral BID    metoclopramide (REGLAN) injection 5 mg  5 mg IntraVENous TID    pantoprazole (PROTONIX) 40 mg in sodium chloride (PF) 0.9 % 10 mL injection  40 mg IntraVENous Q24H    midodrine (PROAMATINE) tablet 2.5 mg  2.5 mg Oral BID    sodium chloride flush 0.9 % injection 5-40 mL  5-40 mL IntraVENous 2 times per day    sodium chloride flush 0.9 % injection 5-40 mL  5-40 mL IntraVENous PRN    0.9 % sodium chloride infusion   IntraVENous PRN    ondansetron (ZOFRAN-ODT) disintegrating tablet 4 mg  4 mg Oral Q8H PRN    Or    ondansetron (ZOFRAN) injection 4 mg  4 mg IntraVENous Q6H PRN    polyethylene glycol (GLYCOLAX) packet 17 g  17 g Oral Daily PRN    acetaminophen (TYLENOL) tablet 650 mg  650 mg Oral Q6H PRN    Or    acetaminophen (TYLENOL) suppository 650 mg  650 mg Rectal Q6H PRN    predniSONE (DELTASONE) tablet 10 mg  10 mg Oral Daily     ______________________________________________________________________  EXPECTED LENGTH OF STAY: 5  ACTUAL LENGTH OF STAY:          5                 Clearnce Hasten, APRN - NP

## 2021-11-25 NOTE — Progress Notes (Addendum)
Palliative Medicine Consult  [place]: [phone])    Patient Name: Ariel Day  Date of Birth: September 09, 1945    Date of Initial Consult: 11/21/2021  Date of Today's Visit: 11/25/2021  Reason for Consult: care decisions  Requesting Provider: Dr. Dairl Ponder      SUMMARY:   Lidia Clavijo is a 76 y.o. with a past history of metastatic breast cancer (dx 2017 w bone mets), long hx of treatments followed by Dr. Stephens November at [facility] (current tx capecitabine), hx liver/ bone/ malignant pleural effusion [facility] 10/07/2021 temporary pleural drain), Hx chronic diastolic CHF s/p ICD 0354 EF 10% in past, improved to 65% in 2022 , adrenal insufficiency, orthostatic hypotension , who was admitted on 11/20/2021 from home with a diagnosis of weakness, fatigue, no longer able to walk, AMS.     Current medical issues leading to Palliative Medicine involvement include: patient being treated for hypercalcemia of malignancy (corrected calcium 13.5 on admission)  UTI and also acute LLE DVT extending into Inferior Vena Cava  CT Abd: 11/20/2021   1.  Large filling defect extending from the left femoral vein to the inferior  portion of the inferior vena cava concerning for deep venous thrombosis.  2.  Multiple hepatic masses are again noted compatible with hepatic metastatic  disease   3.  Diffuse skeletal metastases are again visualized  4.  Small volume of ascites.    ECHO 8/27 : EF 65-70%    Social: married to spouse Gabbriella Presswood for over 89 years.  They have 3 adult children (Dtr, [place], local, dtr [place], in [place] (family practice MD) and son, [name] in [place])  6 grandchildren in [place].   Recent move from [facility] to [place] to live near dtr. Currently in mobile home while inlaw suite under construction (ready this week they hope)  Patient and spouse have traveled extensively in the last 15 years, going to all 69 states in motor home.  They have spent many winters in [place], .  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  Spouse notes that patient has had ongoing functional decline in past 6 months, needing more assistance with ADL care       PALLIATIVE DIAGNOSES:   Metabolic encephalopathy  Hypercalcemia of malignancy, breast cancer  Acute cystitis  LLE DVT extending into IVC  Pleural effusions, bilateral R>L  Diffuse skeletal metastatic disease  Ascites  Functional decline, weakness, cancer related fatigue  Poor appetite, weight loss   Palliative medicine encounter       PLAN:   Along w/ Theodosia Quay LCSW meet w/ pt and husband [name]. Pt alert/can answer questions but remains w/ confusion.   Local dtr Colletta [place] helps navigate health system and [name] relies on her to help make medical decisions for pt. [name] says that Dr Elinor Parkinson talked about another chemotherapy agent that pt may be able to tolerate. Encourage Dr Elinor Parkinson and Dr Glori Bickers to discuss this, but at the same time he also realizes that she may not tolerate any further tx and may need Hospice care.   Family is not ready for Hospice at this time.   DDNR form given to family to complete leaves the hospital   Sleep:   Start Remeron 7.'5mg'$  bedtime.   Nausea: Needs regimen that she can tolerate at home.   Cont Zofran ODT.  Trial Compazine suppository prn    Following w/ you- please contact on - call if needed over holiday weekend.  ADVANCE CARE PLANNING / TREATMENT PREFERENCES:     GOALS OF CARE:  '[]'$ -Comfort   '[]'$ -Cure   '[]'$ -Prolong life   '[x]'$ -Recovery from acute illness   '[]'$ -Rehabilitation  '[x]'$ -Other:         TREATMENT PREFERENCES:     Patient and family's personal goals include: pt unable to state    Important upcoming milestones or family events: they have been adding to dtr's house, hoping to move in this week (currently in their mobile home in the driveway)     The patient identifies the following as important for living well: pt enjoys traveling, being on the go all the time, spending time with family    Advance Care Planning:  '[x]'$  The [place] Team  has updated the ACP Navigator with [place] and Patient [place]     The patient has appointed the following active healthcare agents:      The Patient has the following current code status:    Code Status: DNR         Other:    As far as possible, the palliative care team has discussed with patient / health care proxy about goals of care / treatment preferences for patient.     HISTORY:     History obtained from: chart, spouse    CHIEF COMPLAINT: weakness, confusion    HPI/SUBJECTIVE:    The patient is:   '[x]'$  Verbal and participatory  '[]'$  Non-participatory due to: delirium    More alert, drank Ensure. States she is not hungry.        ROS / FUNCTIONAL ASSESSMENT:     Palliative Performance Scale (PPS)  PPS: 30         Modified-[place] Symptom Assessment Scale (ESAS)  Pain Score: No pain  Dyspnea Score: No shortness of breath    Clinical Pain Assessment  Severity: 0               PSYCHOSOCIAL/SPIRITUAL SCREENING:     Palliative IDT has assessed this patient for cultural preferences / practices and a referral made as appropriate to needs Orthoptist, Patient Advocacy, Ethics, etc.)    Spiritual affiliation was reviewed as documented by palliative care chaplain.      Any spiritual / religious / cultural beliefs and practices that will affect your patient's care?:  '[]'$  Yes /  '[x]'$  No   If "Yes" to discuss with pastoral care during IDT     Does caregiver feel burdened by caring for their loved one:   '[]'$  Yes /  '[x]'$  No /  '[]'$  No Caregiver Present/Available '[]'$  No Caregiver '[]'$  Pt Lives at Facility  If "Yes" to discuss with social work during IDT    Anticipatory grief assessment:   '[x]'$  Normal  / '[]'$  Maladaptive     If "Maladaptive" to discuss with social work during [place]:     From Therapist, sports flowsheet:  Wt Readings from Last 3 Encounters:   11/25/21 146 lb 3.2 oz (66.3 kg)   10/08/21 143 lb (64.9 kg)     Blood pressure 123/78, pulse (!) 116, temperature 97.5 F (36.4 C),  temperature source Oral, resp. rate 26, height 5' 4.96" (1.65 m), weight 146 lb 3.2 oz (66.3 kg), SpO2 95 %.    Last bowel movement, if known:     Constitutional: pale, chronically ill appearing, alert to person and family   GI: Soft, +BS, NTTP  HISTORY:     Principal Problem:    AMS (altered mental status)  Active Problems:    Acute encephalopathy    Carcinoma of breast metastatic to bone (HCC)    Complicated UTI (urinary tract infection)    Hypercalcemia    Acute deep vein thrombosis (DVT) of femoral vein of left lower extremity ([place])    Palliative care by specialist    Debility    Neoplastic malignant related fatigue    Confusion    Nausea  Resolved Problems:    * No resolved hospital problems. *    Past Medical History:   Diagnosis Date    Breast cancer ([place])     Cardiomyopathy ([place])     EF10%; 65% 2022    Malignant pleural effusion     Orthostatic hypotension       Past Surgical History:   Procedure Laterality Date    CARDIAC DEFIBRILLATOR PLACEMENT Left 2012    CHOLECYSTECTOMY      JOINT REPLACEMENT Bilateral     MASTECTOMY, BILATERAL        Family History   Problem Relation Age of Onset    Heart Disease Mother     Breast Cancer Mother     Heart Disease Father       History reviewed, no pertinent family history.  Social History     Tobacco Use    Smoking status: Never    Smokeless tobacco: Not on file   Substance Use Topics    Alcohol use: Not Currently     No Known Allergies   Current Facility-Administered Medications   Medication Dose Route Frequency    potassium phosphate 20 mmol in sodium chloride 0.9 % 250 mL IVPB  20 mmol IntraVENous Once    mirtazapine (REMERON) tablet 7.5 mg  7.5 mg Oral Nightly    prochlorperazine (COMPAZINE) suppository 25 mg  25 mg Rectal Q12H PRN    prochlorperazine (COMPAZINE) tablet 5 mg  5 mg Oral Q6H PRN    apixaban (ELIQUIS) tablet 10 mg  10 mg Oral BID    Followed by    Derrill Memo ON 11/30/2021] apixaban (ELIQUIS) tablet 5 mg  5 mg Oral BID    metoclopramide (REGLAN) injection 5  mg  5 mg IntraVENous TID    pantoprazole (PROTONIX) 40 mg in sodium chloride (PF) 0.9 % 10 mL injection  40 mg IntraVENous Q24H    midodrine (PROAMATINE) tablet 2.5 mg  2.5 mg Oral BID    sodium chloride flush 0.9 % injection 5-40 mL  5-40 mL IntraVENous 2 times per day    sodium chloride flush 0.9 % injection 5-40 mL  5-40 mL IntraVENous PRN    0.9 % sodium chloride infusion   IntraVENous PRN    ondansetron (ZOFRAN-ODT) disintegrating tablet 4 mg  4 mg Oral Q8H PRN    Or    ondansetron (ZOFRAN) injection 4 mg  4 mg IntraVENous Q6H PRN    polyethylene glycol (GLYCOLAX) packet 17 g  17 g Oral Daily PRN    acetaminophen (TYLENOL) tablet 650 mg  650 mg Oral Q6H PRN    Or    acetaminophen (TYLENOL) suppository 650 mg  650 mg Rectal Q6H PRN    predniSONE (DELTASONE) tablet 10 mg  10 mg Oral Daily          LAB AND IMAGING FINDINGS:     Lab Results   Component Value Date/Time    WBC 16.9 11/25/2021 05:58 AM    HGB 11.1  11/25/2021 05:58 AM    PLT 176 11/25/2021 05:58 AM     Lab Results   Component Value Date/Time    NA 130 11/25/2021 05:58 AM    K 3.5 11/25/2021 05:58 AM    CL 94 11/25/2021 05:58 AM    CO2 31 11/25/2021 05:58 AM    BUN 40 11/25/2021 05:58 AM    MG 1.7 11/25/2021 05:58 AM    PHOS 1.3 11/25/2021 05:58 AM      Lab Results   Component Value Date/Time    GLOB 3.6 11/25/2021 05:58 AM     Lab Results   Component Value Date/Time    INR 1.2 11/20/2021 07:18 PM    APTT 25.1 11/20/2021 07:18 PM      No results found for: IRON, TIBC, IBCT, FERR   No results found for: PH, PCO2, PO2  No components found for: GLPOC   No results found for: CPK, CKMB, TROPONINI           Only check if applicable and billing time based rather than MDM    '[x]'$    The total encounter time on this service date was 35 minutes which was spent performing a face-to-face encounter and personally completing the provider-level activities documented in the note.  This includes time spent prior to the visit and after the visit in direct care of the  patient.  This time does not include time spent in any separately reportable services.

## 2021-11-25 NOTE — Progress Notes (Addendum)
Palliative Medicine    [phone] (COPE)        Bonna Gains [phone] (COPE)    The Palliative Medicine SW and Dr. Creig Hines met with the patient's husband [name] and patient at bedside, patient awake but still mildly confused- she shares that it was "hard to open" and that is why she is not eating (husband whispers to SW that he tried to assist her in feeding and she would not eat). She has also been complaining of nausea per chart review- See Dr. Creig Hines' notes regarding medication recommendations, also prescribing a sleep aide given patient's complaint about difficulty sleeping.     We met with Mr. Schriever outside of the patient's room (family prefers to talk outside of room regarding goals of care) and he is appropriately tearful, SW provided support- he shares that Colletta Car[place] talked to Dr. Elinor Parkinson, and Dr. Elinor Parkinson tells them the next step is more chemotherapy. Mr. Morren does acknowledge concerns about her tolerating chemotherapy- we highlight our concern as well, given that patient is not eating, and is a maximum assist- we ask if this would be in [place] or [place], and Mr. Zwack thinks that chemotherapy will have to be in [place] due to her weakness. We encourage family to ask Dr. Elinor Parkinson and Dr. Glori Bickers to discuss together if family is wanting her to be evaluated for treatment in [place].. we do verbalize our concern about her ability to tolerate and Mr. Weakland understands and acknowledges. He shares that he recognizes that they may be nearing hospice point, he reflects on time.. SW acknowledged and provided support, anticipatory grief- acknowledged all we could want is more time with our loved ones. 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 He reflects on the anticipatory grief of the family and not wanting to loose her. Support provided.     We also discussed Durable DNR- we explain patient has  DNR and this protection while she is here in the hospital (family agreed during our meeting), but we highly encourage DDNR for when she discharges the hospital- we explain that if family wants to pursue more treatment, the DDNR does not prevent that- additional education provided. Mr. Summerhill will review with their daughter Colletta [place] (Mr. Taitano looks to [place] w/ assistance in decision making).     Palliative Medicine will continue to follow along with you- SW left voicemail for [facility] yesterday, she has our contact if she would like to discuss with our team further.     Family is not ready for hospice care at this time, they are exploring additional chemotherapy per Dr. Elinor Parkinson recommendation (patient's Oncologist).    Thank you for including Palliative Medicine in the care of  [place], [place]

## 2021-11-25 NOTE — Progress Notes (Signed)
Physician Progress Note      PATIENT:               Ariel Day, Ariel Day  CSN #:                  [id number]  DOB:                       [birth year]  ADMIT DATE:       11/20/2021 11:52 AM  [place] DATE:  RESPONDING  PROVIDER #:        Toy Baker MD          QUERY TEXT:    Dear attending,    Pt admitted with altered mental status.  Pt noted to have elevated WBC,   elevated neutrophils and documentation of metabolic encephalopathy and acute   cystitis. The patient was on IV Cefepime x 5 days. Urine culture was negative.    If possible, please document in the progress notes and discharge summary if   you are evaluating and /or treating any of the following:    The medical record reflects the following:  Risk Factors: hx. mets cancer    Clinical Indicators: 8/27-WBC 20.3, neutrophils 84, lactic acid level 1.7, UA   + 1 bacteria, urine cx-no growth  Per H&P- AMS / Metabolic Encephalopathy  likely multifactorial in setting of hypercalcemia and uti  8/30-per PN- Acute cystitis:  Started on cefepime on admission;  Follow urine culture results;  Blood culture 8/27 1/2 CNS likely contamination  Will give 5 days of tx, dc abx on 8/31  8/31-per Oncology-Acute cystitis: antibiotics per medicine  Staph in 1 of 2 blood cultures: likely contaminant    Treatment: IV Cefepime,  Monitor vital signs, labwork, imaging      Thank you,    Amy Roesner RN, BSN, CRCR, CCDS  Clinical Documentation Improvement  [phone] or via Perfect Serve  Options provided:  -- Sepsis, present on admission due to cystitis  -- SIRS, please specify cause  -- SIRS of non-infectious origin, please specify cause  -- Other - I will add my own diagnosis  -- Disagree - Not applicable / Not valid  -- Disagree - Clinically unable to determine / Unknown  -- Refer to Clinical Documentation Reviewer    PROVIDER RESPONSE TEXT:    This patient has sepsis which was present on admission due to cystitis.    Query created by: Laurette Schimke on 11/25/2021 7:16 AM      Electronically  signed by:  Toy Baker MD 11/25/2021 10:53 AM

## 2021-11-25 NOTE — Care Coordination-Inpatient (Signed)
Transition of Care Plan:    RUR:  17% Moderate  Prior Level of Functioning: Required some assistance with ADLs  Disposition: TBD  If SNF or IPR: Date FOC offered: 11/25/21  Date FOC received: pending  Accepting facility:   Date authorization started with reference number:   Date authorization received and expires:   Follow up appointments: TBD  DME needed: TBD-pending final DC plans-Owns wheelchair and walker  Transportation at discharge: Likely BLS  IM/IMM Medicare/Tricare letter given: 11/23/21  Is patient a Veteran and connected with VA? NA   If yes, was [employer] transfer form completed and VA notified?   Caregiver Contact: Husband Ariel Day, [phone]  Discharge Caregiver contacted prior to discharge? NA  Care Conference needed? NA  Barriers to discharge: NA    CM met with patient's husband Ariel Day to discuss transitional care plan. Given patient's current functional status, current recommendations are for SNF-[facility] team in agreement. Ariel Day will discuss further with their daughter Ariel Day. Ariel Day reports that the patient was in a rehab facility in [place] but none locally. Currently, the patient and Ariel Day are living in a mobile home on their daughters property. Palliative care following. Should patient's family provide SNF choice, [name] will be needed; anticipate DC greater than 48hrs. CM to monitor.    [place], [place]

## 2021-11-25 NOTE — Plan of Care (Signed)
Problem: Discharge Planning  Goal: Discharge to home or other facility with appropriate resources  Outcome: Progressing     Problem: Safety - Adult  Goal: Free from fall injury  Outcome: Progressing  Flowsheets (Taken 11/25/2021 2322)  Free From Fall Injury: Instruct family/caregiver on patient safety     Problem: Pain  Goal: Verbalizes/displays adequate comfort level or baseline comfort level  Outcome: Progressing  Flowsheets (Taken 11/25/2021 2058 by Lolita Rieger, RN)  Verbalizes/displays adequate comfort level or baseline comfort level: Encourage patient to monitor pain and request assistance     Problem: Skin/Tissue Integrity  Goal: Absence of new skin breakdown  Description: 1.  Monitor for areas of redness and/or skin breakdown  2.  Assess vascular access sites hourly  3.  Every 4-6 hours minimum:  Change oxygen saturation probe site  4.  Every 4-6 hours:  If on nasal continuous positive airway pressure, respiratory therapy assess nares and determine need for appliance change or resting period.  Outcome: Progressing     Problem: ABCDS Injury Assessment  Goal: Absence of physical injury  Outcome: Progressing

## 2021-11-26 LAB — COMPREHENSIVE METABOLIC PANEL
ALT: 71 U/L (ref 12–78)
AST: 96 U/L — ABNORMAL HIGH (ref 15–37)
Albumin/Globulin Ratio: 0.7 — ABNORMAL LOW (ref 1.1–2.2)
Albumin: 2.6 g/dL — ABNORMAL LOW (ref 3.5–5.0)
Alk Phosphatase: 384 U/L — ABNORMAL HIGH (ref 45–117)
Anion Gap: 8 mmol/L (ref 5–15)
BUN: 40 MG/DL — ABNORMAL HIGH (ref 6–20)
Bun/Cre Ratio: 34 — ABNORMAL HIGH (ref 12–20)
CO2: 30 mmol/L (ref 21–32)
Calcium: 9.3 MG/DL (ref 8.5–10.1)
Chloride: 92 mmol/L — ABNORMAL LOW (ref 97–108)
Creatinine: 1.19 MG/DL — ABNORMAL HIGH (ref 0.55–1.02)
Est, Glom Filt Rate: 48 mL/min/{1.73_m2} — ABNORMAL LOW (ref >60)
Globulin: 3.8 g/dL (ref 2.0–4.0)
Glucose: 95 mg/dL (ref 65–100)
Potassium: 5.4 mmol/L — ABNORMAL HIGH (ref 3.5–5.1)
Sodium: 130 mmol/L — ABNORMAL LOW (ref 136–145)
Total Bilirubin: 0.8 MG/DL (ref 0.2–1.0)
Total Protein: 6.4 g/dL (ref 6.4–8.2)

## 2021-11-26 LAB — CBC
Hematocrit: 35.2 % (ref 35.0–47.0)
Hemoglobin: 11.3 g/dL — ABNORMAL LOW (ref 11.5–16.0)
MCH: 32.2 PG (ref 26.0–34.0)
MCHC: 32.1 g/dL (ref 30.0–36.5)
MCV: 100.3 FL — ABNORMAL HIGH (ref 80.0–99.0)
MPV: 10.9 FL (ref 8.9–12.9)
Nucleated RBCs: 0.1 PER 100 WBC — ABNORMAL HIGH
Platelets: 169 10*3/uL (ref 150–400)
RBC: 3.51 M/uL — ABNORMAL LOW (ref 3.80–5.20)
RDW: 17.3 % — ABNORMAL HIGH (ref 11.5–14.5)
WBC: 18.4 10*3/uL — ABNORMAL HIGH (ref 3.6–11.0)
nRBC: 0.02 10*3/uL — ABNORMAL HIGH (ref 0.00–0.01)

## 2021-11-26 LAB — PHOSPHORUS: Phosphorus: 2.1 MG/DL — ABNORMAL LOW (ref 2.6–4.7)

## 2021-11-26 LAB — MAGNESIUM: Magnesium: 2 mg/dL (ref 1.6–2.4)

## 2021-11-26 MED ORDER — SODIUM PHOSPHATE 3 MMOL/ML IV SOLN (MIXTURES ONLY)
3 mmol/mL | Freq: Once | INTRAVENOUS | Status: AC
Start: 2021-11-26 — End: 2021-11-26
  Administered 2021-11-26: 18:00:00 20 mmol via INTRAVENOUS

## 2021-11-26 MED FILL — METOCLOPRAMIDE HCL 5 MG/ML IJ SOLN: 5 MG/ML | INTRAMUSCULAR | Qty: 2

## 2021-11-26 MED FILL — MIDODRINE HCL 5 MG PO TABS: 5 MG | ORAL | Qty: 1

## 2021-11-26 MED FILL — ELIQUIS 5 MG PO TABS: 5 MG | ORAL | Qty: 2

## 2021-11-26 MED FILL — PREDNISONE 10 MG PO TABS: 10 MG | ORAL | Qty: 1

## 2021-11-26 MED FILL — SODIUM PHOSPHATES 45 MMOLE/15ML IV SOLN: 45 MMOLE/15ML | INTRAVENOUS | Qty: 6.67

## 2021-11-26 MED FILL — PANTOPRAZOLE SODIUM 40 MG IV SOLR: 40 MG | INTRAVENOUS | Qty: 40

## 2021-11-26 MED FILL — MIRTAZAPINE 15 MG PO TABS: 15 MG | ORAL | Qty: 1

## 2021-11-26 MED FILL — PROCHLORPERAZINE MALEATE 10 MG PO TABS: 10 MG | ORAL | Qty: 1

## 2021-11-26 NOTE — Plan of Care (Signed)
Problem: Safety - Adult  Goal: Free from fall injury  11/26/2021 1056 by Haydee Salter, RN  Outcome: Progressing  Flowsheets (Taken 11/26/2021 1053)  Free From Fall Injury: Instruct family/caregiver on patient safety  11/25/2021 2326 by Majel Homer, RN  Outcome: Progressing  Flowsheets (Taken 11/25/2021 2322)  Free From Fall Injury: Instruct family/caregiver on patient safety

## 2021-11-26 NOTE — Progress Notes (Signed)
Name: Ariel Day   MRN: [id number]  DOB: [birth year]    Nephrology Progress Note    Assessment:     AKI: Serum Cr 1.'3mg'$ /dl->> improved to 1.'1mg'$ /dl and holding. Suspected pre-renal state.      Hypercalcemia: Peak corrected Ca 13.4. Improving with Calcitonin/Zometa (8/28). Corrected Ca today down to 10.3. Etiology likely 2 to metastatic Breast CA.     Combined Systolic/Diastolic CHF: +pulm edema/b/l pleural effusions on presentation     Metastatic Breast CA     Staph Bacteremia-> contaminant    Hypokalemia/Hypophosphatemia    Hyponatremia: mild    Dysphagia    Serum K hemolyzed today.    Plan/Recommendations:  IV Naphos 80mol x1 dose again  Ct to encourage intake as tolerated  Trend Calcium level  Overall prognosis guarded/poor-palliative care consulting. Now DNR  AM labs    Subjective:  Patient denies any recent vomiting. Remains quite fatigued.    ROS:   No CP. No SOB.    Exam:  BP 122/83   Pulse (!) 128   Temp 97.5 F (36.4 C) (Oral)   Resp 18   Ht 1.65 m (5' 4.96")   Wt 66.3 kg (146 lb 3.2 oz)   SpO2 94%   BMI 24.36 kg/m     Gen: NAD/Elderly/Frail  HEENT: AT/NC  Lungs/Chest wall: Clear. Decreased BS b/l bases. No respiratory distress  Cardiovascular: Regular rate, normal rhythm.   Abdomen/GU: Soft, NT,  BS+  Ext: No peripheral edema      Current Facility-Administered Medications   Medication Dose Route Frequency Provider Last Rate Last Admin    mirtazapine (REMERON) tablet 7.5 mg  7.5 mg Oral Nightly CJyl Heinz MD   7.5 mg at 11/25/21 2126    prochlorperazine (COMPAZINE) suppository 25 mg  25 mg Rectal Q12H PRN CJyl Heinz MD        prochlorperazine (COMPAZINE) tablet 5 mg  5 mg Oral Q6H PRN DYoulanda RoysMilligram, PA-C   5 mg at 11/26/21 0955    apixaban (ELIQUIS) tablet 10 mg  10 mg Oral BID YTania Ade MD   10 mg at 11/26/21 04580   Followed by    [Derrill MemoON  11/30/2021] apixaban (ELIQUIS) tablet 5 mg  5 mg Oral BID YTania Ade MD        metoclopramide (REGLAN) injection 5 mg  5 mg IntraVENous TID YTania Ade MD   5 mg at 11/26/21 0955    pantoprazole (PROTONIX) 40 mg in sodium chloride (PF) 0.9 % 10 mL injection  40 mg IntraVENous Q24H Yi Sun, MD   40 mg at 11/25/21 1224    midodrine (PROAMATINE) tablet 2.5 mg  2.5 mg Oral BID Bleck B Ngwafang, MD   2.5 mg at 11/26/21 0955    sodium chloride flush 0.9 % injection 5-40 mL  5-40 mL IntraVENous 2 times per day Bleck B Ngwafang, MD   10 mL at 11/26/21 0955    sodium chloride flush 0.9 % injection 5-40 mL  5-40 mL IntraVENous PRN Bleck B Ngwafang, MD        0.9 % sodium chloride infusion   IntraVENous PRN Bleck B Ngwafang, MD        ondansetron (ZOFRAN-ODT) disintegrating tablet 4 mg  4 mg Oral Q8H PRN Bleck B Ngwafang, MD        Or    ondansetron (ZOFRAN) injection 4 mg  4 mg IntraVENous Q6H PRN Bleck B Ngwafang, MD   4 mg at 11/24/21 1611  polyethylene glycol (GLYCOLAX) packet 17 g  17 g Oral Daily PRN Bleck B Ngwafang, MD        acetaminophen (TYLENOL) tablet 650 mg  650 mg Oral Q6H PRN Bleck B Ngwafang, MD        Or    acetaminophen (TYLENOL) suppository 650 mg  650 mg Rectal Q6H PRN Bleck B Ngwafang, MD        predniSONE (DELTASONE) tablet 10 mg  10 mg Oral Daily Bleck B Ngwafang, MD   10 mg at 11/26/21 0955        Labs/Data:    Lab Results   Component Value Date    WBC 18.4 (H) 11/26/2021    HGB 11.3 (L) 11/26/2021    HCT 35.2 11/26/2021    MCV 100.3 (H) 11/26/2021    PLT 169 11/26/2021       Lab Results   Component Value Date/Time    NA 130 11/26/2021 04:46 AM    K 5.4 11/26/2021 04:46 AM    CL 92 11/26/2021 04:46 AM    CO2 30 11/26/2021 04:46 AM    BUN 40 11/26/2021 04:46 AM    CREATININE 1.19 11/26/2021 04:46 AM    GLUCOSE 95 11/26/2021 04:46 AM    CALCIUM 9.3 11/26/2021 04:46 AM    LABGLOM 48 11/26/2021 04:46 AM        Wt Readings from Last 3 Encounters:   11/25/21 66.3 kg (146 lb 3.2 oz)   10/08/21 64.9 kg (143 lb)        No intake or output data in the 24 hours ending 11/26/21 1301      Patient seen and examined. Chart reviewed. Labs, data and other pertinent notes reviewed in last 24 hrs.    PMH/SH/FH reviewed and unchanged compared to H&P    Discussed with patient      Logan Paradis,MD  [facility] ([place])

## 2021-11-26 NOTE — Progress Notes (Signed)
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 intake and incoherent speech for the past 24 hours.  She was admitted for further work-up.    Interval history / Subjective:     Patient remains lethargic today. Confused at times, moments of clarity. I believe she may meet criteria for inpatient hospice at this point. I had a long discussion with patients husband today about the fact that she is not a candidate for systemic chemotherapy. He had been told by her oncologist yesterday that it would be time for "systemic chemo". Unfortunately due to her poor performance status, she would not be a candidate as per Dr. Glori Bickers. She is only drinking/eating at [facility] 2 ensures daily (700 calories).     ~ 800 cc taken off right lung for thoracentesis yesterday. Husband will discuss with daughter and let us know regarding hospice.      Assessment & Plan:     Generalized weakness;  worsening of functional status;  Severe hypercalcemia: Improved  Stage IV breast cancer with metastatic disease to bones and liver;  - Oncology consulted;Currently on oral capecitabine 500 mg BID one week on, one week off.  This has been on hold since early-mid August Hold capecitabine while inpatient  - Patient has  well-established follow-up with Dr. Elinor Parkinson;   - Palliative care following. Had a discussion today with husband and daughter stephanie. As of now their goals are to return the patient to a functional level that she can return home where they can further consider hospice care.  - She is now a DNR , palliative met with family this Friday morning and they are not yet ready for hospice at that time     Tachycardia:   - she remains tachy about 120-130, etiology unclear   - general debility ? Infection ? Dehydration?  - continue to monitor on remote tele      Nausea and vomiting  - poor oral intake for a while likely 2/2 metastatic disease  - CT Abd:no bowel obstrution, has liver mets   - PRN zofran, added compazine     Extensive acute left proximal and distal vein DVT:  - Confirmed on Doppler ultrasound  - S/P heparin gtt,   - Vascular saw the patient, no intervention at this time, continue [facility]  -  Patient will need lifelong anticoagulation;   -Transitioned to po eliquis 8/30 AM. Continue loading dose of 32m BID x 2 weeks (EOT 9/5) then 563mBID indefinitely    Hypercalcemia of malignancy: improved  - Initial calcium 12.3, now 9.3  -nephro following  - S/p IV Zometa last night, S/p SQ Calcitonin (total of 4 doses)     Acute cystitis:  - Started on cefepime on admission;  - Follow urine culture results;  - Blood culture 8/27 1/2 CNS likely contamination   - Stopped Cefepime today (8/31) received 5 days ABX     Bilateral pleural effusions:  - Malignant;   - Patient had a thoracentesis about 6 weeks ago;  - She is currently on room air and in no acute respiratory distress;   - [place]/29 noticed adequate pleural effusion to be drained if indicated: if decide to home with home hospice, may consider one time tap prior discharge if increasing tachypnea   - s/p right sided thoracentesis on 9/1 with ~ 800 cc drained off      Orthostatic hypotension  - Home BP medications held on admission;  - continue Midodrine      Hyponatremia:  -  Likely hypovolemic, corrected overnight;     Dysphagia:   Per family  Having issue of solid food for a while per family   -supplement ensure     Code status: DNR  Prophylaxis: Eliquis   Care Plan discussed with: Patient, husband, daughter, RN  Anticipated Disposition: TBD           Review of Systems:   Pertinent items are noted in HPI.       Vital Signs:    Last 24hrs VS reviewed since prior progress note. Most recent are:  Vitals:    11/26/21 1955   BP:    Pulse: (!) 131   Resp:    Temp:    SpO2:        No intake or output data in the 24 hours ending 11/26/21 2124       Physical Examination:     I had a face to face encounter with this patient and independently examined them on 11/26/2021 as outlined below:          Constitutional:  No acute distress, ill appearing, lethargic   ENT:  Oral mucosa moist, oropharynx benign.    Resp:  CTA bilaterally. No wheezing/rhonchi/rales. No accessory muscle use.    CV:  Regular rhythm, normal rate, no murmurs, gallops, rubs    GI:  Soft, non distended, non tender. normoactive bowel sounds, no hepatosplenomegaly     Musculoskeletal:  LLE mildly larger than RLE, warm, 2+ pulses throughout    Neurologic:  Moves all extremities.  AAOx3, CN II-XII Grossly intact            Data Review:    Review and/or order of clinical lab test  Review and/or order of tests in the radiology section of CPT  Review and/or order of tests in the medicine section of CPT      Labs:     Recent Labs     11/25/21  0558 11/26/21  0446   WBC 16.9* 18.4*   HGB 11.1* 11.3*   HCT 35.5 35.2   PLT 176 169       Recent Labs     11/24/21  0530 11/25/21  0558 11/26/21  0446   NA 133* 130* 130*   K 3.3* 3.5 5.4*   CL  97 94* 92*   CO2 '30 31 30   ' BUN 40* 40* 40*   MG 1.8 1.7 2.0   PHOS 1.3* 1.3* 2.1*       Recent Labs     11/24/21  0530 11/25/21  0558 11/26/21  0446   ALT 52 52 71   GLOB 3.7 3.6 3.8       No results for input(s): INR, APTT in the last 72 hours.    Invalid input(s): PTP   No results for input(s): TIBC, FERR  in the last 72 hours.    Invalid input(s): FE, PSAT   No results found for: FOL, RBCF   No results for input(s): PH, PCO2, PO2 in the last 72 hours.  No results for input(s): CPK in the last 72 hours.    Invalid input(s): CPKMB, CKNDX, TROIQ  No results found for: CHOL, CHOLX, CHLST, CHOLV, HDL, HDLC, LDL, LDLC, TGLX, TRIGL  No results found for: GLUCPOC  '@LABUA' @      Medications Reviewed:     Current Facility-Administered Medications   Medication Dose Route Frequency    mirtazapine (REMERON) tablet 7.5 mg  7.5 mg Oral Nightly    prochlorperazine (COMPAZINE) suppository 25 mg  25 mg Rectal Q12H PRN    prochlorperazine (COMPAZINE) tablet 5 mg  5 mg Oral Q6H PRN    apixaban (ELIQUIS) tablet 10 mg  10 mg Oral BID    Followed by    Derrill Memo ON 11/30/2021] apixaban (ELIQUIS) tablet 5 mg  5 mg Oral BID    metoclopramide (REGLAN) injection 5 mg  5 mg IntraVENous TID    pantoprazole (PROTONIX) 40 mg in sodium chloride (PF) 0.9 % 10 mL injection  40 mg IntraVENous Q24H    midodrine (PROAMATINE) tablet 2.5 mg  2.5 mg Oral BID    sodium chloride flush 0.9 % injection 5-40 mL  5-40 mL IntraVENous 2 times per day    sodium chloride flush 0.9 % injection 5-40 mL  5-40 mL IntraVENous PRN    0.9 % sodium chloride infusion   IntraVENous PRN    ondansetron (ZOFRAN-ODT) disintegrating tablet 4 mg  4 mg Oral Q8H PRN    Or    ondansetron (ZOFRAN) injection 4 mg  4 mg IntraVENous Q6H PRN    polyethylene glycol (GLYCOLAX) packet 17 g  17 g Oral Daily PRN    acetaminophen (TYLENOL) tablet 650 mg  650 mg Oral Q6H PRN    Or    acetaminophen (TYLENOL) suppository 650 mg  650 mg Rectal Q6H PRN    predniSONE (DELTASONE) tablet 10 mg  10 mg Oral Daily     ______________________________________________________________________  EXPECTED LENGTH OF STAY: 5  ACTUAL LENGTH OF STAY:          6                 [name], APRN - NP

## 2021-11-27 MED FILL — MIDODRINE HCL 5 MG PO TABS: 5 MG | ORAL | Qty: 1

## 2021-11-27 MED FILL — METOCLOPRAMIDE HCL 5 MG/ML IJ SOLN: 5 MG/ML | INTRAMUSCULAR | Qty: 2

## 2021-11-27 MED FILL — PANTOPRAZOLE SODIUM 40 MG IV SOLR: 40 MG | INTRAVENOUS | Qty: 40

## 2021-11-27 MED FILL — ELIQUIS 5 MG PO TABS: 5 MG | ORAL | Qty: 2

## 2021-11-27 MED FILL — ONDANSETRON HCL 4 MG/2ML IJ SOLN: 4 MG/2ML | INTRAMUSCULAR | Qty: 2

## 2021-11-27 MED FILL — MIRTAZAPINE 15 MG PO TABS: 15 MG | ORAL | Qty: 1

## 2021-11-27 MED FILL — PREDNISONE 10 MG PO TABS: 10 MG | ORAL | Qty: 1

## 2021-11-27 NOTE — Progress Notes (Signed)
Name: Ariel Day   MRN: [id number]  DOB: December 11, 1945    Nephrology Progress Note    Assessment:     AKI: Serum Cr 1.'3mg'$ /dl->> improved to 1.'1mg'$ /dl and holding. Suspected pre-renal state.      Hypercalcemia: Peak corrected Ca 13.4. Improving with Calcitonin/Zometa (8/28). Corrected Ca today down to 10.3. Etiology likely 2 to metastatic Breast CA.     Combined Systolic/Diastolic CHF: +pulm edema/b/l pleural effusions on presentation     Metastatic Breast CA     Staph Bacteremia-> contaminant    Hypokalemia/Hypophosphatemia    Hyponatremia: mild    Dysphagia    Serum K hemolyzed yesterday    Plan/Recommendations:  Awaiting lab work-> can obtain tomorrow  Ct to encourage intake as tolerated  Trend Calcium level  Overall prognosis guarded/poor-palliative care consulting. Now DNR  AM labs    Subjective:  Husband at bedside. Patient denies any recent n/v. Drinking Ensure. Not tolerating solids still.     ROS:   No CP. No SOB.    Exam:  BP 135/87   Pulse (!) 121   Temp 97.5 F (36.4 C) (Oral)   Resp 16   Ht 1.65 m (5' 4.96")   Wt 66.3 kg (146 lb 3.2 oz)   SpO2 96%   BMI 24.36 kg/m     Gen: NAD/Elderly/Frail  HEENT: AT/NC  Lungs/Chest wall: Clear. Decreased BS b/l bases. No respiratory distress  Cardiovascular: Regular rate, normal rhythm.   Abdomen/GU: Soft, NT,  BS+  Ext: No peripheral edema      Current Facility-Administered Medications   Medication Dose Route Frequency Provider Last Rate Last Admin    mirtazapine (REMERON) tablet 7.5 mg  7.5 mg Oral Nightly Jyl Heinz, MD   7.5 mg at 11/27/21 0045    prochlorperazine (COMPAZINE) suppository 25 mg  25 mg Rectal Q12H PRN Jyl Heinz, MD        prochlorperazine (COMPAZINE) tablet 5 mg  5 mg Oral Q6H PRN Youlanda Roys Milligram, PA-C   5 mg at 11/26/21 0955    apixaban (ELIQUIS) tablet 10 mg  10 mg Oral BID Tania Ade, MD   10  mg at 11/27/21 1829    Followed by    Derrill Memo ON 11/30/2021] apixaban (ELIQUIS) tablet 5 mg  5 mg Oral BID Tania Ade, MD        metoclopramide (REGLAN) injection 5 mg  5 mg IntraVENous TID Tania Ade, MD   5 mg at 11/27/21 0951    pantoprazole (PROTONIX) 40 mg in sodium chloride (PF) 0.9 % 10 mL injection  40 mg IntraVENous Q24H Yi Sun, MD   40 mg at 11/27/21 1214    midodrine (PROAMATINE) tablet 2.5 mg  2.5 mg Oral BID Bleck B Ngwafang, MD   2.5 mg at 11/27/21 0951    sodium chloride flush 0.9 % injection 5-40 mL  5-40 mL IntraVENous 2 times per day Bleck B Ngwafang, MD   10 mL at 11/27/21 0951    sodium chloride flush 0.9 % injection 5-40 mL  5-40 mL IntraVENous PRN Bleck B Ngwafang, MD        0.9 % sodium chloride infusion   IntraVENous PRN Bleck B Ngwafang, MD        ondansetron (ZOFRAN-ODT) disintegrating tablet 4 mg  4 mg Oral Q8H PRN Bleck B Ngwafang, MD        Or    ondansetron (ZOFRAN) injection 4 mg  4 mg IntraVENous Q6H PRN Bleck B Ngwafang, MD  4 mg at 11/24/21 1611    polyethylene glycol (GLYCOLAX) packet 17 g  17 g Oral Daily PRN Bleck B Ngwafang, MD        acetaminophen (TYLENOL) tablet 650 mg  650 mg Oral Q6H PRN Bleck B Ngwafang, MD        Or    acetaminophen (TYLENOL) suppository 650 mg  650 mg Rectal Q6H PRN Bleck B Ngwafang, MD        predniSONE (DELTASONE) tablet 10 mg  10 mg Oral Daily Bleck B Ngwafang, MD   10 mg at 11/27/21 0950        Labs/Data:    Lab Results   Component Value Date    WBC 18.4 (H) 11/26/2021    HGB 11.3 (L) 11/26/2021    HCT 35.2 11/26/2021    MCV 100.3 (H) 11/26/2021    PLT 169 11/26/2021       Lab Results   Component Value Date/Time    NA 130 11/26/2021 04:46 AM    K 5.4 11/26/2021 04:46 AM    CL 92 11/26/2021 04:46 AM    CO2 30 11/26/2021 04:46 AM    BUN 40 11/26/2021 04:46 AM    CREATININE 1.19 11/26/2021 04:46 AM    GLUCOSE 95 11/26/2021 04:46 AM    CALCIUM 9.3 11/26/2021 04:46 AM    LABGLOM 48 11/26/2021 04:46 AM        Wt Readings from Last 3 Encounters:   11/25/21 66.3 kg  (146 lb 3.2 oz)   10/08/21 64.9 kg (143 lb)       No intake or output data in the 24 hours ending 11/27/21 1317      Patient seen and examined. Chart reviewed. Labs, data and other pertinent notes reviewed in last 24 hrs.    PMH/SH/FH reviewed and unchanged compared to H&P    Discussed with patient/husband      Athina Fahey,MD  [facility] ([place])

## 2021-11-27 NOTE — Plan of Care (Signed)
Problem: Safety - Adult  Goal: Free from fall injury  Outcome: Progressing  Flowsheets (Taken 11/27/2021 1116)  Free From Fall Injury: Instruct family/caregiver on patient safety

## 2021-11-27 NOTE — Progress Notes (Cosign Needed)
[facility]                                                                                          Hospitalist Progress Note  Clearnce Hasten, APRN - NP  Answering service: [phone] OR [phone] from in house phone        Date of Service:  11/27/2021  NAME:  Ariel Day  DOB:  [birth year]  MRN:  [id number]      Admission Summary:   Patient is a 76 year old female with a past medical history of stage IV breast cancer with metastatic disease to bones and liver, on oral chemotherapy, history of combined systolic and diastolic congestive heart failure, status post ICD, orthostatic hypotension, GERD, and cancer related chronic pain, who came to the emergency room due to generalized weakness, worsening confusion, feeling weak and tired, decreased p.o. intake and incoherent speech for the past 24 hours.  She was admitted for further work-up.    Interval history / Subjective:     Patient is confused at times, moments of clarity. I believe she may meet criteria for inpatient hospice at this point. I had a long discussion with patients husband yesterday about the fact that she is not a candidate for systemic chemotherapy. He had been told by her oncologist yesterday that it would be time for "systemic chemo". Unfortunately due to her poor performance status, she would not be a candidate as per Dr. Monico Hoar. She is only drinking/eating at [facility] 2 ensures daily (700 calories).     ~ 800 cc taken off right lung for thoracentesis Friday. Husband will discuss with daughter and let us know regarding hospice.     Today she had nausea/vomiting after only drinking ensures. She does appear more alert and bright today. Less lethargic.      Assessment & Plan:     Generalized weakness;  worsening of functional status;  Severe hypercalcemia: Improved  Stage IV breast cancer with metastatic disease to bones and liver;  - Oncology consulted;Currently on oral capecitabine 500 mg BID one week on, one week  off.  This has been on hold since early-mid August Hold capecitabine while inpatient  - Patient has well-established follow-up with Dr. Jed Limerick;   - Palliative care following. Had a discussion today with husband and daughter stephanie. As of now their goals are to return the patient to a functional level that she can return home where they can further consider hospice care.  - She is now a DNR , palliative met with family Friday morning and they are not yet ready for hospice at that time     Tachycardia:   - she remains tachy about 120-130, etiology unclear   - general debility ? Infection ? Dehydration?  - continue to monitor on remote tele      Nausea and vomiting  - poor oral intake for a while likely 2/2 metastatic disease  - CT Abd:no bowel obstrution, has liver mets   - PRN zofran, added compazine     Extensive acute left proximal and distal vein DVT:  - Confirmed on Doppler ultrasound  -  S/P heparin gtt,   - Vascular saw the patient, no intervention at this time, continue [facility]  - Patient will need lifelong anticoagulation;   -Transitioned to po eliquis 8/30 AM. Continue loading dose of 10mg  BID x 2 weeks (EOT 9/5) then 5mg  BID indefinitely    Hypercalcemia of malignancy: improved  - Initial calcium 12.3, now 9.3  -nephro following  - S/p IV Zometa, S/p SQ Calcitonin (total of 4 doses)     Acute cystitis:  - Started on cefepime on admission;  - Follow urine culture results;  - Blood culture 8/27 1/2 CNS likely contamination   - Stopped Cefepime today (8/31) received 5 days ABX     Bilateral pleural effusions:  - Malignant;   - Patient had a thoracentesis about 6 weeks ago;  - She is currently on room air and in no acute respiratory distress;   - [place] 8/29 noticed adequate pleural effusion to be drained if indicated: if decide to home with home hospice, may consider one time tap prior discharge if increasing tachypnea   - s/p right sided thoracentesis on 9/1 with ~ 800 cc drained off      Orthostatic hypotension  -  Home BP medications held on admission;  - continue Midodrine      Hyponatremia:  - Likely hypovolemic, corrected overnight;     Dysphagia:   Per family  Having issue of solid food for a while per family   -supplement ensure     Code status: DNR  Prophylaxis: Eliquis   Care Plan discussed with: Patient, husband, daughter, RN  Anticipated Disposition: TBD           Review of Systems:   Pertinent items are noted in HPI.       Vital Signs:    Last 24hrs VS reviewed since prior progress note. Most recent are:  Vitals:    11/27/21 1608   BP: 126/82   Pulse: (!) 119   Resp: 16   Temp: 97.9 F (36.6 C)   SpO2: 95%       No intake or output data in the 24 hours ending 11/27/21 1631       Physical Examination:     I had a face to face encounter with this patient and independently examined them on 11/27/2021 as outlined below:          Constitutional:  No acute distress, ill appearing, lethargic   ENT:  Oral mucosa moist, oropharynx benign.    Resp:  CTA bilaterally. No wheezing/rhonchi/rales. No accessory muscle use.    CV:  Regular rhythm, normal rate, no murmurs, gallops, rubs    GI:  Soft, non distended, non tender. normoactive bowel sounds, no hepatosplenomegaly     Musculoskeletal:  LLE mildly larger than RLE, warm, 2+ pulses throughout    Neurologic:  Moves all extremities.  AAOx3, CN II-XII Grossly intact            Data Review:    Review and/or order of clinical lab test  Review and/or order of tests in the radiology section of CPT  Review and/or order of tests in the medicine section of CPT      Labs:     Recent Labs     11/25/21  0558 11/26/21  0446   WBC 16.9* 18.4*   HGB 11.1* 11.3*   HCT 35.5 35.2   PLT 176 169       Recent Labs     11/25/21  0558 11/26/21  0446   NA 130* 130*   K 3.5 5.4*   CL 94* 92*   CO2 31 30   BUN 40* 40*   MG 1.7 2.0   PHOS 1.3* 2.1*       Recent Labs     11/25/21  0558 11/26/21  0446   ALT 52 71   GLOB 3.6 3.8       No results for input(s): INR, APTT in the last 72 hours.    Invalid  input(s): PTP   No results for input(s): TIBC, FERR in the last 72 hours.    Invalid input(s): FE, PSAT   No results found for: FOL, RBCF   No results for input(s): PH, PCO2, PO2 in the last 72 hours.  No results for input(s): CPK in the last 72 hours.    Invalid input(s): CPKMB, CKNDX, TROIQ  No results found for: CHOL, CHOLX, CHLST, CHOLV, HDL, HDLC, LDL, LDLC, TGLX, TRIGL  No results found for: GLUCPOC  @LABUA @      Medications Reviewed:     Current Facility-Administered Medications   Medication Dose Route Frequency    mirtazapine (REMERON) tablet 7.5 mg  7.5 mg Oral Nightly    prochlorperazine (COMPAZINE) suppository 25 mg  25 mg Rectal Q12H PRN    prochlorperazine (COMPAZINE) tablet 5 mg  5 mg Oral Q6H PRN    apixaban (ELIQUIS) tablet 10 mg  10 mg Oral BID    Followed by    Melene Muller ON 11/30/2021] apixaban (ELIQUIS) tablet 5 mg  5 mg Oral BID    metoclopramide (REGLAN) injection 5 mg  5 mg IntraVENous TID    pantoprazole (PROTONIX) 40 mg in sodium chloride (PF) 0.9 % 10 mL injection  40 mg IntraVENous Q24H    midodrine (PROAMATINE) tablet 2.5 mg  2.5 mg Oral BID    sodium chloride flush 0.9 % injection 5-40 mL  5-40 mL IntraVENous 2 times per day    sodium chloride flush 0.9 % injection 5-40 mL  5-40 mL IntraVENous PRN    0.9 % sodium chloride infusion   IntraVENous PRN    ondansetron (ZOFRAN-ODT) disintegrating tablet 4 mg  4 mg Oral Q8H PRN    Or    ondansetron (ZOFRAN) injection 4 mg  4 mg IntraVENous Q6H PRN    polyethylene glycol (GLYCOLAX) packet 17 g  17 g Oral Daily PRN    acetaminophen (TYLENOL) tablet 650 mg  650 mg Oral Q6H PRN    Or    acetaminophen (TYLENOL) suppository 650 mg  650 mg Rectal Q6H PRN    predniSONE (DELTASONE) tablet 10 mg  10 mg Oral Daily     ______________________________________________________________________  EXPECTED LENGTH OF STAY: 5  ACTUAL LENGTH OF STAY:          7                 Clearnce Hasten, APRN - NP

## 2021-11-28 LAB — COMPREHENSIVE METABOLIC PANEL
ALT: 66 U/L (ref 12–78)
AST: 61 U/L — ABNORMAL HIGH (ref 15–37)
Albumin/Globulin Ratio: 0.6 — ABNORMAL LOW (ref 1.1–2.2)
Albumin: 2.2 g/dL — ABNORMAL LOW (ref 3.5–5.0)
Alk Phosphatase: 358 U/L — ABNORMAL HIGH (ref 45–117)
Anion Gap: 8 mmol/L (ref 5–15)
BUN: 40 MG/DL — ABNORMAL HIGH (ref 6–20)
Bun/Cre Ratio: 37 — ABNORMAL HIGH (ref 12–20)
CO2: 31 mmol/L (ref 21–32)
Calcium: 9.1 MG/DL (ref 8.5–10.1)
Chloride: 91 mmol/L — ABNORMAL LOW (ref 97–108)
Creatinine: 1.08 MG/DL — ABNORMAL HIGH (ref 0.55–1.02)
Est, Glom Filt Rate: 54 mL/min/{1.73_m2} — ABNORMAL LOW (ref >60)
Globulin: 3.6 g/dL (ref 2.0–4.0)
Glucose: 112 mg/dL — ABNORMAL HIGH (ref 65–100)
Potassium: 3.5 mmol/L (ref 3.5–5.1)
Sodium: 130 mmol/L — ABNORMAL LOW (ref 136–145)
Total Bilirubin: 0.7 MG/DL (ref 0.2–1.0)
Total Protein: 5.8 g/dL — ABNORMAL LOW (ref 6.4–8.2)

## 2021-11-28 LAB — CBC WITH AUTO DIFFERENTIAL
Absolute Immature Granulocyte: 0.3 10*3/uL — ABNORMAL HIGH (ref 0.00–0.04)
Basophils %: 0 % (ref 0–1)
Basophils Absolute: 0 10*3/uL (ref 0.0–0.1)
Eosinophils %: 1 % (ref 0–7)
Eosinophils Absolute: 0.1 10*3/uL (ref 0.0–0.4)
Hematocrit: 36.8 % (ref 35.0–47.0)
Hemoglobin: 11.7 g/dL (ref 11.5–16.0)
Immature Granulocytes: 2 % — ABNORMAL HIGH (ref 0.0–0.5)
Lymphocytes %: 8 % — ABNORMAL LOW (ref 12–49)
Lymphocytes Absolute: 1.1 10*3/uL (ref 0.8–3.5)
MCH: 32.1 PG (ref 26.0–34.0)
MCHC: 31.8 g/dL (ref 30.0–36.5)
MCV: 100.8 FL — ABNORMAL HIGH (ref 80.0–99.0)
MPV: 10.8 FL (ref 8.9–12.9)
Monocytes %: 10 % (ref 5–13)
Monocytes Absolute: 1.4 10*3/uL — ABNORMAL HIGH (ref 0.0–1.0)
Neutrophils %: 79 % — ABNORMAL HIGH (ref 32–75)
Neutrophils Absolute: 11 10*3/uL — ABNORMAL HIGH (ref 1.8–8.0)
Nucleated RBCs: 0.1 PER 100 WBC — ABNORMAL HIGH
Platelets: 227 10*3/uL (ref 150–400)
RBC: 3.65 M/uL — ABNORMAL LOW (ref 3.80–5.20)
RDW: 17.5 % — ABNORMAL HIGH (ref 11.5–14.5)
WBC: 13.9 10*3/uL — ABNORMAL HIGH (ref 3.6–11.0)
nRBC: 0.02 10*3/uL — ABNORMAL HIGH (ref 0.00–0.01)

## 2021-11-28 LAB — PHOSPHORUS: Phosphorus: 1.6 MG/DL — ABNORMAL LOW (ref 2.6–4.7)

## 2021-11-28 LAB — MAGNESIUM: Magnesium: 1.7 mg/dL (ref 1.6–2.4)

## 2021-11-28 MED ORDER — POTASSIUM PHOSPHATE 3 MMOL/ML IV SOLN (MIXTURES ONLY)
155 mmol/5 mL | Freq: Once | INTRAVENOUS | Status: AC
Start: 2021-11-28 — End: 2021-11-28
  Administered 2021-11-28: 19:00:00 20 mmol via INTRAVENOUS

## 2021-11-28 MED FILL — ELIQUIS 5 MG PO TABS: 5 MG | ORAL | Qty: 2

## 2021-11-28 MED FILL — MIDODRINE HCL 5 MG PO TABS: 5 MG | ORAL | Qty: 1

## 2021-11-28 MED FILL — PREDNISONE 10 MG PO TABS: 10 MG | ORAL | Qty: 1

## 2021-11-28 MED FILL — PANTOPRAZOLE SODIUM 40 MG IV SOLR: 40 MG | INTRAVENOUS | Qty: 40

## 2021-11-28 MED FILL — POTASSIUM PHOSPHATES 45 MMOLE/15ML IV SOLN: 45 MMOLE/15ML | INTRAVENOUS | Qty: 6.67

## 2021-11-28 MED FILL — MIRTAZAPINE 15 MG PO TABS: 15 MG | ORAL | Qty: 1

## 2021-11-28 MED FILL — METOCLOPRAMIDE HCL 5 MG/ML IJ SOLN: 5 MG/ML | INTRAMUSCULAR | Qty: 2

## 2021-11-28 NOTE — Progress Notes (Signed)
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 intake and incoherent speech for the past 24 hours.  She was admitted for further work-up.    Interval history / Subjective:     Patient is confused at times, moments of clarity. Patients labs have now stabilized out somewhat - corrected Ca hovering in the mid 10's now. She continues to be extremely weak and only able to sit edge of bed during last PT session. She is still only eating about 5-700 calories a day via ensure drinks. The family discussed her care Friday with her long time oncologist in [facility], Dr. Elinor Day who stated that she needs systemic chemo at this point. I do not think clinically she would tolerate systemic chemo. Will defer that to Dr. Glori Day.  Family is interested in moving oncology care to Dr. Glori Day as she does not wish to travel to [facility] any longer.     ~ 800 cc taken off right lung for thoracentesis Friday. Would benefit from therapeutic left sided thora potentially before discharge.     Plan:   - PT/OT re-eval tomorrow to see if she would even be a candidate for SNF   - Discussed with Dr. Glori Day today who will round on her again tomorrow and call daughter with thoughts about systemic chemo- but  thinking she is not a candidate for this and needs hospice as of her visit on Friday   - Family told today that they will need to make a decision tomorrow after PT reccs for home with hospice vs. SNF      Assessment & Plan:     Generalized weakness;  worsening of functional status;  Severe hypercalcemia: Improved  Stage IV breast cancer with metastatic disease to bones and liver;  - Oncology consulted;Currently on oral capecitabine 500 mg BID one week on, one week off.  This has been on hold since early-mid August Hold capecitabine while inpatient  - Patient has well-established follow-up with Dr. Elinor Day;   - She is now a DNR , palliative met with family Friday morning and they are not yet ready for hospice at that time but still considering options   - Dr. Glori Day to re-eval and talk with family tomorrow     Tachycardia:   - she remains tachy about 120-130,  etiology unclear   - general debility ? Infection ? Dehydration?  - continue to monitor on remote tele      Nausea and vomiting  - poor oral intake for a while likely 2/2 metastatic disease  - CT Abd:no bowel obstrution, has liver mets   - PRN zofran, added compazine     Extensive acute left proximal and distal vein DVT:  - Confirmed on Doppler ultrasound  - S/P heparin gtt,   - Vascular saw the patient, no intervention at this time, continue [facility]  - Patient will need lifelong anticoagulation;   -Transitioned to po eliquis 8/30 AM. Continue loading dose of 71m BID x 2 weeks (EOT 9/5) then 536mBID indefinitely    Hypercalcemia of malignancy: improved  - Initial calcium 12.3, now corrected to mid 10's and hovering there   -nephro following  - S/p IV Zometa, S/p SQ Calcitonin (total of 4 doses)     Acute cystitis:  - Started on cefepime on admission;  - Follow urine culture results;  - Blood culture 8/27 1/2 CNS likely contamination   - Stopped Cefepime (8/31) received 5 days ABX     Bilateral pleural effusions:  - Malignant;   - Patient had a thoracentesis about 6  weeks ago;  - She is currently on room air and in no acute respiratory distress;   - [place]/29 noticed adequate pleural effusion to be drained if indicated: if decide to home with home hospice, may consider one time tap prior discharge if increasing tachypnea   - s/p right sided thoracentesis on 9/1 with ~ 800 cc drained off   - would benefit from left sided thora prior to discharge      Orthostatic hypotension  - Home BP medications held on admission;  - continue Midodrine      Hyponatremia:  - Likely hypovolemic, corrected to 130     Hypophosphatemia   - phosphorus 1.6   - likely related to malnutrition and advanced cancer state   - replete today      Dysphagia:   Per family  Having issue of solid food for a while per family   Pureed diet   -supplement ensure     Code status: DNR  Prophylaxis: Eliquis   Care Plan discussed with: Patient, husband, daughter, RN, Dr. RaGlori BickersDr. MaArmy Day Anticipated Disposition: 24-48 hours            Review of Systems:   Pertinent items are noted in HPI.       Vital Signs:    Last 24hrs VS reviewed since prior progress note. Most recent are:  Vitals:    11/28/21 1529   BP: 116/70   Pulse: (!) 129   Resp: 16   Temp: 97.3 F (36.3 C)   SpO2: 95%         Intake/Output Summary (Last 24 hours) at 11/28/2021 1540  Last data filed at 11/27/2021 1608  Gross per 24 hour   Intake --   Output 450 ml   Net -450 ml          Physical Examination:     I had a face to face encounter with this patient and independently examined them on 11/28/2021 as outlined below:          Constitutional:  No acute distress, ill appearing, lethargic   ENT:  Oral mucosa moist, oropharynx benign.    Resp:  CTA bilaterally. No wheezing/rhonchi/rales. No accessory muscle use.  CV:  Regular rhythm, normal rate, no murmurs, gallops, rubs    GI:  Soft, non distended, non tender. normoactive bowel sounds, no hepatosplenomegaly     Musculoskeletal:  LLE mildly larger than RLE, warm, 2+ pulses throughout    Neurologic:  Moves  all extremities.  AAOx2-3 ( waxes and wanes)             Data Review:    Review and/or order of clinical lab test  Review and/or order of tests in the radiology section of CPT  Review and/or order of tests in the medicine section of CPT      Labs:     Recent Labs     11/26/21  0446 11/28/21  1021   WBC 18.4* 13.9*   HGB 11.3* 11.7   HCT 35.2 36.8   PLT 169 227       Recent Labs     11/26/21  0446 11/28/21  1021   NA 130* 130*   K 5.4* 3.5   CL 92* 91*   CO2 30 31   BUN 40* 40*   MG 2.0 1.7   PHOS 2.1* 1.6*       Recent Labs     11/26/21  0446 11/28/21  1021   ALT 71 66   GLOB 3.8 3.6       No results for input(s): INR, APTT in the last 72 hours.    Invalid input(s): PTP   No results for input(s): TIBC, FERR in the last 72 hours.    Invalid input(s): FE, PSAT   No results found for: FOL, RBCF   No results for input(s): PH, PCO2, PO2 in the last 72 hours.  No results for input(s): CPK in the last 72 hours.    Invalid input(s): CPKMB, CKNDX, TROIQ  No results found for: CHOL, CHOLX, CHLST, CHOLV, HDL, HDLC, LDL, LDLC, TGLX, TRIGL  No results found for: GLUCPOC  _0 @      Medications Reviewed:     Current Facility-Administered Medications   Medication Dose Route Frequency    potassium phosphate 20 mmol in sodium chloride 0.9 % 250 mL IVPB  20 mmol IntraVENous Once    mirtazapine (REMERON) tablet 7.5 mg  7.5 mg Oral Nightly    prochlorperazine (COMPAZINE) suppository 25 mg  25 mg Rectal Q12H PRN    prochlorperazine (COMPAZINE) tablet 5 mg  5 mg Oral Q6H PRN    apixaban (ELIQUIS) tablet 10 mg  10 mg Oral BID    Followed by    Derrill Memo ON 11/30/2021] apixaban (ELIQUIS) tablet 5 mg  5 mg Oral BID    metoclopramide (REGLAN) injection 5 mg  5 mg IntraVENous TID    pantoprazole (PROTONIX) 40 mg in sodium chloride (PF) 0.9 % 10 mL injection  40 mg IntraVENous Q24H    midodrine (PROAMATINE) tablet 2.5 mg  2.5 mg Oral BID    sodium chloride flush 0.9 % injection 5-40 mL  5-40 mL IntraVENous 2 times per day    sodium chloride flush  0.9 % injection 5-40 mL  5-40 mL IntraVENous PRN    0.9 % sodium chloride infusion   IntraVENous PRN    ondansetron (ZOFRAN-ODT) disintegrating tablet 4 mg  4 mg Oral Q8H PRN    Or    ondansetron (ZOFRAN) injection 4 mg  4 mg IntraVENous Q6H PRN    polyethylene glycol (GLYCOLAX) packet 17 g  17 g Oral Daily PRN    acetaminophen (TYLENOL) tablet 650 mg  650 mg Oral Q6H PRN    Or    acetaminophen (TYLENOL) suppository 650 mg  650 mg Rectal Q6H PRN    predniSONE (DELTASONE) tablet 10 mg  10 mg Oral Daily     ______________________________________________________________________  EXPECTED LENGTH OF STAY: 5  ACTUAL LENGTH OF STAY:          8                 Ariel Allegra, APRN - NP

## 2021-11-28 NOTE — Plan of Care (Signed)
Problem: Discharge Planning  Goal: Discharge to home or other facility with appropriate resources  11/28/2021 1057 by Daphene Jaeger, RN  Outcome: Progressing  11/28/2021 1055 by Daphene Jaeger, RN  Outcome: Progressing  Flowsheets (Taken 11/28/2021 1017)  Discharge to home or other facility with appropriate resources: Identify barriers to discharge with patient and caregiver     Problem: Safety - Adult  Goal: Free from fall injury  11/28/2021 1057 by Daphene Jaeger, RN  Outcome: Progressing  11/28/2021 1055 by Daphene Jaeger, RN  Outcome: Progressing

## 2021-11-28 NOTE — Plan of Care (Signed)
Problem: Discharge Planning  Goal: Discharge to home or other facility with appropriate resources  Outcome: Progressing  Flowsheets (Taken 11/28/2021 1017)  Discharge to home or other facility with appropriate resources: Identify barriers to discharge with patient and caregiver     Problem: Safety - Adult  Goal: Free from fall injury  Outcome: Progressing

## 2021-11-28 NOTE — Progress Notes (Signed)
Name: Ariel Day   MRN: [id number]  DOB: October 05, 1945    Nephrology Progress Note    Assessment:     AKI: Serum Cr 1.'3mg'$ /dl->> improved to 1.1-> 1.'0mg'$ /dl and holding steady. Suspected pre-renal state.      Hypercalcemia: Peak corrected Ca 13.4. Improving with Calcitonin/Zometa (8/28). Corrected Ca hovering in the mid 10s now.  Etiology likely 2 to metastatic Breast CA.     Combined Systolic/Diastolic CHF: +pulm edema/b/l pleural effusions on presentation     Metastatic Breast CA     Staph Bacteremia-> contaminant    Hypokalemia/Hypophosphatemia    Hyponatremia: mild/stable    Dysphagia      Plan/Recommendations:  Ct to encourage intake as tolerated-> change to puree diet  IV Kphos 95mol x1 dose  Trend Calcium level  Overall prognosis guarded/poor-palliative care consulting. Now DNR  AM labs    Subjective:  Husband at bedside. Patient denies any recent n/v. Drinking Ensure. Not tolerating solids -> states its mostly due to her teeth and inability to bite appropriately.     ROS:   No CP. No SOB.    Exam:  BP 105/71   Pulse (!) 124   Temp 97.6 F (36.4 C) (Oral)   Resp 16   Ht 1.65 m (5' 4.96")   Wt 66.3 kg (146 lb 3.2 oz)   SpO2 95%   BMI 24.36 kg/m     Gen: NAD/Elderly/Frail  HEENT: AT/NC  Lungs/Chest wall: Clear. Decreased BS b/l bases. No respiratory distress  Cardiovascular: Regular rate, normal rhythm.   Abdomen/GU: Soft, NT,  BS+  Ext: No peripheral edema      Current Facility-Administered Medications   Medication Dose Route Frequency Provider Last Rate Last Admin    mirtazapine (REMERON) tablet 7.5 mg  7.5 mg Oral Nightly CJyl Heinz MD   7.5 mg at 11/27/21 2126    prochlorperazine (COMPAZINE) suppository 25 mg  25 mg Rectal Q12H PRN CJyl Heinz MD        prochlorperazine (COMPAZINE) tablet 5 mg  5 mg Oral Q6H PRN DYoulanda RoysMilligram, PA-C   5 mg at  11/26/21 06948   apixaban (ELIQUIS) tablet 10 mg  10 mg Oral BID YTania Ade MD   10 mg at 11/28/21 1017    Followed by    [Derrill MemoON 11/30/2021] apixaban (ELIQUIS) tablet 5 mg  5 mg Oral BID YTania Ade MD        metoclopramide (REGLAN) injection 5 mg  5 mg IntraVENous TID YTania Ade MD   5 mg at 11/28/21 1017    pantoprazole (PROTONIX) 40 mg in sodium chloride (PF) 0.9 % 10 mL injection  40 mg IntraVENous Q24H Yi Sun, MD   40 mg at 11/28/21 1302    midodrine (PROAMATINE) tablet 2.5 mg  2.5 mg Oral BID Bleck B Ngwafang, MD   2.5 mg at 11/28/21 1017    sodium chloride flush 0.9 % injection 5-40 mL  5-40 mL IntraVENous 2 times per day Bleck B Ngwafang, MD   10 mL at 11/28/21 1020    sodium chloride flush 0.9 % injection 5-40 mL  5-40 mL IntraVENous PRN Bleck B Ngwafang, MD        0.9 % sodium chloride infusion   IntraVENous PRN Bleck B Ngwafang, MD        ondansetron (ZOFRAN-ODT) disintegrating tablet 4 mg  4 mg Oral Q8H PRN Bleck B Ngwafang, MD        Or    ondansetron (  ZOFRAN) injection 4 mg  4 mg IntraVENous Q6H PRN Bleck B Ngwafang, MD   4 mg at 11/27/21 1333    polyethylene glycol (GLYCOLAX) packet 17 g  17 g Oral Daily PRN Bleck B Ngwafang, MD        acetaminophen (TYLENOL) tablet 650 mg  650 mg Oral Q6H PRN Bleck B Ngwafang, MD        Or    acetaminophen (TYLENOL) suppository 650 mg  650 mg Rectal Q6H PRN Bleck B Ngwafang, MD        predniSONE (DELTASONE) tablet 10 mg  10 mg Oral Daily Bleck B Ngwafang, MD   10 mg at 11/28/21 1017        Labs/Data:    Lab Results   Component Value Date    WBC 13.9 (H) 11/28/2021    HGB 11.7 11/28/2021    HCT 36.8 11/28/2021    MCV 100.8 (H) 11/28/2021    PLT 227 11/28/2021       Lab Results   Component Value Date/Time    NA 130 11/28/2021 10:21 AM    K 3.5 11/28/2021 10:21 AM    CL 91 11/28/2021 10:21 AM    CO2 31 11/28/2021 10:21 AM    BUN 40 11/28/2021 10:21 AM    CREATININE 1.08 11/28/2021 10:21 AM    GLUCOSE 112 11/28/2021 10:21 AM    CALCIUM 9.1 11/28/2021 10:21 AM    LABGLOM 54  11/28/2021 10:21 AM        Wt Readings from Last 3 Encounters:   11/25/21 66.3 kg (146 lb 3.2 oz)   10/08/21 64.9 kg (143 lb)         Intake/Output Summary (Last 24 hours) at 11/28/2021 1314  Last data filed at 11/27/2021 1608  Gross per 24 hour   Intake --   Output 450 ml   Net -450 ml         Patient seen and examined. Chart reviewed. Labs, data and other pertinent notes reviewed in last 24 hrs.    PMH/SH/FH reviewed and unchanged compared to H&P    Discussed with patient/husband and RN      Jaydn Moscato,MD  [facility] ([place])

## 2021-11-29 LAB — COMPREHENSIVE METABOLIC PANEL
ALT: 78 U/L (ref 12–78)
AST: 71 U/L — ABNORMAL HIGH (ref 15–37)
Albumin/Globulin Ratio: 0.5 — ABNORMAL LOW (ref 1.1–2.2)
Albumin: 2 g/dL — ABNORMAL LOW (ref 3.5–5.0)
Alk Phosphatase: 395 U/L — ABNORMAL HIGH (ref 45–117)
Anion Gap: 7 mmol/L (ref 5–15)
BUN: 41 MG/DL — ABNORMAL HIGH (ref 6–20)
Bun/Cre Ratio: 44 — ABNORMAL HIGH (ref 12–20)
CO2: 31 mmol/L (ref 21–32)
Calcium: 9.1 MG/DL (ref 8.5–10.1)
Chloride: 92 mmol/L — ABNORMAL LOW (ref 97–108)
Creatinine: 0.94 MG/DL (ref 0.55–1.02)
Est, Glom Filt Rate: >60 mL/min/{1.73_m2} (ref >60)
Globulin: 3.9 g/dL (ref 2.0–4.0)
Glucose: 86 mg/dL (ref 65–100)
Potassium: 3.7 mmol/L (ref 3.5–5.1)
Sodium: 130 mmol/L — ABNORMAL LOW (ref 136–145)
Total Bilirubin: 0.7 MG/DL (ref 0.2–1.0)
Total Protein: 5.9 g/dL — ABNORMAL LOW (ref 6.4–8.2)

## 2021-11-29 LAB — CBC
Hematocrit: 33.5 % — ABNORMAL LOW (ref 35.0–47.0)
Hemoglobin: 10.9 g/dL — ABNORMAL LOW (ref 11.5–16.0)
MCH: 32.6 PG (ref 26.0–34.0)
MCHC: 32.5 g/dL (ref 30.0–36.5)
MCV: 100.3 FL — ABNORMAL HIGH (ref 80.0–99.0)
MPV: 10.8 FL (ref 8.9–12.9)
Nucleated RBCs: 0 PER 100 WBC
Platelets: 215 10*3/uL (ref 150–400)
RBC: 3.34 M/uL — ABNORMAL LOW (ref 3.80–5.20)
RDW: 17.2 % — ABNORMAL HIGH (ref 11.5–14.5)
WBC: 14.4 10*3/uL — ABNORMAL HIGH (ref 3.6–11.0)
nRBC: 0 10*3/uL (ref 0.00–0.01)

## 2021-11-29 LAB — PHOSPHORUS: Phosphorus: 2.2 MG/DL — ABNORMAL LOW (ref 2.6–4.7)

## 2021-11-29 LAB — MAGNESIUM: Magnesium: 1.8 mg/dL (ref 1.6–2.4)

## 2021-11-29 MED ORDER — VENLAFAXINE HCL 25 MG PO TABS
25 | Freq: Two times a day (BID) | ORAL | Status: DC
Start: 2021-11-29 — End: 2021-12-01
  Administered 2021-11-29 – 2021-12-01 (×4): 25 mg via ORAL

## 2021-11-29 MED ORDER — SENNA-DOCUSATE SODIUM 8.6-50 MG PO TABS
8.6-50 | Freq: Two times a day (BID) | ORAL | Status: DC
Start: 2021-11-29 — End: 2021-12-05
  Administered 2021-11-30 – 2021-12-05 (×7): 2 via ORAL

## 2021-11-29 MED ORDER — POTASSIUM & SODIUM PHOSPHATES 280-160-250 MG PO PACK
280-160-250 MG | Freq: Two times a day (BID) | ORAL | Status: AC
Start: 2021-11-29 — End: 2021-12-02
  Administered 2021-11-30 – 2021-12-01 (×3): 250 via ORAL

## 2021-11-29 MED ORDER — BISACODYL 10 MG RE SUPP
10 | Freq: Every day | RECTAL | Status: DC | PRN
Start: 2021-11-29 — End: 2021-12-05

## 2021-11-29 MED ORDER — POLYETHYLENE GLYCOL 3350 17 G PO PACK
17 g | Freq: Three times a day (TID) | ORAL | Status: AC
Start: 2021-11-29 — End: 2021-12-05
  Administered 2021-11-29 – 2021-12-05 (×9): 17 g via ORAL

## 2021-11-29 MED FILL — VENLAFAXINE HCL 25 MG PO TABS: 25 MG | ORAL | Qty: 1

## 2021-11-29 MED FILL — MIDODRINE HCL 5 MG PO TABS: 5 MG | ORAL | Qty: 1

## 2021-11-29 MED FILL — ACETAMINOPHEN 325 MG PO TABS: 325 MG | ORAL | Qty: 2

## 2021-11-29 MED FILL — MIRALAX 17 G PO PACK: 17 g | ORAL | Qty: 1

## 2021-11-29 MED FILL — METOCLOPRAMIDE HCL 5 MG/ML IJ SOLN: 5 MG/ML | INTRAMUSCULAR | Qty: 2

## 2021-11-29 MED FILL — PREDNISONE 10 MG PO TABS: 10 MG | ORAL | Qty: 1

## 2021-11-29 MED FILL — PANTOPRAZOLE SODIUM 40 MG IV SOLR: 40 MG | INTRAVENOUS | Qty: 40

## 2021-11-29 MED FILL — ELIQUIS 5 MG PO TABS: 5 MG | ORAL | Qty: 2

## 2021-11-29 MED FILL — MIRTAZAPINE 15 MG PO TABS: 15 MG | ORAL | Qty: 1

## 2021-11-29 NOTE — Plan of Care (Signed)
Problem: Skin/Tissue Integrity  Goal: Absence of new skin breakdown  Description: 1.  Monitor for areas of redness and/or skin breakdown  2.  Assess vascular access sites hourly  3.  Every 4-6 hours minimum:  Change oxygen saturation probe site  4.  Every 4-6 hours:  If on nasal continuous positive airway pressure, respiratory therapy assess nares and determine need for appliance change or resting period.  Outcome: Not Progressing     Problem: Discharge Planning  Goal: Discharge to home or other facility with appropriate resources  Outcome: Progressing     Problem: Safety - Adult  Goal: Free from fall injury  Outcome: Progressing     Problem: Pain  Goal: Verbalizes/displays adequate comfort level or baseline comfort level  Outcome: Progressing     Problem: ABCDS Injury Assessment  Goal: Absence of physical injury  Outcome: Progressing     Problem: Skin/Tissue Integrity  Goal: Absence of new skin breakdown  Description: 1.  Monitor for areas of redness and/or skin breakdown  2.  Assess vascular access sites hourly  3.  Every 4-6 hours minimum:  Change oxygen saturation probe site  4.  Every 4-6 hours:  If on nasal continuous positive airway pressure, respiratory therapy assess nares and determine need for appliance change or resting period.  Outcome: Not Progressing

## 2021-11-29 NOTE — Progress Notes (Signed)
Hospitalist Progress Note  Gwendlyn Deutscher, MD  Answering service: [phone]        Date of Service:  11/29/2021  NAME:  Ariel Day  DOB:  May 08, 1945  MRN:  [id number]      Admission Summary:     Patient presents with generalized weakness, confusion and severe hypercalcemia.    Interval history / Subjective:     Patient has no acute complaint, still poor appetite.     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  -Patient on capecitabine 500 mg BID one week on, one week off as outpatient, currently on hold  -Palliative care met with family on Friday, patient now DNR but not ready for hospice yet  -Oncology following    Abnormal UA  -Cystitis was ruled out, negative urine culture, blood cultures shows coag negative staph in 1 of 2 bottles likely contamination    Bilateral pleural effusion  -Patient with large bilateral pleural effusion on ultrasound done on 8/29  -S/p right thoracentesis on 9/1 with removal of 800 mL of pleural fluid  -Patient will also probably need left thoracentesis, will discuss with patient/family    Orthostatic hypotension  -Blood pressure medications held  -Continue midodrine    Hyponatremia  -Sodium stable at 130    Anemia of chronic disease  -H&H stable    Leukocytosis  -Likely due to steroid    DVT  -Venous Doppler shows extensive DVT of the left lower extremity  -Vascular surgery previously evaluated the patient, no surgical intervention was recommended  -Previously on heparin drip, now on anticoagulation with Eliquis    Anorexia  -This is likely due to her  metastatic cancer  -Continue supplements, continue Remeron    Generalized weakness  -Multifactorial including electrolyte imbalance, malignancy, poor p.o. intake, overall frailty  -PT to reevaluate today     Code status: DNR  Prophylaxis: Eliquis  Care Plan discussed with: Patient and husband present at bedside.  Anticipated Disposition: 24 to 48 hours.             Review of Systems:   Pertinent items are noted in HPI.         Vital Signs:    Last 24hrs VS reviewed since prior progress note. Most recent are:  Vitals:    11/29/21 0540   BP:    Pulse: (!) 108   Resp:    Temp:    SpO2:        No intake or output data in the 24 hours ending 11/29/21 0757     Physical Examination:     I had a face to face encounter with this patient and independently examined them on 11/29/2021 as outlined below:          General : alert x 3, awake, no acute distress,   HEENT: PEERL, EOMI, moist mucus membrane, TM clear  Neck: supple, no JVD, no meningeal signs  Chest: Clear to auscultation bilaterally   CVS: S1 S2 heard, Capillary refill less than 2 seconds  Abd: soft/ non tender, non distended, BS physiological,   Ext: no clubbing, no cyanosis, no edema, brisk 2+  DP pulses  Neuro/Psych: pleasant mood and affect, CN 2-12 grossly intact, sensory grossly within normal limit, Strength 5/5 in all extremities, DTR 1+ x 4  Skin: warm            Data Review:    Review and/or order of clinical lab test    I have independently reviewed and interpreted patient's lab and all other diagnostic data    Notes reviewed from all clinical/nonclinical/nursing services involved in patient's clinical care. Care coordination discussions were held with appropriate clinical/nonclinical/ nursing providers based on care coordination needs.     Labs:     Recent Labs     11/28/21  1021   WBC 13.9*   HGB 11.7   HCT 36.8   PLT 227     Recent Labs     11/28/21  1021   NA 130*   K 3.5   CL 91*   CO2 31   BUN 40*   MG 1.7   PHOS 1.6*     Recent Labs     11/28/21  1021    ALT 66   GLOB 3.6     No results for input(s): INR, APTT in the last 72 hours.    Invalid input(s): PTP   No results for input(s): TIBC, FERR in the last 72 hours.    Invalid input(s): FE, PSAT   No results found for: FOL, RBCF   No results for input(s): PH, PCO2, PO2 in the last 72 hours.  No results for input(s): CPK in the last 72 hours.    Invalid input(s): CPKMB, CKNDX, TROIQ  No results found for: CHOL, CHOLX, CHLST, CHOLV, HDL, HDLC, LDL, LDLC, TGLX, TRIGL  No results found for: GLUCPOC  '@LABUA' @      Medications Reviewed:     Current Facility-Administered Medications   Medication Dose Route Frequency    mirtazapine (REMERON) tablet 7.5 mg  7.5 mg Oral Nightly    prochlorperazine (COMPAZINE) suppository 25 mg  25 mg Rectal Q12H PRN    prochlorperazine (COMPAZINE) tablet 5 mg  5 mg Oral Q6H PRN    apixaban (ELIQUIS) tablet 10 mg  10 mg Oral BID    Followed by    Derrill Memo ON 11/30/2021] apixaban (ELIQUIS) tablet 5 mg  5 mg Oral BID    metoclopramide (REGLAN) injection 5 mg  5 mg IntraVENous TID    pantoprazole (PROTONIX) 40 mg in sodium chloride (PF) 0.9 % 10 mL injection  40 mg IntraVENous Q24H    midodrine (PROAMATINE) tablet 2.5 mg  2.5 mg Oral BID    sodium chloride flush 0.9 % injection 5-40 mL  5-40 mL IntraVENous 2 times per day    sodium chloride flush 0.9 % injection 5-40 mL  5-40 mL IntraVENous PRN    0.9 % sodium chloride infusion   IntraVENous PRN    ondansetron (ZOFRAN-ODT) disintegrating tablet 4 mg  4 mg Oral Q8H PRN    Or    ondansetron (ZOFRAN) injection 4 mg  4 mg IntraVENous Q6H PRN    polyethylene glycol (GLYCOLAX) packet 17 g  17 g Oral Daily PRN    acetaminophen (TYLENOL) tablet 650 mg  650 mg Oral Q6H PRN    Or    acetaminophen (TYLENOL) suppository 650 mg  650 mg Rectal Q6H PRN    predniSONE (DELTASONE) tablet 10 mg  10 mg Oral Daily     ______________________________________________________________________  EXPECTED LENGTH OF STAY: 5  ACTUAL LENGTH OF STAY:  [place], MD

## 2021-11-29 NOTE — Plan of Care (Signed)
Problem: Occupational Therapy - Adult  Goal: By Discharge: Performs self-care activities at highest level of function for planned discharge setting.  See evaluation for individualized goals.  Description: FUNCTIONAL STATUS PRIOR TO ADMISSION:  Patient unable to provide history at evaluation.  Per her husband, she has had a gradual progressive decline for about a year, increasing significantly for a few weeks prior to admission.  She has required assistance for all ADLs, although still contributing, and her mobility has declined from ambulating with RW to primarily using a wheelchair.    HOME SUPPORT: Patient resided with her husband to provide assistance.   They have been building an accessible addition to live at their daughter's house and have been staying in a motor home/ RV in the meantime.    Occupational Therapy Goals:  Initiated 11/22/2021, continued 11/29/21  1.  Patient will perform grooming with Minimal Assist within 7 day(s).  2.  Patient will perform self-feeding with Minimal Assist within 7 day(s).  3.  Patient will perform bathing with Moderate Assist within 7 day(s).  4.  Patient will perform toilet transfers with Minimal Assist  within 7 day(s).  5.  Patient will perform all aspects of toileting with Moderate Assist within 7 day(s).  6.  Patient will perform UB dressing with minimal assist within 7 days.  7.  Patient will perform LB dressing with moderate assist within 7 days.  8.  Patient will participate in upper extremity therapeutic exercise/activities with Supervision for 10 minutes within 7 day(s).      11/29/2021 1120 by Bailey Mech, OT  Outcome: Progressing    OCCUPATIONAL THERAPY TREATMENT: WEEKLY REASSESSMENT    Patient: Ariel Day (76 y.o. female)  Date: 11/29/2021  Primary Diagnosis: Hypercalcemia [E83.52]  Abnormal LFTs [R79.89]  Acute encephalopathy [W11.91]  Complicated UTI (urinary tract infection) [N39.0]  Carcinoma of breast metastatic to bone, unspecified laterality ([place])  [C50.919, C79.51]  AMS (altered mental status) [R41.82]       Precautions:                  Chart, occupational therapy assessment, plan of care, and goals were reviewed.    ASSESSMENT  Patient continues to benefit from skilled OT services and is slowly progressing towards goals.  Her command following, alertness, and functional participation are improving.  She remains limited by AMS, decreased insight into deficits, limited activity tolerance, general weakness, impaired balance, impaired coordination, impaired reach for ADLs, and limited endurance.  Today she progressed to require only min-modA for initial stand-pivot to chair and minA for seated toothbrushing.  BP decreased with upright activity (see vitals chart below, hx of POTs per dtr).  Although BP recovered with further seated activity, she was able to tolerate sitting in chair only ~10 min (even reclined), requesting to return to bed.  She required increased assistance for 2nd stand-pivot, maxA, after fatigue.  Continue to recommend SNF at d/c if goals of care are for rehabilitation.          PLAN  Goals have been updated based on progression since last assessment.  Patient continues to benefit from skilled intervention to address the above impairments.    Recommendations and Planned Interventions:   self care training, therapeutic activities, functional mobility training, balance training, therapeutic exercise, endurance activities, cognitive retraining, patient education, home safety training, and family training/education    Frequency/Duration: OT Plan of Care: 3 times/week    Recommendation for discharge: (in order for the patient to meet his/her long term goals):  Therapy up to 5 days/week in Skilled nursing facility         SUBJECTIVE:   Patient stated "I'm dizzy.  I need to lay back down."    OBJECTIVE DATA SUMMARY:     Functional Mobility and Transfers for ADLs:  Bed Mobility:  Bed Mobility Training  Sit to Supine: Maximum assistance      Transfers:   Transfer Training  Transfer Training: Yes  Sit to Stand: Moderate assistance;Maximum assistance  Stand to Sit: Moderate assistance;Maximum assistance  Stand Pivot Transfers: Minimum assistance;Maximum assistance  Bed to Chair: Maximum assistance;Moderate assistance    Balance:  Standing: Impaired  Balance  Sitting: Impaired  Sitting - Static: Fair (occasional)  Sitting - Dynamic: Poor (constant support)  Standing: Impaired  Standing - Static: Poor  Standing - Dynamic: Poor    ADL Intervention:          Grooming: Minimal assistance  Grooming Skilled Clinical Factors: brushed teeth seated in chair with minA to apply toothpaste and manage items            Pain Rating:  Patient did not report pain    Activity Tolerance:    11/29/21 1045 11/29/21 1050 11/29/21 1055   Vital Signs   Pulse (!) 133 (!) 127 (!) 126   Heart Rate Source Monitor Monitor Monitor   BP (!) 87/64 128/80 114/83   MAP (Calculated) 72 96 93   BP Location Left upper arm Left upper arm Left upper arm   BP Method Automatic Automatic Automatic   Patient Position Sitting  (after stand-pivot to chair) Supine  (reclined in chair) Sitting  (after seated activity upright)       After treatment:   Patient left in no apparent distress in bed, Call bell within reach, and Caregiver / family present    COMMUNICATION/EDUCATION:   The patient's plan of care was discussed with: physical therapist and registered nurse         Thank you for this referral.  Bailey Mech, OT  Minutes: 60

## 2021-11-29 NOTE — Progress Notes (Signed)
RENAL  PROGRESS NOTE        Subjective:   Feels ok,weak  Objective:   VITALS SIGNS:    BP 133/79   Pulse (!) 111   Temp 97.5 F (36.4 C) (Oral)   Resp 16   Ht 1.65 m (5' 4.96")   Wt 68.3 kg (150 lb 9.6 oz)   SpO2 94%   BMI 25.09 kg/m             Temp (24hrs), Avg:97.9 F (36.6 C), Min:97.3 F (36.3 C), Max:98.6 F (37 C)         PHYSICAL EXAM:  NAD    DATA REVIEW:     INTAKE / OUTPUT:   Last shift:      09/05 0701 - 09/05 1900  In: 150 [P.O.:150]  Out: 300 [Urine:300]  Last 3 shifts: No intake/output data recorded.    Intake/Output Summary (Last 24 hours) at 11/29/2021 0915  Last data filed at 11/29/2021 0729  Gross per 24 hour   Intake 150 ml   Output 300 ml   Net -150 ml         LABS:   Recent Labs     11/28/21  1021 11/29/21  0727   WBC 13.9* 14.4*   HGB 11.7 10.9*   HCT 36.8 33.5*   PLT 227 215     Recent Labs     11/28/21  1021 11/29/21  0727   NA 130* 130*   K 3.5 3.7   CL 91* 92*   CO2 31 31   BUN 40* 41*   MG 1.7 1.8   PHOS 1.6* 2.2*   ALT 66 78     Estimated Creatinine Clearance: 52 mL/min (based on SCr of 0.94 mg/dL).       Assessment:   Assessment:     AKI: Serum Cr 1.'3mg'$ /dl->> improved to 1.1-> 1.'0mg'$ /dl and holding steady. Suspected pre-renal state.      Hypercalcemia: Peak corrected Ca 13.4. Improving with Calcitonin/Zometa (8/28). Corrected Ca hovering in the mid 10s now.  Etiology likely 2 to metastatic Breast CA.   low Phos,hyponatremia  Combined Systolic/Diastolic CHF: +pulm edema/b/l pleural effusions on presentation     Metastatic Breast CA     Staph Bacteremia-> contaminant     Hypokalemia/Hypophosphatemia     Hyponatremia: mild/stable     Dysphagia        Plan/Recommendations:  Po phos   Overall prognosis guarded/poor-palliative care consulting. Now DNR

## 2021-11-29 NOTE — Progress Notes (Signed)
Comprehensive Nutrition Assessment    Type and Reason for Visit: Initial, RD Nutrition Re-Screen/LOS    Nutrition Recommendations/Plan:   SLP consult to assess for aspiration  Continue pureed diet for now  Ensure enlive TID, magic cup, yogurt, pudding daily  Continue remeron  Continue neutraphos BID       Malnutrition Assessment:  Malnutrition Status:  At risk for malnutrition (Comment) (poor appetite) (11/29/21 1448)           Nutrition Assessment:    PMHx: stage IV breast cancer with mets to bone and liver on oral chemotherapy, CHF s/p ICD, orthostatic hypotension, GERD, cancer related pain    76 y.o. female admitted with generalized weakness and confusion, worsening functional status 2/2 metastatic disease. Pt s/p R thoracentesis for large bilateral PE. Palliative following this admission; started on remeron for sleep and reglan for ongoing nausea. Oncology following. Family not ready for hospice. Visited with pt and husband at bedside; screened for LOS.  Pt was on regular diet this admission until she was downgraded to pureed yesterday. Has not been seen by SLP this admission. Pt was repeatedly coughing during my visit, stated, "Something is happening", and "I know it's going down into my lungs." Discussed general aspiration precautions, sitting upright for all po intake.  Pts appetite is very poor. Discussed that remeron also acts as appetite stimulant. Sometimes she finds the pureed trays appealing and sometimes not. When she eats she typically only eats a few bites. Ensure enlive ordered TID. Pt likes these and has been drinking typically 2 per day. Open to receive magic cup, likes fruity yogurt and chocolate pudding. All ordered.  We discussed eating as often as possible, every hour or so, and choosing foods/liquids that contain protein and calories. Any calorie is a good calorie.     Na 130, Phos 2.2    Nutritionally Significant Medications:  Reglan, midodrine, remeron, protonix, neutra phos BID,  prednisone      Estimated Daily Nutrient Needs:  Energy Requirements Based On: Kcal/kg  Weight Used for Energy Requirements: Usual  Energy (kcal/day): 1980 (30 kcal/kg)  Weight Used for Protein Requirements: Usual  Protein (g/day): 99 (1.5 g/kg)     Fluid (ml/day): 1 ml/kcal    Nutrition Related Findings:   Edema: No data recorded         Wounds:        Current Nutrition Therapies:  ADULT DIET; Dysphagia - Pureed  ADULT ORAL NUTRITION SUPPLEMENT; Breakfast, Lunch, Consulting civil engineer Protein Oral Supplement  ADULT ORAL NUTRITION SUPPLEMENT; Dinner; Frozen Oral Supplement  ADULT ORAL NUTRITION SUPPLEMENT; Lunch, Breakfast; Snack; fruity yogurt  ADULT ORAL NUTRITION SUPPLEMENT; Lunch, Dinner; Snack; chocolate pudding  Meal intake: Average Meal Intake: 1-25%  Supplement intake: Average Supplements Intake: 51-75%, 76-100%  Nutrition Support: none      Anthropometric Measures:  Height: 165.1 cm ('5\' 5"'$ )  Ideal Body Weight (IBW): 125 lbs (57 kg)       Current Body Weight: 146 lb 2.6 oz (66.3 kg), 116.9 % IBW. Weight Source: Bed Scale  Current BMI (kg/m2): 24.3  Usual Body Weight: 145 lb (65.8 kg)  % Weight Change (Calculated): 0.8  Weight Adjustment For: No Adjustment                 BMI Categories: Normal Weight (BMI 18.5-24.9)    Wt Readings from Last 10 Encounters:   11/29/21 68.3 kg (150 lb 9.6 oz)   10/08/21 64.9 kg (143 lb)     11/29/2021  150 lbs 10 oz 68.3 kg Bed scale   11/25/2021 146 lbs 3 oz 66.3 kg Bed scale   11/21/2021 138 lbs 14 oz 63 kg -   11/20/2021 140 lbs 6 oz 63.7 kg Bed scale   11/20/2021 157 lbs 3 oz 71.3 kg Bed scale   10/08/2021 143 lbs 64.9 kg Bed scale   10/08/2021 151 lbs 11 oz 68.8 kg Bed scale   10/07/2021 142 lbs 14 oz 64.8 kg Bed scale   10/07/2021 140 lbs 63.5 kg Stated   03/14/2021 150 lbs 68 kg          Nutrition Diagnosis:   Inadequate oral intake related to catabolic illness as evidenced by intake 0-25%    Nutrition Interventions:   Food and/or Nutrient Delivery: Continue Current  Diet, Modify Oral Nutrition Supplement, Snacks (Comment)  Nutrition Education/Counseling: Counseling completed  Coordination of Nutrition Care: Continue to monitor while inpatient       Goals:     Goals: other (specify)  Specify Other Goals: intake of ons >75%, intake of meals >50% over next 5-7 days    Nutrition Monitoring and Evaluation:   Behavioral-Environmental Outcomes: None Identified  Food/Nutrient Intake Outcomes: Food and Nutrient Intake, Supplement Intake  Physical Signs/Symptoms Outcomes: Biochemical Data, Meal Time Behavior, Weight    Discharge Planning:    Continue Oral Nutrition Supplement, Continue current diet     Recent Labs     11/28/21  1021 11/29/21  0727   GLUCOSE 112* 86   BUN 40* 41*   CREATININE 1.08* 0.94   NA 130* 130*   K 3.5 3.7   CL 91* 92*   CO2 31 31   CALCIUM 9.1 9.1   PHOS 1.6* 2.2*   MG 1.7 1.8       Recent Labs     11/28/21  1021 11/29/21  0727   ALT 66 78   AST 61* 71*   ALKPHOS 358* 395*   BILITOT 0.7 0.7   GLOB 3.6 3.9       No results for input(s): LACTA in the last 72 hours.    Recent Labs     11/28/21  1021 11/29/21  0727   WBC 13.9* 14.4*   HGB 11.7 10.9*   HCT 36.8 33.5*   PLT 227 215       No results for input(s): PREALB in the last 72 hours.  No results found for: PREALB    No results for input(s): TRIG in the last 72 hours.    No results found for: TRIG    No results for input(s): POCGLU in the last 72 hours.    No results found for: HBA1C, HBA1CPOC    Iron Profile    No results for input(s): IRON, FOL, VB6LT in the last 72 hours.    Invalid input(s): B12LT  Invalid input(s): NH4    No results found for: IRON, TIBC    No results found for: FERR    B Vitamins  No results found for: FOL, VB6LT    Zinc  No results found for: ZINCLT    Copper  No results found for: COSLT    Selenium  No components found for: SELNLT    Vit C  No components found for: [id number]    Fat Soluble Vitamins    No results found for: VITALT, VITD3, VITEG    No components found for:  NH4      Lyris Hitchman, RD  Available via PerfectServe

## 2021-11-29 NOTE — Progress Notes (Addendum)
[place] [facility]  [address] [place] [address]  W: [phone]  F: [phone]    Reason for Evaluation:   Ariel Day is a 76 y.o. 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      Hematology/Oncology Treatment History:   Follows at [facility] with Dr. Stephens November  ER+/PR+ lobular breast cancer diagnosed 12/20/2015 after presenting with multifocal bone mets  Current treatment: oral capecitabine 500 mg BID one week on, one week off.  Has been on hold since early August.      History of Present Illness:   Ariel Day is a pleasant 76 y.o. female who presents today for evaluation of metastatic breast cancer.      Patient admitted with complaints of generalized weakness, decreased PO intake, and confusion. Vitals on admission were stable.  Labs notable for WBC 20.3, left-shifted, Na 131, K 6.2, Cr 1.31, calcium 13.5, AST 112, alk phos 299, albumin 2.6.  CXR showed pulmonary edema with bilateral pleural effusions as well as lytic lesions in scapulae. CT head negative for acute abnormality.  UA with small blood and 1+ bacteria. She was treated with Lasix and empiric cefepime and admitted to medicine.    CT A/P noted large filling defect extending from L femoral vein to inferior portion of IVC, concerning for DVT; multiple hepatic mets; diffuse skeletal mets; and small volume ascites.  Doppler US with acute occlusive thrombus in L external iliac, common femoral, saphenofemoral junction, profunda femoral, and femoral. Subacute occlusive thrombus in the popliteal and posterior tibial veins. Limited visualization of the peroneal veins but show color filling; cannot exclude non-occlusive thrombus. No DVT in RLE.    At my evaluation, patient remains confused.  She is able to  answer some questions but not fully provide history.  Her husband states that they called their oncologist on Friday about the confusion and dehydration, and he advised trying Pedialyte/hydration, and if persisted to present to ER.  She has been off her capecitabine for ~2 weeks.    Interval History:  Patient more alert today.  Reports that she is having constipation with last BM 1 week ago.  Volunteers this information.    Husband at bedside.    Daughter [name] inquires about the role of Effexor in treating mood symptoms and possibly orthostasis.  Seen by PT. Required max assist to go from sit to stand but minimum assistance to complete transfer.  Orthostatic upon standing.     Social History:  Lives in a motor home.  Building a home in [place] was obtained and pertinent findings reviewed above. Past medical history, social history, family history, medications, and allergies are located in the electronic medical record.    Physical Exam:   BP 131/83   Pulse (!) 125   Temp 98.4 F (36.9 C) (Oral)   Resp 20   Ht '5\' 5"'  (1.651 m)   Wt 150 lb 9.6 oz (68.3 kg)   SpO2 98%   BMI 25.06 kg/m   ECOG PS: 3  General: somnolent   Respiratory: normal respiratory effort,  CV: tachycardic, no peripheral edema  Ext: warm, well perfused, calf asymmetry (L>R)  GI: soft, nontender, nondistended, no masses  Skin: no rashes, no ecchymoses, no petechiae    Results:  Lab Results   Component Value Date/Time    WBC 14.4 11/29/2021 07:27 AM    HGB 10.9 11/29/2021 07:27 AM    HCT 33.5 11/29/2021 07:27 AM    PLT 215 11/29/2021 07:27 AM    MCV 100.3 11/29/2021 07:27 AM     Lab Results   Component Value Date/Time    NA 130 11/29/2021 07:27 AM    K 3.7 11/29/2021 07:27 AM    CL 92 11/29/2021 07:27 AM    CO2 31 11/29/2021 07:27 AM    BUN 41 11/29/2021 07:27 AM     Lab Results   Component Value Date/Time    ALT 78 11/29/2021 07:27 AM    GLOB 3.9 11/29/2021 07:27 AM     Records from Epic reviewed and summarized  above.  Test results above have been reviewed.    Imaging:     CT Result (most recent):  CT ABDOMEN PELVIS W IV CONTRAST 11/20/2021    Narrative  EXAM: CT ABDOMEN PELVIS W IV CONTRAST    INDICATION: abd pain w/ AMS and N/V    COMPARISON: 10/07/2021    CONTRAST: 100 mL of Isovue-370.    ORAL CONTRAST: None    TECHNIQUE:  Following the intravenous administration of intravenous contrast, axial images  were obtained through the abdomen and pelvis. Coronal and sagittal  reconstructions were generated. CT dose reduction was achieved through use of a  standardized protocol tailored for this examination and automatic exposure  control for dose modulation.    FINDINGS:  There are moderate-sized bilateral pleural effusions with adjacent compressive  atelectasis.    A 2.4 cm cyst is noted in the right hepatic lobe.  There are multiple masses in  the liver are again visualized compatible with diffuse metastatic disease.  One  of the largest is located in the posterior right hepatic lobe measures up to 2.5  cm (series 3 image number 46).   The spleen enhances appropriately.   The  pancreas enhances homogeneously. No discrete masses.  The gallbladder is  surgically absent.  No significant biliary dilatation.    The adrenal glands appear normal.    The kidneys enhance symmetrically. No hydronephrosis.    Visualized portions of the bladder are grossly unremarkable.  Pelvic structures  are not well visualized because of metallic streak artifact from bilateral hip  arthritis hardware.    The uterus is present.    There is a small volume of ascites in the abdomen.  There is no free air.  There  are no abscesses.    No significant lymphadenopathy.    No evidence of intestinal obstruction.  The appendix appears normal.    Aorta is normal in caliber.  There is apparent extensive filling defect from the left femoral vein to the  very inferior aspect of the inferior vena cava compatible with large DVT.    Diffuse skeletal metastatic disease  is again noted.    Impression  1.  Large filling defect extending from the left femoral vein to the inferior  portion of the inferior vena cava concerning for deep venous thrombosis.    2.  Multiple hepatic masses are again noted compatible with hepatic metastatic  disease.    3.  Diffuse skeletal metastases are again visualized.    4.  Small volume of ascites.      The findings were conveyed to [facility]  via perfectserve at 11/20/2021 5:54 PM  by [facility].  914  Imaging personally reviewed by me    Assessment:   1) Metastatic lobular breast cancer (ER+/PR+)  Follows with Dr. Stephens November at [facility].  Sites of disease include diffuse bone mets, liver mets, and malignant pleural effusion. Currently on oral capecitabine 500 mg BID one week on, one week off.  It appears this was started in 2021 per his notes.  This has been on hold since early-mid August.    I revisited goal of care with patient, husband, and patient's duaghter (who joined via phone call).  Patient is the most alert that she has been during this hospitalization, but her performance status remains too poor to currently be a candidate for cancer-directed therapy.  I discussed that in setting of progressive cancer and clinical decline, it would be appropriate to consider transition to hospice.  Patient's family understand that her prognosis is guarded, especially in setting of hypercalcemia of malignancy, which is often a marker of advanced disease. However, they are not ready for hospice transition at this time.  They would like to proceed with rehab to determine if her ECOG can recover to where she would be a candidate for further cancer-directed therapy.  We discussed that there is a very high chance that she goes to rehab and does not improve to where we can safely consider chemotherapy, in which case, my recommendation at that point would continue to be hospice.  They have good insight and fully recognize this as a likely  possibility.  I will plan to see her in the outpatient setting to continue goals of care.  If she does clinically improve, then we would discuss next line of treatment at that time (need to obtain molecular markers from prior oncologist's office, Dr. Elinor Parkinson).      - Goals of care ongoing.  For now, will proceed with discharge to SNF and close outpatient follow up.  Patient would like to transfer care to me to be closer to her family/daughter.     #) Hypercalcemia of malignancy, improving:  Presented with calcium 12.3, corrected 13.5  - Status post calcitonin 4 units/kg x 4 doses and Zometa x 1 dose.  Calcium improving, now down to corrected ~mid 10s.     #) Metabolic encephalopathy: due to hypercalcemia and UTI.  Improving    #) Constipation: due to hypercalcemia  - Bowel regimen with miralax TID, seena 2 tabs BID, and prn dulcolax suppository    #) Extensive acute left proximal and distal vein DVT:  Incidentally noted on CT A/P with filling defect from L femoral vein extending to IVC.  Follow up Doppler with extensive DVT throughout L external ilac to femoral vein and subacute occlusive thrombus in popliteal and posterior tibial veins.  Risk factors: a) metastatic breast cancer  - Continue Eliquis, can transition to 5 mg BID once load complete; recommend lifelong AC    #) Mood symptoms: trial of low-dose Effexor; can cause blood pressure elevation, which may improve patient's orthostasis symptoms    #) Acute on chronic heart failure: Lasix per medicine    #) Acute cystitis: antibiotics per medicine    #) Orthostatic hypotension: midodrine per medicine; consider increasing to TID    #) Adrenal insufficiency: on chronic prednisone 10 mg daily    #) Staph in 1 of 2 blood cultures: likely contaminant    Oncology is available as needed. Thank you for involving me in the care of this patient.  Patient can follow up with me as outpatient.  50 mins of time was spent with the patient, including reviewing chart, labs,  imaging and discussing with consultants.  Greater than 50% of the time involved face-to-face counseling of the patient regarding the proposed treatment plan.        Signed By: Kristopher Oppenheim, MD

## 2021-11-29 NOTE — Plan of Care (Signed)
Problem: Skin/Tissue Integrity  Goal: Absence of new skin breakdown  Description: 1.  Monitor for areas of redness and/or skin breakdown  2.  Assess vascular access sites hourly  3.  Every 4-6 hours minimum:  Change oxygen saturation probe site  4.  Every 4-6 hours:  If on nasal continuous positive airway pressure, respiratory therapy assess nares and determine need for appliance change or resting period.  11/29/2021 0117 by Waldon Merl, RN  Outcome: Not Progressing     Problem: Discharge Planning  Goal: Discharge to home or other facility with appropriate resources  11/29/2021 1141 by Daphene Jaeger, RN  Outcome: Progressing  11/29/2021 0117 by Waldon Merl, RN  Outcome: Progressing     Problem: Safety - Adult  Goal: Free from fall injury  11/29/2021 1141 by Daphene Jaeger, RN  Outcome: Progressing  11/29/2021 0117 by Waldon Merl, RN  Outcome: Progressing     Problem: Pain  Goal: Verbalizes/displays adequate comfort level or baseline comfort level  11/29/2021 0117 by Waldon Merl, RN  Outcome: Progressing     Problem: Occupational Therapy - Adult  Goal: By Discharge: Performs self-care activities at highest level of function for planned discharge setting.  See evaluation for individualized goals.  Description: FUNCTIONAL STATUS PRIOR TO ADMISSION:  Patient unable to provide history at evaluation.  Per her husband, she has had a gradual progressive decline for about a year, increasing significantly for a few weeks prior to admission.  She has required assistance for all ADLs, although still contributing, and her mobility has declined from ambulating with RW to primarily using a wheelchair.    HOME SUPPORT: Patient resided with her husband to provide assistance.   They have been building an accessible addition to live at their daughter's house and have been staying in a motor home/ RV in the meantime.    Occupational Therapy Goals:  Initiated 11/22/2021, continued 11/29/21  1.   Patient will perform grooming with Minimal Assist within 7 day(s).  2.  Patient will perform self-feeding with Minimal Assist within 7 day(s).  3.  Patient will perform bathing with Moderate Assist within 7 day(s).  4.  Patient will perform toilet transfers with Minimal Assist  within 7 day(s).  5.  Patient will perform all aspects of toileting with Moderate Assist within 7 day(s).  6.  Patient will perform UB dressing with minimal assist within 7 days.  7.  Patient will perform LB dressing with moderate assist within 7 days.  8.  Patient will participate in upper extremity therapeutic exercise/activities with Supervision for 10 minutes within 7 day(s).      11/29/2021 1120 by Bailey Mech, OT  Outcome: Progressing  11/29/2021 1119 by Bailey Mech, OT  Outcome: Progressing     Problem: ABCDS Injury Assessment  Goal: Absence of physical injury  11/29/2021 0117 by Waldon Merl, RN  Outcome: Progressing     Problem: Skin/Tissue Integrity  Goal: Absence of new skin breakdown  Description: 1.  Monitor for areas of redness and/or skin breakdown  2.  Assess vascular access sites hourly  3.  Every 4-6 hours minimum:  Change oxygen saturation probe site  4.  Every 4-6 hours:  If on nasal continuous positive airway pressure, respiratory therapy assess nares and determine need for appliance change or resting period.  11/29/2021 0117 by Waldon Merl, RN  Outcome: Not Progressing

## 2021-11-29 NOTE — Plan of Care (Signed)
Problem: Physical Therapy - Adult  Goal: By Discharge: Performs mobility at highest level of function for planned discharge setting.  See evaluation for individualized goals.  Description: FUNCTIONAL STATUS PRIOR TO ADMISSION:  Patient unable to provide history at evaluation.  Per her husband, she has had a gradual progressive decline for about a year, increasing significantly for a few weeks prior to admission.  She has required assistance for all ADLs, although still contributing, and her mobility has declined from ambulating with RW to primarily using a wheelchair.    HOME SUPPORT: Patient lives with husband who provides assistance with all mobility and ADLs.   They have been building an accessible addition to live at their daughter's house and have been staying in a motor home/ RV on daughter's property in the meantime.    Physical Therapy Goals  Initiated 11/22/2021 goals reviewed on 11/29/2021 and remain appropriate  1.  Patient will move from supine to sit and sit to supine, scoot up and down, and roll side to side in bed with minA within 7 day(s).    2.  Patient will perform sit to stand with minA within 7 day(s).  3.  Patient will transfer from bed to chair and chair to bed with minA using the least restrictive device within 7 day(s).  4.  Patient will ambulate with moderate assistance for 15 feet with the least restrictive device within 7 day(s).     Outcome: Progressing       PHYSICAL THERAPY TREATMENT: WEEKLY REASSESSMENT    Patient: Ariel Day (76 y.o. female)  Date: 11/29/2021  Primary Diagnosis: Hypercalcemia [E83.52]  Abnormal LFTs [R79.89]  Acute encephalopathy [V42.59]  Complicated UTI (urinary tract infection) [N39.0]  Carcinoma of breast metastatic to bone, unspecified laterality ([place]) [C50.919, C79.51]  AMS (altered mental status) [R41.82]       Precautions: Fall Risk                    ASSESSMENT :  Patient continues to benefit from skilled PT services and is progressing towards goals. Pt  received semi fowler in bed, on RA, family in room, agreeable to work with therapy. Pt performed bed mobility, transferred EOB and sat ~ 5  mins requiring assist to maintain upright posture with brief moments of sitting independently. Pt then went sit>stand and transferred bed>chair via stand pivot. Pt required Max A to go sit>stand but Min A to complete transfer. Once sitting, pt with complaint of dizziness and BP with significant drop (see below). Starting pressure not recorded but 120's/70's. Pt quickly recovered with short period of being reclined in bedside chair. PT session concluded with OT still in room with family performing ADLs. Discussed discharge with pt and family. Pending further discussion with oncologist regarding prognosis and clarification of POC pt would benefit from SNF level rehab upon d/c.          11/29/21 1045 11/29/21 1050 11/29/21 1055   Vital Signs   Pulse (!) 133 (!) 127 (!) 126   Heart Rate Source Monitor Monitor Monitor   BP (!) 87/64 128/80 114/83   MAP (Calculated) 72 96 93   BP Location Left upper arm Left upper arm Left upper arm   BP Method Automatic Automatic Automatic   Patient Position Sitting  (after stand-pivot to chair) Supine  (reclined in chair) Sitting  (after seated activity upright)       Patient's progression toward goals since last assessment: progressing on all goals, slight decrease in [employer]  Functional Outcome Measure:  The patient scored 11/26 on the AMPAC outcome measure which is indicative of ?171,2,3 had higher odds of discharging home with home health or need of SNF/IPR.        PLAN :  Goals have been updated based on progression since last assessment.  Patient continues to benefit from skilled intervention to address the above impairments.    Recommendations and Planned Interventions:   bed mobility training, transfer training, gait training, therapeutic exercises, neuromuscular re-education, patient and family training/education, and therapeutic  activities    Frequency/Duration: Patient will be followed by physical therapy to address goals,   per day/week to address goals.    Recommendation for discharge: (in order for the patient to meet his/her long term goals): Therapy up to 5 days/week in Skilled nursing facility    Other factors to consider for discharge: high risk for falls, not safe to be alone, and concern for safely navigating or managing the home environment    IF patient discharges home will need the following DME: continuing to assess with progress     SUBJECTIVE:   Patient stated "thanks."    OBJECTIVE DATA SUMMARY:     Past Medical History:   Diagnosis Date    Breast cancer ([place])     Cardiomyopathy ([place])     EF10%; 65% 2022    Malignant pleural effusion     Orthostatic hypotension      Past Surgical History:   Procedure Laterality Date    CARDIAC DEFIBRILLATOR PLACEMENT Left 2012    CHOLECYSTECTOMY      JOINT REPLACEMENT Bilateral     MASTECTOMY, BILATERAL         Home Situation:  Social/Functional History  Lives With: Spouse (addition being completed soon, only other option is a motor home/ RV with steps to enter)  Type of Home: House  Home Layout: Able to Live on Main level with bedroom/bathroom  Home Access: Ramped entrance  Bathroom Equipment: 3-in-1 commode  Home Equipment: Oil Trough, [place] Help From: Family      Strength:    Strength: Generally decreased, functional      Range Of Motion:  AROM: Generally decreased, functional       Functional Mobility:  Bed Mobility:     Bed Mobility Training  Rolling: Moderate assistance  Supine to Sit: Moderate assistance  Sit to Supine: Maximum assistance  Transfers:     Transfer Training  Transfer Training: Yes  Sit to Stand: Moderate assistance;Maximum assistance  Stand to Sit: Moderate assistance;Maximum assistance  Stand Pivot Transfers: Minimum assistance;Maximum assistance  Bed to Chair: Maximum assistance;Moderate assistance  Balance:     Balance  Sitting: Impaired  Sitting  - Static: Fair (occasional)  Sitting - Dynamic: Poor (constant support)  Standing: Impaired  Standing - Static: Poor  Standing - Dynamic: Poor  [place] Mobility Inpatient Short Form (6-Clicks) Version 2    How much help is needed turning from your back to your side while in a flat bed without using bedrails?: A Lot  How much help is needed moving from lying on your back to sitting on the side of a flat bed without using bedrails?: A Lot  How much help is needed moving to and from a bed to a chair?: A Lot  How much help is needed standing up from a chair using your arms?: A Lot  How much help is needed walking in hospital room?: A Lot  How much help is needed climbing 3-5 steps with a railing?: Total    AM-PAC Inpatient Mobility Raw Score : 11  AM-PAC Inpatient T-Scale Score : 33.86     Cutoff score ?171,2,3 had higher odds of discharging home with home health or need of SNF/IPR.    [place], Jon Gills, Vinoth Debbe Bales, Mena Goes Passek, Roslynn Amble. Annabell Howells.  Validity of the AM-PAC "6-Clicks" Inpatient Daily Activity and Basic Mobility Short Forms. Physical Therapy Mar 2014, 94 (3) 379-391; DOI: 10.2522/ptj.20130199  2. Raynelle Chary. Association of AM-PAC "6-Clicks" Basic Mobility and Daily Activity Scores With Discharge Destination. Phys Ther. 2021 Apr 4;101(4):pzab043. doi: 10.1093/ptj/pzab043. PMID: 96283662.  [place], Rajaraman D, Tanna Furry, Agayby K, [place] S. Activity Measure for Post-Acute Care "6-Clicks" Basic Mobility Scores Predict Discharge Destination After Acute Care Hospitalization in Select Patient Groups: A Retrospective, Observational Study. Arch Rehabil Res Clin Transl. 2022  Jul 16;4(3):100204. doi: 10.1016/j.arrct.9476.546503. PMID: 54656812; PMCID: XNT7001749.  4. Vonzell Schlatter, Coster W, Ni P. AM-PAC Short Forms Manual 4.0. Revised 04/2018.                                                                                                                                                                                                                               Pain Rating:  Pt did not verbalize pain during session.    Activity Tolerance:   Fair  and SpO2 stable on room air    After treatment:   Patient left in no apparent distress sitting up in chair, Call bell within reach, and OT, student RN, and family in room    COMMUNICATION/EDUCATION:   The patient's plan of care was discussed with: occupational therapist, registered nurse,  and Advertising copywriter    Patient Education  Education Given To: Patient  Education Provided: Role of Therapy;Energy Conservation;Fall Prevention Strategies;Plan of Care;Transfer Training;Family Education  Education Method: Verbal  Education Outcome: Continued education needed    Thank you for this referral.  Erlene Quan, PT  Minutes: 25

## 2021-11-30 ENCOUNTER — Inpatient Hospital Stay: Admit: 2021-11-30 | Payer: MEDICARE | Primary: Family Medicine

## 2021-11-30 LAB — COMPREHENSIVE METABOLIC PANEL
ALT: 80 U/L — ABNORMAL HIGH (ref 12–78)
AST: 72 U/L — ABNORMAL HIGH (ref 15–37)
Albumin/Globulin Ratio: 0.6 — ABNORMAL LOW (ref 1.1–2.2)
Albumin: 2.2 g/dL — ABNORMAL LOW (ref 3.5–5.0)
Alk Phosphatase: 459 U/L — ABNORMAL HIGH (ref 45–117)
Anion Gap: 7 mmol/L (ref 5–15)
BUN: 41 MG/DL — ABNORMAL HIGH (ref 6–20)
Bun/Cre Ratio: 43 — ABNORMAL HIGH (ref 12–20)
CO2: 32 mmol/L (ref 21–32)
Calcium: 9.6 MG/DL (ref 8.5–10.1)
Chloride: 90 mmol/L — ABNORMAL LOW (ref 97–108)
Creatinine: 0.96 MG/DL (ref 0.55–1.02)
Est, Glom Filt Rate: >60 mL/min/{1.73_m2} (ref >60)
Globulin: 4 g/dL (ref 2.0–4.0)
Glucose: 93 mg/dL (ref 65–100)
Potassium: 4.2 mmol/L (ref 3.5–5.1)
Sodium: 129 mmol/L — ABNORMAL LOW (ref 136–145)
Total Bilirubin: 0.7 MG/DL (ref 0.2–1.0)
Total Protein: 6.2 g/dL — ABNORMAL LOW (ref 6.4–8.2)

## 2021-11-30 LAB — CBC WITH AUTO DIFFERENTIAL
Absolute Immature Granulocyte: 0.3 10*3/uL — ABNORMAL HIGH (ref 0.00–0.04)
Basophils %: 0 % (ref 0–1)
Basophils Absolute: 0.1 10*3/uL (ref 0.0–0.1)
Eosinophils %: 1 % (ref 0–7)
Eosinophils Absolute: 0.1 10*3/uL (ref 0.0–0.4)
Hematocrit: 37.3 % (ref 35.0–47.0)
Hemoglobin: 11.8 g/dL (ref 11.5–16.0)
Immature Granulocytes: 2 % — ABNORMAL HIGH (ref 0.0–0.5)
Lymphocytes %: 9 % — ABNORMAL LOW (ref 12–49)
Lymphocytes Absolute: 1.4 10*3/uL (ref 0.8–3.5)
MCH: 32.2 PG (ref 26.0–34.0)
MCHC: 31.6 g/dL (ref 30.0–36.5)
MCV: 101.9 FL — ABNORMAL HIGH (ref 80.0–99.0)
MPV: 10.6 FL (ref 8.9–12.9)
Monocytes %: 12 % (ref 5–13)
Monocytes Absolute: 2 10*3/uL — ABNORMAL HIGH (ref 0.0–1.0)
Neutrophils %: 76 % — ABNORMAL HIGH (ref 32–75)
Neutrophils Absolute: 12.3 10*3/uL — ABNORMAL HIGH (ref 1.8–8.0)
Nucleated RBCs: 0.1 PER 100 WBC — ABNORMAL HIGH
Platelets: 256 10*3/uL (ref 150–400)
RBC: 3.66 M/uL — ABNORMAL LOW (ref 3.80–5.20)
RDW: 17.2 % — ABNORMAL HIGH (ref 11.5–14.5)
WBC: 16.1 10*3/uL — ABNORMAL HIGH (ref 3.6–11.0)
nRBC: 0.02 10*3/uL — ABNORMAL HIGH (ref 0.00–0.01)

## 2021-11-30 LAB — MAGNESIUM: Magnesium: 1.9 mg/dL (ref 1.6–2.4)

## 2021-11-30 LAB — EXTRA TUBES HOLD

## 2021-11-30 LAB — PHOSPHORUS: Phosphorus: 2.1 MG/DL — ABNORMAL LOW (ref 2.6–4.7)

## 2021-11-30 MED FILL — PREDNISONE 10 MG PO TABS: 10 MG | ORAL | Qty: 1

## 2021-11-30 MED FILL — STIMULANT LAXATIVE 8.6-50 MG PO TABS: ORAL | Qty: 2

## 2021-11-30 MED FILL — MIRALAX 17 G PO PACK: 17 g | ORAL | Qty: 1

## 2021-11-30 MED FILL — VENLAFAXINE HCL 25 MG PO TABS: 25 MG | ORAL | Qty: 1

## 2021-11-30 MED FILL — ELIQUIS 5 MG PO TABS: 5 MG | ORAL | Qty: 1

## 2021-11-30 MED FILL — METOCLOPRAMIDE HCL 5 MG/ML IJ SOLN: 5 MG/ML | INTRAMUSCULAR | Qty: 2

## 2021-11-30 MED FILL — MIDODRINE HCL 5 MG PO TABS: 5 MG | ORAL | Qty: 1

## 2021-11-30 MED FILL — MIRTAZAPINE 15 MG PO TABS: 15 MG | ORAL | Qty: 1

## 2021-11-30 MED FILL — PHOS-NAK 280-160-250 MG PO PACK: 280-160-250 MG | ORAL | Qty: 1

## 2021-11-30 MED FILL — ELIQUIS 5 MG PO TABS: 5 MG | ORAL | Qty: 2

## 2021-11-30 MED FILL — PANTOPRAZOLE SODIUM 40 MG IV SOLR: 40 MG | INTRAVENOUS | Qty: 40

## 2021-11-30 NOTE — Plan of Care (Signed)
Problem: Discharge Planning  Goal: Discharge to home or other facility with appropriate resources  Outcome: Progressing     Problem: Safety - Adult  Goal: Free from fall injury  Outcome: Progressing     Problem: Pain  Goal: Verbalizes/displays adequate comfort level or baseline comfort level  Outcome: Progressing     Problem: Skin/Tissue Integrity  Goal: Absence of new skin breakdown  Description: 1.  Monitor for areas of redness and/or skin breakdown  2.  Assess vascular access sites hourly  3.  Every 4-6 hours minimum:  Change oxygen saturation probe site  4.  Every 4-6 hours:  If on nasal continuous positive airway pressure, respiratory therapy assess nares and determine need for appliance change or resting period.  Outcome: Progressing     Problem: Physical Therapy - Adult  Goal: By Discharge: Performs mobility at highest level of function for planned discharge setting.  See evaluation for individualized goals.  Description: FUNCTIONAL STATUS PRIOR TO ADMISSION:  Patient unable to provide history at evaluation.  Per her husband, she has had a gradual progressive decline for about a year, increasing significantly for a few weeks prior to admission.  She has required assistance for all ADLs, although still contributing, and her mobility has declined from ambulating with RW to primarily using a wheelchair.    HOME SUPPORT: Patient lives with husband who provides assistance with all mobility and ADLs.   They have been building an accessible addition to live at their daughter's house and have been staying in a motor home/ RV on daughter's property in the meantime.    Physical Therapy Goals  Initiated 11/22/2021 goals reviewed on 11/29/2021 and remain appropriate  1.  Patient will move from supine to sit and sit to supine, scoot up and down, and roll side to side in bed with minA within 7 day(s).    2.  Patient will perform sit to stand with minA within 7 day(s).  3.  Patient will transfer from bed to chair and chair  to bed with minA using the least restrictive device within 7 day(s).  4.  Patient will ambulate with moderate assistance for 15 feet with the least restrictive device within 7 day(s).     11/29/2021 1323 by Erlene Quan, PT  Outcome: Progressing

## 2021-11-30 NOTE — Progress Notes (Signed)
Patient was attempting to stand up with PT/OT when she went into vtach for approx. 200 beats. Patient laid back in bed and resumed her baseline sinus tach rhythm. MD notified. No new orders at this time.

## 2021-11-30 NOTE — Progress Notes (Signed)
No labs today;seen by oncology

## 2021-11-30 NOTE — Plan of Care (Signed)
Problem: Occupational Therapy - Adult  Goal: By Discharge: Performs self-care activities at highest level of function for planned discharge setting.  See evaluation for individualized goals.  Description: FUNCTIONAL STATUS PRIOR TO ADMISSION:  Patient unable to provide history at evaluation.  Per her husband, she has had a gradual progressive decline for about a year, increasing significantly for a few weeks prior to admission.  She has required assistance for all ADLs, although still contributing, and her mobility has declined from ambulating with RW to primarily using a wheelchair.    HOME SUPPORT: Patient resided with her husband to provide assistance.   They have been building an accessible addition to live at their daughter's house and have been staying in a motor home/ RV in the meantime.    Occupational Therapy Goals:  Initiated 11/22/2021, continued 11/29/21  1.  Patient will perform grooming with Minimal Assist within 7 day(s).  2.  Patient will perform self-feeding with Minimal Assist within 7 day(s).  3.  Patient will perform bathing with Moderate Assist within 7 day(s).  4.  Patient will perform toilet transfers with Minimal Assist  within 7 day(s).  5.  Patient will perform all aspects of toileting with Moderate Assist within 7 day(s).  6.  Patient will perform UB dressing with minimal assist within 7 days.  7.  Patient will perform LB dressing with moderate assist within 7 days.  8.  Patient will participate in upper extremity therapeutic exercise/activities with Supervision for 10 minutes within 7 day(s).      Outcome: Not Progressing     OCCUPATIONAL THERAPY TREATMENT  Patient: Ariel Day (77 y.o. female)  Date: 11/30/2021  Primary Diagnosis: Hypercalcemia [E83.52]  Abnormal LFTs [R79.89]  Acute encephalopathy [I43.32]  Complicated UTI (urinary tract infection) [N39.0]  Carcinoma of breast metastatic to bone, unspecified laterality ([place]) [C50.919, C79.51]  AMS (altered mental status) [R41.82]        Precautions: Fall Risk                Chart, occupational therapy assessment, plan of care, and goals were reviewed.    ASSESSMENT  Patient continues to benefit from skilled OT services and is not progressing towards goals.  Patient remains limited by AMS, poor insight, drowsiness, impaired balance, poor activity tolerance, generally decreased AROM/ strength/ coordination, and orthostatic hypotension (baseline).  Patient was agreeable to try mobilizing OOB to chair for lunch, although ultimately was unable.  Required modAx2 for supine-sit and minAx2 for sit-stand, but unable to side-step.  HR initially 120s-130s with activity, then with standing, HR jumped to 160s with ~200 beats of V-tach (per nurses, who responded immediately and helped patient return to supine).  She appeared relatively asymptomatic and endorsed only mild dizziness and tiredness.  Recommend SNF at d/c if goals of care remain for rehabilitation, although question patient's activity tolerance/ capacity for functional recovery.         PLAN :  Patient continues to benefit from skilled intervention to address the above impairments.  Continue treatment per established plan of care to address goals.    Recommendation for discharge: (in order for the patient to meet his/her long term goals): Recommend SNF at d/c if goals of care remain for rehabilitation, although question patient's activity tolerance/ capacity for functional recovery.         SUBJECTIVE:   Patient stated "I'm tired."    OBJECTIVE DATA SUMMARY:     Functional Mobility and Transfers for ADLs:  Bed Mobility:  Bed Mobility Training  Bed Mobility Training: Yes  Overall Level of Assistance: Assist X2;Moderate assistance  Rolling: Moderate assistance  Supine to Sit: Moderate assistance;Assist X2  Sit to Supine: Assist X2;Maximum assistance  Scooting: Total assistance       Balance:  Standing: Impaired  Balance  Sitting: Impaired  Sitting - Static: Fair (occasional)  Sitting - Dynamic: Poor  (constant support)  Standing: Impaired  Standing - Static: Constant support;Poor        Pain Rating:  Patient did not report pain      Activity Tolerance:   Poor   11/30/21 1210 11/30/21 1214 11/30/21 1217   Vital Signs   Pulse (!) 125 (!) 140 (!) 138   BP (!) 132/93 119/70  (mild dizziness) 134/89   MAP (Calculated) 106 86 104   BP Location Left upper arm Left upper arm Left upper arm   Patient Position Supine Sitting Sitting  (after seated activity)     Please refer to the flowsheet for vital signs taken during this treatment.    After treatment:   Patient left in no apparent distress in bed, Call bell within reach, and Caregiver / family present    COMMUNICATION/EDUCATION:   The patient's plan of care was discussed with: physical therapist and registered nurse         Thank you for this referral.  Bailey Mech, OT  Minutes: 23

## 2021-11-30 NOTE — Telephone Encounter (Signed)
Unable to leave vm advising pt of tentative consultation appt; vm full.

## 2021-11-30 NOTE — Care Coordination-Inpatient (Addendum)
Transition of Care Plan:    RUR: 18% Moderate  Prior Level of Functioning: Required some assistance with ADLs-living in camper with husband on daughter's property  Disposition: SNF  If SNF or IPR: Date FOC offered: 11/25/21  Date FOC received: Pending  Accepting facility:   Date authorization started with reference number:   Date authorization received and expires:   Follow up appointments: NA  DME needed: NA-owns wheelchair and walker  Transportation at discharge: BLS  IM/IMM Medicare/Tricare letter given: 11/23/21;  Is patient a Veteran and connected with VA? NA   If yes, was [employer] transfer form completed and VA notified?   Caregiver Contact: Husband Neshia Mckenzie, [phone]  Discharge Caregiver contacted prior to discharge? Yes, husband  Care Conference needed? NA  Barriers to discharge: SNF choice/auth needed    CM met with patient's husband to discuss SNF choices. Husband advised that their daughter [place] has the list and knows more about the area. CM placed follow up call to [facility], left voice message requesting call back. Patient will need [facility] for SNF. CM to monitor.     [place], [place]

## 2021-11-30 NOTE — Progress Notes (Signed)
Hospitalist Progress Note  Gwendlyn Deutscher, MD  Answering service: [phone]        Date of Service:  11/30/2021  NAME:  Ariel Day  DOB:  1945-09-13  MRN:  [id number]      Admission Summary:     Patient presents with generalized weakness, confusion and severe hypercalcemia.    Interval history / Subjective:     Patient has no acute complaint, still poor appetite.     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  -Patient on capecitabine 500 mg BID one week on, one week off as outpatient, currently on hold  -Palliative care met with family on Friday, patient now DNR but not ready for hospice yet  -Oncology following  -Plan is for patient to go to SNF and outpatient follow-up with oncology for rediscussion of whether patient will be a candidate for systemic chemotherapy    Abnormal UA  -Cystitis was ruled out, negative urine culture, blood cultures shows coag negative staph in 1 of 2 bottles likely contamination    Bilateral pleural effusion  -Patient with large bilateral pleural effusion on ultrasound done on 8/29  -S/p right thoracentesis on 9/1 with removal of 800 mL of pleural fluid  -Plan for left thoracentesis    Orthostatic hypotension  -Blood pressure medications held  -Continue midodrine    Hyponatremia  -Sodium stable    Anemia of chronic disease  -H&H stable    Leukocytosis  -Likely due to steroid    DVT  -Venous Doppler shows extensive DVT of the left lower extremity  -Vascular surgery previously evaluated the patient, no surgical intervention was recommended  -Previously on heparin  drip, now on anticoagulation with Eliquis    Anorexia  -This is likely due to her metastatic cancer  -Continue supplements, continue Remeron    Generalized weakness  -Multifactorial including electrolyte imbalance, malignancy, poor p.o. intake, overall frailty  -PT following, plan for placement to SNF     Code status: DNR  Prophylaxis: Eliquis  Care Plan discussed with: Patient and husband and daughter present at bedside.  Anticipated Disposition: Medically stable, placement pending.             Review of Systems:   Pertinent items are noted in HPI.         Vital Signs:    Last 24hrs VS reviewed since prior progress note. Most recent are:  Vitals:    11/30/21 1226   BP: (!) 144/93   Pulse: (!) 127   Resp:    Temp:    SpO2: 95%         Intake/Output Summary (Last 24 hours) at 11/30/2021 1416  Last data filed at 11/30/2021 7048  Gross per 24 hour   Intake 225 ml   Output 625 ml   Net -400 ml        Physical Examination:     I had a face to face encounter with this patient and independently examined them on 11/30/2021 as outlined below:          General : alert x 3, awake, no acute distress,   HEENT: PEERL, EOMI,  moist mucus membrane, TM clear  Neck: supple, no JVD, no meningeal signs  Chest: Clear to auscultation bilaterally   CVS: S1 S2 heard, Capillary refill less than 2 seconds  Abd: soft/ non tender, non distended, BS physiological,   Ext: no clubbing, no cyanosis, no edema, brisk 2+ DP pulses  Neuro/Psych: pleasant mood and affect, CN 2-12 grossly intact, sensory grossly within normal limit, Strength 5/5 in all extremities, DTR 1+ x 4  Skin: warm            Data Review:    Review and/or order of clinical lab test    I have independently reviewed and interpreted patient's lab and all other diagnostic data    Notes reviewed from all clinical/nonclinical/nursing services involved in patient's clinical care. Care coordination discussions were held with appropriate clinical/nonclinical/ nursing providers based on care  coordination needs.     Labs:     Recent Labs     11/29/21  0727 11/30/21  0633   WBC 14.4* 16.1*   HGB 10.9* 11.8   HCT 33.5* 37.3   PLT 215 256     Recent Labs     11/28/21  1021 11/29/21  0727 11/30/21  0633   NA 130* 130* 129*   K 3.5 3.7 4.2   CL 91* 92* 90*   CO2 31 31 32   BUN 40* 41* 41*   MG 1.7 1.8 1.9   PHOS 1.6* 2.2* 2.1*     Recent Labs     11/28/21  1021 11/29/21  0727 11/30/21  0633   ALT 66 78 80*   GLOB 3.6 3.9 4.0     No results for input(s): INR, APTT in the last 72 hours.    Invalid input(s): PTP   No results for input(s): TIBC, FERR in the last 72 hours.    Invalid input(s): FE, PSAT   No results found for: FOL, RBCF   No results for input(s): PH, PCO2, PO2 in the last 72 hours.  No results for input(s): CPK in the last 72 hours.    Invalid input(s): CPKMB, CKNDX, TROIQ  No results found for: CHOL, CHOLX, CHLST, CHOLV, HDL, HDLC, LDL, LDLC, TGLX, TRIGL  No results found for: GLUCPOC  '@LABUA' @      Medications Reviewed:     Current Facility-Administered Medications   Medication Dose Route Frequency    potassium & sodium phosphates (PHOS-NAK) 280-160-250 MG packet 250 mg  1 packet Oral BID with meals    polyethylene glycol (GLYCOLAX) packet 17 g  17 g Oral TID    sennosides-docusate sodium (SENOKOT-S) 8.6-50 MG tablet 2 tablet  2 tablet Oral BID    bisacodyl (DULCOLAX) suppository 10 mg  10 mg Rectal Daily PRN    venlafaxine (EFFEXOR) tablet 25 mg  25 mg Oral BID WC    mirtazapine (REMERON) tablet 7.5 mg  7.5 mg Oral Nightly    prochlorperazine (COMPAZINE) suppository 25 mg  25 mg Rectal Q12H PRN    prochlorperazine (COMPAZINE) tablet 5 mg  5 mg Oral Q6H PRN    apixaban (ELIQUIS) tablet 5 mg  5 mg Oral BID    metoclopramide (REGLAN) injection 5 mg  5 mg IntraVENous TID    pantoprazole (PROTONIX) 40 mg in sodium chloride (PF) 0.9 % 10 mL injection  40 mg IntraVENous Q24H    midodrine (PROAMATINE) tablet 2.5 mg  2.5 mg Oral BID    sodium chloride flush 0.9 % injection 5-40 mL  5-40 mL  IntraVENous 2  times per day    sodium chloride flush 0.9 % injection 5-40 mL  5-40 mL IntraVENous PRN    0.9 % sodium chloride infusion   IntraVENous PRN    ondansetron (ZOFRAN-ODT) disintegrating tablet 4 mg  4 mg Oral Q8H PRN    Or    ondansetron (ZOFRAN) injection 4 mg  4 mg IntraVENous Q6H PRN    polyethylene glycol (GLYCOLAX) packet 17 g  17 g Oral Daily PRN    acetaminophen (TYLENOL) tablet 650 mg  650 mg Oral Q6H PRN    Or    acetaminophen (TYLENOL) suppository 650 mg  650 mg Rectal Q6H PRN    predniSONE (DELTASONE) tablet 10 mg  10 mg Oral Daily     ______________________________________________________________________  EXPECTED LENGTH OF STAY: 5  ACTUAL LENGTH OF STAY:          10                 Gwendlyn Deutscher, MD

## 2021-11-30 NOTE — Consults (Signed)
Palliative Medicine Consult  [place]: [phone])    Patient Name: Ariel Day  Date of Birth: Oct 28, 1945    Date of Initial Consult: 11/21/2021  Date of Today's Visit: 11/30/2021  Reason for Consult: care decisions  Requesting Provider: Dr. Dairl Ponder      SUMMARY:   Ariel Day is a 76 y.o. with a past history of metastatic breast cancer (dx 2017 w bone mets), long hx of treatments followed by Dr. Stephens November at [facility] (current tx capecitabine), hx liver/ bone/ malignant pleural effusion [facility] 10/07/2021 temporary pleural drain), Hx chronic diastolic CHF s/p ICD 1017 EF 10% in past, improved to 65% in 2022 , adrenal insufficiency, orthostatic hypotension , who was admitted on 11/20/2021 from home with a diagnosis of weakness, fatigue, no longer able to walk, AMS.     Current medical issues leading to Palliative Medicine involvement include: patient being treated for hypercalcemia of malignancy (corrected calcium 13.5 on admission)  UTI and also acute LLE DVT extending into Inferior Vena Cava  CT Abd: 11/20/2021   1.  Large filling defect extending from the left femoral vein to the inferior  portion of the inferior vena cava concerning for deep venous thrombosis.  2.  Multiple hepatic masses are again noted compatible with hepatic metastatic  disease   3.  Diffuse skeletal metastases are again visualized  4.  Small volume of ascites.    ECHO 8/27 : EF 65-70%    Social: married to spouse Angellina Ferdinand for over 71 years.  They have 3 adult children (Dtr, [place], local, dtr [place], in [place] (family practice MD) and son, [name] in [place])  6 grandchildren in [place].   Recent move from [facility] to [place] to live near dtr. Currently in mobile home while inlaw suite under construction (ready this week they hope)  Patient and spouse have traveled extensively in the last 15 years, going to all 49 states in motor home.  They have spent many winters in [place], [place].  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  Spouse notes that patient has had ongoing functional decline in past 6 months, needing more assistance with ADL care       PALLIATIVE DIAGNOSES:   Metabolic encephalopathy- not much better.  More conversant but still confused even with calcium corrected.   Hypercalcemia of malignancy, breast cancer  Acute cystitis  LLE DVT extending into IVC  Pleural effusions, now s/p bilateral thoracentesis this admission   Diffuse skeletal metastatic disease  Ascites  Functional decline, weakness, cancer related fatigue  Poor appetite, weight loss.  Eating only bites/ sips   Palliative medicine encounter       PLAN:   Discussed with PT team.-- see their note.  Patient with ongoing profound weakness.  She had 200 beat run of Vtach today after standing up for a second  Spouse at bedside - He is aware of how weak she is, not able to make gains with therapy.  I express concern that perhaps therapy is going to not really be possible for her, especially in light of vtach today.   He is hopeful that she can make gains at SNF, but says he is realistic.   He also expresses some fear about his ability to provide her care at home if he did take her home with hospice.  (His daughter will be in the home but works during the day).    We have talked in prior conversations about the supports  that hospice provides.   Education provided about DDNR form. - he tells me his daughter is looking it over.   Explain that once she leaves the hospital, won't have protection of DNR unless they sign the form for her.     Our team will follow peripherally.  Please call if we can help [phone].  Family is clear on goal for trial of SNF.  I am concerned that she is high risk for ongoing decline very soon but family is not ready for hospice care.   ADVANCE CARE PLANNING / TREATMENT PREFERENCES:     GOALS OF CARE:  '[]'$ -Comfort   '[]'$ -Cure   '[]'$ -Prolong life   '[x]'$ -Recovery from acute illness   '[]'$ -Rehabilitation  '[x]'$ -Other:         TREATMENT PREFERENCES:      Patient and family's personal goals include: pt unable to state    Important upcoming milestones or family events: they have been adding to dtr's house, hoping to move in this week (currently in their mobile home in the driveway)     The patient identifies the following as important for living well: pt enjoys traveling, being on the go all the time, spending time with family    Advance Care Planning:  '[x]'$  The Pall Med Interdisciplinary Team has updated the ACP Navigator with [place] and Patient [place]     The patient has appointed the following active healthcare agents:      The Patient has the following current code status:    Code Status: DNR         Other:    As far as possible, the palliative care team has discussed with patient / health care proxy about goals of care / treatment preferences for patient.     HISTORY:     History obtained from: chart, spouse    CHIEF COMPLAINT: weakness, confusion    HPI/SUBJECTIVE:    The patient is:   '[]'$  Verbal and participatory  '[x]'$  Non-participatory due to: delirium    76 year old female with known breast cancer   Admitted with weakness, confusion   Decline in functional status       ROS / FUNCTIONAL ASSESSMENT:     Palliative Performance Scale (PPS)  PPS: 30         Modified-[place] Symptom Assessment Scale (ESAS)  Pain Score: No pain  Dyspnea Score: No shortness of breath    Clinical Pain Assessment  Severity: 0               PSYCHOSOCIAL/SPIRITUAL SCREENING:     Palliative IDT has assessed this patient for cultural preferences / practices and a referral made as appropriate to needs Orthoptist, Patient Advocacy, Ethics, etc.)    Spiritual affiliation was reviewed as documented by palliative care chaplain.      Any spiritual / religious / cultural beliefs and practices that will affect your patient's care?:  '[]'$  Yes /  '[x]'$  No   If "Yes" to discuss with pastoral care during IDT     Does caregiver feel burdened by  caring for their loved one:   '[]'$  Yes /  '[x]'$  No /  '[]'$  No Caregiver Present/Available '[]'$  No Caregiver '[]'$  Pt Lives at Facility  If "Yes" to discuss with social work during IDT    Anticipatory grief assessment:   '[x]'$  Normal  / '[]'$  Maladaptive     If "Maladaptive" to discuss with social work during IDT  PHYSICAL EXAM:     From RN flowsheet:  Wt Readings from Last 3 Encounters:   11/30/21 67.2 kg (148 lb 2.4 oz)   10/08/21 64.9 kg (143 lb)     Blood pressure 131/84, pulse (!) 127, temperature 97.5 F (36.4 C), temperature source Oral, resp. rate 20, height 1.651 m ('5\' 5"'$ ), weight 67.2 kg (148 lb 2.4 oz), SpO2 96 %.    Last bowel movement, if known:     Constitutional: pale, chronically ill appearing, sleeping NAD     HISTORY:     Principal Problem:    AMS (altered mental status)  Active Problems:    Acute encephalopathy    Carcinoma of breast metastatic to bone (HCC)    Complicated UTI (urinary tract infection)    Hypercalcemia    Acute deep vein thrombosis (DVT) of femoral vein of left lower extremity ([place])    Palliative care by specialist    Debility    Neoplastic malignant related fatigue    Confusion    Nausea    Encounter for hospice care discussion    Other constipation    Reactive depression    Orthostatic hypotension  Resolved Problems:    * No resolved hospital problems. *    Past Medical History:   Diagnosis Date    Breast cancer ([place])     Cardiomyopathy ([place])     EF10%; 65% 2022    Malignant pleural effusion     Orthostatic hypotension       Past Surgical History:   Procedure Laterality Date    CARDIAC DEFIBRILLATOR PLACEMENT Left 2012    CHOLECYSTECTOMY      JOINT REPLACEMENT Bilateral     MASTECTOMY, BILATERAL        Family History   Problem Relation Age of Onset    Heart Disease Mother     Breast Cancer Mother     Heart Disease Father       History reviewed, no pertinent family history.  Social History     Tobacco Use    Smoking status: Never    Smokeless tobacco: Not on file   Substance Use Topics     Alcohol use: Not Currently     No Known Allergies   Current Facility-Administered Medications   Medication Dose Route Frequency    potassium & sodium phosphates (PHOS-NAK) 280-160-250 MG packet 250 mg  1 packet Oral BID with meals    polyethylene glycol (GLYCOLAX) packet 17 g  17 g Oral TID    sennosides-docusate sodium (SENOKOT-S) 8.6-50 MG tablet 2 tablet  2 tablet Oral BID    bisacodyl (DULCOLAX) suppository 10 mg  10 mg Rectal Daily PRN    venlafaxine (EFFEXOR) tablet 25 mg  25 mg Oral BID WC    mirtazapine (REMERON) tablet 7.5 mg  7.5 mg Oral Nightly    prochlorperazine (COMPAZINE) suppository 25 mg  25 mg Rectal Q12H PRN    prochlorperazine (COMPAZINE) tablet 5 mg  5 mg Oral Q6H PRN    apixaban (ELIQUIS) tablet 5 mg  5 mg Oral BID    metoclopramide (REGLAN) injection 5 mg  5 mg IntraVENous TID    pantoprazole (PROTONIX) 40 mg in sodium chloride (PF) 0.9 % 10 mL injection  40 mg IntraVENous Q24H    midodrine (PROAMATINE) tablet 2.5 mg  2.5 mg Oral BID    sodium chloride flush 0.9 % injection 5-40 mL  5-40 mL IntraVENous 2 times per day    sodium chloride flush 0.9 %  injection 5-40 mL  5-40 mL IntraVENous PRN    0.9 % sodium chloride infusion   IntraVENous PRN    ondansetron (ZOFRAN-ODT) disintegrating tablet 4 mg  4 mg Oral Q8H PRN    Or    ondansetron (ZOFRAN) injection 4 mg  4 mg IntraVENous Q6H PRN    polyethylene glycol (GLYCOLAX) packet 17 g  17 g Oral Daily PRN    acetaminophen (TYLENOL) tablet 650 mg  650 mg Oral Q6H PRN    Or    acetaminophen (TYLENOL) suppository 650 mg  650 mg Rectal Q6H PRN    predniSONE (DELTASONE) tablet 10 mg  10 mg Oral Daily          LAB AND IMAGING FINDINGS:     Lab Results   Component Value Date/Time    WBC 16.1 11/30/2021 06:33 AM    HGB 11.8 11/30/2021 06:33 AM    PLT 256 11/30/2021 06:33 AM     Lab Results   Component Value Date/Time    NA 129 11/30/2021 06:33 AM    K 4.2 11/30/2021 06:33 AM    CL 90 11/30/2021 06:33 AM    CO2 32 11/30/2021 06:33 AM    BUN 41 11/30/2021  06:33 AM    MG 1.9 11/30/2021 06:33 AM    PHOS 2.1 11/30/2021 06:33 AM      Lab Results   Component Value Date/Time    GLOB 4.0 11/30/2021 06:33 AM     Lab Results   Component Value Date/Time    INR 1.2 11/20/2021 07:18 PM    APTT 25.1 11/20/2021 07:18 PM      No results found for: IRON, TIBC, IBCT, FERR   No results found for: PH, PCO2, PO2  No components found for: GLPOC   No results found for: CPK, CKMB, TROPONINI           Only check if applicable and billing time based rather than MDM    '[x]'$    The total encounter time on this service date was  25  minutes which was spent performing a face-to-face encounter and personally completing the provider-level activities documented in the note.  This includes time spent prior to the visit and after the visit in direct care of the patient.  This time does not include time spent in any separately reportable services.

## 2021-11-30 NOTE — Plan of Care (Signed)
Problem: Safety - Adult  Goal: Free from fall injury  Outcome: Progressing  Flowsheets (Taken 11/30/2021 1758)  Free From Fall Injury: Instruct family/caregiver on patient safety

## 2021-11-30 NOTE — Progress Notes (Signed)
Patient sustaining resting HR in the 140s-150s. NP on call notified. Orders for stat 12 lead EKG given.

## 2021-11-30 NOTE — Plan of Care (Addendum)
Problem: Physical Therapy - Adult  Goal: By Discharge: Performs mobility at highest level of function for planned discharge setting.  See evaluation for individualized goals.  Description: FUNCTIONAL STATUS PRIOR TO ADMISSION:  Patient unable to provide history at evaluation.  Per her husband, she has had a gradual progressive decline for about a year, increasing significantly for a few weeks prior to admission.  She has required assistance for all ADLs, although still contributing, and her mobility has declined from ambulating with RW to primarily using a wheelchair.    HOME SUPPORT: Patient lives with husband who provides assistance with all mobility and ADLs.   They have been building an accessible addition to live at their daughter's house and have been staying in a motor home/ RV on daughter's property in the meantime.    Physical Therapy Goals  Initiated 11/22/2021 goals reviewed on 11/29/2021 and remain appropriate  1.  Patient will move from supine to sit and sit to supine, scoot up and down, and roll side to side in bed with minA within 7 day(s).    2.  Patient will perform sit to stand with minA within 7 day(s).  3.  Patient will transfer from bed to chair and chair to bed with minA using the least restrictive device within 7 day(s).  4.  Patient will ambulate with moderate assistance for 15 feet with the least restrictive device within 7 day(s).     Outcome: Not Progressing     Problem: Physical Therapy - Adult  Goal: By Discharge: Performs mobility at highest level of function for planned discharge setting.  See evaluation for individualized goals.  Description: FUNCTIONAL STATUS PRIOR TO ADMISSION:  Patient unable to provide history at evaluation.  Per her husband, she has had a gradual progressive decline for about a year, increasing significantly for a few weeks prior to admission.  She has required assistance for all ADLs, although still contributing, and her mobility has declined from ambulating with  RW to primarily using a wheelchair.    HOME SUPPORT: Patient lives with husband who provides assistance with all mobility and ADLs.   They have been building an accessible addition to live at their daughter's house and have been staying in a motor home/ RV on daughter's property in the meantime.    Physical Therapy Goals  Initiated 11/22/2021 goals reviewed on 11/29/2021 and remain appropriate  1.  Patient will move from supine to sit and sit to supine, scoot up and down, and roll side to side in bed with minA within 7 day(s).    2.  Patient will perform sit to stand with minA within 7 day(s).  3.  Patient will transfer from bed to chair and chair to bed with minA using the least restrictive device within 7 day(s).  4.  Patient will ambulate with moderate assistance for 15 feet with the least restrictive device within 7 day(s).     Outcome: Not Progressing  PHYSICAL THERAPY TREATMENT    Patient: Ariel Day (76 y.o. female)  Date: 11/30/2021  Diagnosis: Hypercalcemia [E83.52]  Abnormal LFTs [R79.89]  Acute encephalopathy [J88.41]  Complicated UTI (urinary tract infection) [N39.0]  Carcinoma of breast metastatic to bone, unspecified laterality ([place]) [C50.919, C79.51]  AMS (altered mental status) [R41.82] AMS (altered mental status)      Precautions: Fall Risk                    ASSESSMENT:  Patient continues to benefit from skilled PT services and  is not progressing towards goals. Today the patient is seen in conjunction with OT as she requires assistance of 2 for safety.  She came to sitting EOB with moderate asist of 2 and required supervision as she had difficulty holding herself forward and upright.  After sitting for a few minutes she was agreeable to stand but she/we opted not to go to the chair as she is quite weak.  While sitting at the EOB she was not able to hold her head up and was in a forward flexed position.  With the bed raised and HHA of 2 the patient stood with minimal assist and was able to straighten  her knees and remain upright for about 5 seconds.  She sat back down to rest and nursing came suddenly into the room reporting the patient had gone into [place].  She did not pass out but was immediately returned to supine and scooted up in the bed where nursing attended to her.  She had an approximately 200 beat run of V tach before converting back to sinus tachycardia.  Considering her cardiac issues with standing, for future therapy I recommend only bed activities and sitting EOB.  It appears standing was too taxing for her at this time. She is profoundly weak and requires assist of 2 for safety.          PLAN:  Patient continues to benefit from skilled intervention to address the above impairments.  Continue treatment per established plan of care.    Recommendation for discharge: (in order for the patient to meet his/her long term goals): Therapy up to 5 days/week in Skilled nursing facility    Other factors to consider for discharge: no additional factors    IF patient discharges home will need the following DME: continuing to assess with progress       SUBJECTIVE:   Patient stated, "Not to the chair today."    OBJECTIVE DATA SUMMARY:   Critical Behavior:      Alert and oriented.      Functional Mobility Training:  Bed Mobility:  Bed Mobility Training  Bed Mobility Training: Yes  Overall Level of Assistance: Assist X2;Moderate assistance  Rolling: Moderate assistance  Supine to Sit: Moderate assistance;Assist X2  Sit to Supine: Assist X2;Maximum assistance  Scooting: Total assistance  Transfers:     Balance:  Balance  Sitting: Impaired  Sitting - Static: Fair (occasional)  Sitting - Dynamic: Poor (constant support)  Standing: Impaired  Standing - Static: Constant support;Poor   Ambulation/Gait Training:      Unable at this time.     Activity Tolerance:   Poor    After treatment:   Patient was left with nursing assisting her      COMMUNICATION/EDUCATION:   The patient's plan of care was discussed with:  occupational therapist and registered nurse    Patient Education  Education Given To: Patient  Education Provided: Role of Therapy;Home Exercise Program      Arman Filter, PT  Minutes: 25

## 2021-12-01 LAB — CBC WITH AUTO DIFFERENTIAL
Absolute Immature Granulocyte: 0.4 10*3/uL — ABNORMAL HIGH (ref 0.00–0.04)
Basophils %: 0 % (ref 0–1)
Basophils Absolute: 0.1 10*3/uL (ref 0.0–0.1)
Eosinophils %: 0 % (ref 0–7)
Eosinophils Absolute: 0 10*3/uL (ref 0.0–0.4)
Hematocrit: 44.8 % (ref 35.0–47.0)
Hemoglobin: 14 g/dL (ref 11.5–16.0)
Immature Granulocytes: 2 % — ABNORMAL HIGH (ref 0.0–0.5)
Lymphocytes %: 5 % — ABNORMAL LOW (ref 12–49)
Lymphocytes Absolute: 1.2 10*3/uL (ref 0.8–3.5)
MCH: 32.3 PG (ref 26.0–34.0)
MCHC: 31.3 g/dL (ref 30.0–36.5)
MCV: 103.5 FL — ABNORMAL HIGH (ref 80.0–99.0)
MPV: 10.6 FL (ref 8.9–12.9)
Monocytes %: 9 % (ref 5–13)
Monocytes Absolute: 2 10*3/uL — ABNORMAL HIGH (ref 0.0–1.0)
Neutrophils %: 84 % — ABNORMAL HIGH (ref 32–75)
Neutrophils Absolute: 19.8 10*3/uL — ABNORMAL HIGH (ref 1.8–8.0)
Nucleated RBCs: 0.2 PER 100 WBC — ABNORMAL HIGH
Platelets: 240 10*3/uL (ref 150–400)
RBC: 4.33 M/uL (ref 3.80–5.20)
RDW: 17.8 % — ABNORMAL HIGH (ref 11.5–14.5)
WBC: 23.4 10*3/uL — ABNORMAL HIGH (ref 3.6–11.0)
nRBC: 0.04 10*3/uL — ABNORMAL HIGH (ref 0.00–0.01)

## 2021-12-01 LAB — BASIC METABOLIC PANEL
Anion Gap: 13 mmol/L (ref 5–15)
Anion Gap: 6 mmol/L (ref 5–15)
BUN: 48 MG/DL — ABNORMAL HIGH (ref 6–20)
BUN: 54 MG/DL — ABNORMAL HIGH (ref 6–20)
Bun/Cre Ratio: 38 — ABNORMAL HIGH (ref 12–20)
Bun/Cre Ratio: 38 — ABNORMAL HIGH (ref 12–20)
CO2: 23 mmol/L (ref 21–32)
CO2: 30 mmol/L (ref 21–32)
Calcium: 9.5 MG/DL (ref 8.5–10.1)
Calcium: 9.7 MG/DL (ref 8.5–10.1)
Chloride: 91 mmol/L — ABNORMAL LOW (ref 97–108)
Chloride: 92 mmol/L — ABNORMAL LOW (ref 97–108)
Creatinine: 1.28 MG/DL — ABNORMAL HIGH (ref 0.55–1.02)
Creatinine: 1.42 MG/DL — ABNORMAL HIGH (ref 0.55–1.02)
Est, Glom Filt Rate: 44 mL/min/{1.73_m2} — ABNORMAL LOW (ref >60)
Est, Glom Filt Rate: 39 mL/min/{1.73_m2} — ABNORMAL LOW (ref >60)
Glucose: 148 mg/dL — ABNORMAL HIGH (ref 65–100)
Glucose: 168 mg/dL — ABNORMAL HIGH (ref 65–100)
Potassium: 5 mmol/L (ref 3.5–5.1)
Potassium: 4.4 mmol/L (ref 3.5–5.1)
Sodium: 127 mmol/L — ABNORMAL LOW (ref 136–145)
Sodium: 128 mmol/L — ABNORMAL LOW (ref 136–145)

## 2021-12-01 LAB — LACTIC ACID
Lactic Acid, Plasma: 3 MMOL/L (ref 0.4–2.0)
Lactic Acid, Plasma: 2 MMOL/L (ref 0.4–2.0)

## 2021-12-01 LAB — PROCALCITONIN: Procalcitonin: 1.05 ng/mL

## 2021-12-01 LAB — *Unknown: POC Occult Blood, Fecal: POSITIVE — AB

## 2021-12-01 MED ORDER — PANTOPRAZOLE SODIUM 40 MG PO TBEC
40 MG | Freq: Every day | ORAL | Status: AC
Start: 2021-12-01 — End: 2021-12-05
  Administered 2021-12-05: 10:00:00 40 mg via ORAL

## 2021-12-01 MED ORDER — CITALOPRAM HYDROBROMIDE 20 MG PO TABS
20 MG | Freq: Every day | ORAL | Status: AC
Start: 2021-12-01 — End: 2021-12-05
  Administered 2021-12-01 – 2021-12-05 (×4): 10 mg via ORAL

## 2021-12-01 MED ORDER — METOCLOPRAMIDE HCL 10 MG PO TABS
10 MG | Freq: Three times a day (TID) | ORAL | Status: AC
Start: 2021-12-01 — End: 2021-12-05
  Administered 2021-12-02 – 2021-12-05 (×3): 5 mg via ORAL

## 2021-12-01 MED FILL — MIRALAX 17 G PO PACK: 17 g | ORAL | Qty: 1

## 2021-12-01 MED FILL — STIMULANT LAXATIVE 8.6-50 MG PO TABS: ORAL | Qty: 2

## 2021-12-01 MED FILL — ELIQUIS 5 MG PO TABS: 5 MG | ORAL | Qty: 1

## 2021-12-01 MED FILL — METOCLOPRAMIDE HCL 10 MG PO TABS: 10 MG | ORAL | Qty: 1

## 2021-12-01 MED FILL — CITALOPRAM HYDROBROMIDE 20 MG PO TABS: 20 MG | ORAL | Qty: 1

## 2021-12-01 MED FILL — MIDODRINE HCL 5 MG PO TABS: 5 MG | ORAL | Qty: 1

## 2021-12-01 MED FILL — PHOS-NAK 280-160-250 MG PO PACK: 280-160-250 MG | ORAL | Qty: 1

## 2021-12-01 MED FILL — METOCLOPRAMIDE HCL 5 MG/ML IJ SOLN: 5 MG/ML | INTRAMUSCULAR | Qty: 2

## 2021-12-01 MED FILL — MIRTAZAPINE 15 MG PO TABS: 15 MG | ORAL | Qty: 1

## 2021-12-01 MED FILL — VENLAFAXINE HCL 25 MG PO TABS: 25 MG | ORAL | Qty: 1

## 2021-12-01 MED FILL — ACETAMINOPHEN 325 MG PO TABS: 325 MG | ORAL | Qty: 2

## 2021-12-01 MED FILL — PREDNISONE 10 MG PO TABS: 10 MG | ORAL | Qty: 1

## 2021-12-01 NOTE — Progress Notes (Signed)
Patient's rings removed due to finger swelling. Rings sent home with patient's daughter, Colletta Bolan.

## 2021-12-01 NOTE — Progress Notes (Signed)
[facility]    Progress Note    NAME: Nathifa Ritthaler   DOB:  November 19, 1945  MRM:  [id number]    Date/Time of service 12/01/2021  8:37 AM    To assist coordination of care and communication with nursing and staff, this note may be preliminary early in the day, but finalized by end of the day.        Assessment and Plan:     Acute metabolic encephalopathy - POA due to Ca and other terminal issues. Not better. End stage.  Palliative consulted.  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  Debility prevents any further treatment. Capecitabine on hold.     Acute deep vein thrombosis (DVT) of femoral vein of left lower extremity - POA, new, extensive.  Vascular surgery consulted but no intervention. Now on eliquis    Malignant pleural effusion - POA and she has twice had therapeutic thoracentesis in last week.    Hypercalcemia / Hyponatremia / AKI (acute kidney injury) / Dehydration / Elevated lactic acid level - Ca max 13.4 on admit. Due to mets. Got IV zometa.  Ca now better. Lactic acid in part due to liver mets.  Hydrating.  Na worsening    Debility / Neoplastic malignant related fatigue / Anorexia / Hypoalbuminemia - POA, severe, end stage.  She is not tolerating supplements. No reasonable hope of improvement. Hospice is a reasonable option      Orthostatic hypotension - Typically on midodrine      SIRS criteria / Leukocytosis / Sinus tachycardia - UA mostly normal and Ur cx negative. Blood cx with only CNSA contaminant. Steroids may have elevated WBC    Anxiety and depression - continue venlafaxine and remeron    GI bleed - Hemoccult positive on admit.  PPI         Subjective:     Chief Complaint:  nearly unresponsive    ROS:  (bold if positive, if negative)    Not Tolerating PT  Not tolerating Diet        Objective:     Last 24hrs VS reviewed since prior progress note. Most recent  are:    Vitals:    12/01/21 0148 12/01/21 0349 12/01/21 0500 12/01/21 0559   BP:   115/73    Pulse: (!) 132 (!) 128 (!) 125 (!) 122   Resp:   16    Temp:   98.2 F (36.8 C)    TempSrc:   Oral    SpO2:   95%    Weight:       Height:          '@lastspo2withflo'$ @     No intake or output data in the 24 hours ending 12/01/21 0837     Physical Exam:    Gen: Frail, ill appearing, in no acute distress  HEENT:  Pink conjunctivae, PERRL, hearing barely intact to voice, moist mucous membranes  Neck:  Supple, without masses, thyroid non-tender  Resp:  No accessory muscle use, clear breath sounds without wheezes rales or rhonchi  Card:  No murmurs, tachycardic S1, S2 without thrills, bruits or peripheral edema  Abd:  Soft, non-tender, non-distended, normoactive bowel sounds are present, no mass  Lymph:  No cervical or inguinal adenopathy  Musc:  No cyanosis or clubbing  Skin:  No rashes or ulcers, skin turgor is good  Neuro:  Cranial nerves are grossly intact, general motor weakness, does not follows commands appropriately  Psych:  Poor insight, not oriented    Telemetry reviewed:   normal sinus rhythm  __________________________________________________________________  Medications Reviewed: (see below)  Medications:     Current Facility-Administered Medications   Medication Dose Route Frequency    potassium & sodium phosphates (PHOS-NAK) 280-160-250 MG packet 250 mg  1 packet Oral BID with meals    polyethylene glycol (GLYCOLAX) packet 17 g  17 g Oral TID    sennosides-docusate sodium (SENOKOT-S) 8.6-50 MG tablet 2 tablet  2 tablet Oral BID    bisacodyl (DULCOLAX) suppository 10 mg  10 mg Rectal Daily PRN    venlafaxine (EFFEXOR) tablet 25 mg  25 mg Oral BID WC    mirtazapine (REMERON) tablet 7.5 mg  7.5 mg Oral Nightly    prochlorperazine (COMPAZINE) suppository 25 mg  25 mg Rectal Q12H PRN    prochlorperazine (COMPAZINE) tablet 5 mg  5 mg Oral Q6H PRN    apixaban (ELIQUIS) tablet 5 mg  5 mg Oral BID    metoclopramide (REGLAN)  injection 5 mg  5 mg IntraVENous TID    pantoprazole (PROTONIX) 40 mg in sodium chloride (PF) 0.9 % 10 mL injection  40 mg IntraVENous Q24H    midodrine (PROAMATINE) tablet 2.5 mg  2.5 mg Oral BID    sodium chloride flush 0.9 % injection 5-40 mL  5-40 mL IntraVENous 2 times per day    sodium chloride flush 0.9 % injection 5-40 mL  5-40 mL IntraVENous PRN    0.9 % sodium chloride infusion   IntraVENous PRN    ondansetron (ZOFRAN-ODT) disintegrating tablet 4 mg  4 mg Oral Q8H PRN    Or    ondansetron (ZOFRAN) injection 4 mg  4 mg IntraVENous Q6H PRN    polyethylene glycol (GLYCOLAX) packet 17 g  17 g Oral Daily PRN    acetaminophen (TYLENOL) tablet 650 mg  650 mg Oral Q6H PRN    Or    acetaminophen (TYLENOL) suppository 650 mg  650 mg Rectal Q6H PRN    predniSONE (DELTASONE) tablet 10 mg  10 mg Oral Daily        Lab Data Reviewed: (see below)  Lab Review:     Recent Labs     11/29/21  0727 11/30/21  0633 12/01/21  0500   WBC 14.4* 16.1* 23.4*   HGB 10.9* 11.8 14.0   HCT 33.5* 37.3 44.8   PLT 215 256 240     Recent Labs     11/28/21  1021 11/29/21  0727 11/30/21  0633 11/30/21  2306   NA 130* 130* 129* 127*   K 3.5 3.7 4.2 5.0   CL 91* 92* 90* 91*   CO2 31 31 32 23   GLUCOSE 112* 86 93 148*   BUN 40* 41* 41* 48*   CREATININE 1.08* 0.94 0.96 1.28*   CALCIUM 9.1 9.1 9.6 9.5   MG 1.7 1.8 1.9  --    PHOS 1.6* 2.2* 2.1*  --    LABALBU 2.2* 2.0* 2.2*  --    AST 61* 71* 72*  --    ALT 66 78 80*  --      Lab Results   Component Value Date/Time    GLUCOSE 148 11/30/2021 11:06 PM    GLUCOSE 93 11/30/2021 06:33 AM    GLUCOSE 86 11/29/2021 07:27 AM    GLUCOSE 112 11/28/2021 10:21 AM  GLUCOSE 95 11/26/2021 04:46 AM     No results for input(s): PH, PCO2, PO2, HCO3, FIO2 in the last 72 hours.  No results for input(s): INR in the last 72 hours.  Results       Procedure Component Value Units Date/Time    Culture, Blood 2 [[id number]] Collected: 12/01/21 0500    Order Status: Sent Specimen: Blood Updated: 12/01/21 0514    Culture,  Blood 1 [[id number]] Collected: 11/30/21 2303    Order Status: Completed Specimen: Blood Updated: 11/30/21 2355     Special Requests NO SPECIAL REQUESTS        Culture NO GROWTH <24 HRS       Culture, Urine [[id number]]     Order Status: Canceled Specimen: Urine, clean catch     Blood Culture 2 [[id number]]     Order Status: Canceled Specimen: Blood     Blood Culture 1 [[id number]]     Order Status: Canceled Specimen: Blood     Culture, Urine [[id number]] Collected: 11/20/21 1213    Order Status: Completed Specimen: Urine, clean catch Updated: 11/21/21 1137     Special Requests NO SPECIAL REQUESTS        Culture No growth (<1,000 CFU/ML)       Blood Culture 1 [[id number]]  (Abnormal) Collected: 11/20/21 1212    Order Status: Completed Specimen: Blood Updated: 11/22/21 1009     Special Requests NO SPECIAL REQUESTS        Culture       STAPHYLOCOCCUS SPECIES, COAGULASE NEGATIVE GROWING IN 1 OF 1 BOTTLES DRAWN SITE=LHAND            (NOTE) [facility] CALLED TO KEEFER RN AT 0945 8.28.23 RB    Blood Culture 2 [[id number]] Collected: 11/20/21 1212    Order Status: Completed Specimen: Blood Updated: 11/25/21 1512     Special Requests NO SPECIAL REQUESTS        Culture NO GROWTH 5 DAYS       Culture, Blood, PCR ID Panel [[id number]]  (Abnormal) Collected: 11/20/21 1212    Order Status: Completed Specimen: Blood Updated: 11/21/21 1055     Accession Number [id number]     Enterococcus faecalis by PCR Not detected        Enterococcus faecium by PCR Not detected        Listeria monocytogenes by PCR Not detected        STAPHYLOCOCCUS Detected        Staphylococcus Aureus Not detected        Staphylococcus epidermidis by PCR Not detected        Staphylococcus lugdunensis by PCR Not detected        STREPTOCOCCUS Not detected        Streptococcus agalactiae (Group B) Not detected        Strep pneumoniae Not detected        Strep pyogenes,(Grp. A) Not detected        Acinetobacter calcoac baumannii complex by PCR Not detected         Bacteroides fragilis by PCR Not detected        Enterobacteriaceae by PCR Not detected        Enterobacter cloacae complex by PCR Not detected        Escherichia Coli Not detected        Klebsiella aerogenes by PCR Not detected        Klebsiella oxytoca by PCR Not detected        Klebsiella pneumoniae group by  PCR Not detected        Proteus by PCR Not detected        Salmonella species by PCR Not detected        Serratia marcescens by PCR Not detected        Haemophilus Influenzae by PCR Not detected        Neisseria meningitidis by PCR Not detected        Pseudomonas aeruginosa Not detected        Stenotrophomonas maltophilia by PCR Not detected        Candida albicans by PCR Not detected        Candida auris by PCR Not detected        Candida glabrata Not detected        Candida krusei by PCR Not detected        Candida parapsilosis by PCR Not detected        Candida tropicalis by PCR Not detected        Cryptococcus neoformans/gattii by PCR Not detected        Resistant gene targets          [employer] test comment       False positive results may rarely occur. Correlate with clinical,epidemiologic, and other laboratory findings           Comment: Please see BCID Interpretation Guide in EPIC Links               Other pertinent lab: none    Total time spent with patient: 30 Minutes I personally reviewed chart, notes, data and current medications in the medical record.  I have personally examined and treated the patient at bedside during this period.                  Care Plan discussed with: Patient, Family, Care Manager, Nursing Staff, Consultant/Specialist, and >50% of time spent in counseling and coordination of care    Discussed:  Care Plan and D/C Planning    Prophylaxis:  H2B/PPI and Elquis    Disposition:   hospice           ___________________________________________________    Attending Physician: Gara Kroner, MD

## 2021-12-01 NOTE — Progress Notes (Signed)
[facility]  PROGRESS NOTE        Subjective:   Feels ok, tachycardia  Objective:   VITALS SIGNS:    BP 115/81   Pulse (!) 129   Temp 97.5 F (36.4 C) (Oral)   Resp 16   Ht 1.651 m ('5\' 5"'$ )   Wt 67.2 kg (148 lb 2.4 oz)   SpO2 95%   BMI 24.65 kg/m             Temp (24hrs), Avg:97.8 F (36.6 C), Min:97.5 F (36.4 C), Max:98.4 F (36.9 C)         PHYSICAL EXAM:  NAD    DATA REVIEW:     INTAKE / OUTPUT:   Last shift:      09/07 0701 - 09/07 1900  In: 100 [P.O.:100]  Out: 150 [Urine:150]  Last 3 shifts: 09/05 1901 - 09/07 0700  In: 275 [P.O.:275]  Out: 925 [Urine:925]    Intake/Output Summary (Last 24 hours) at 12/01/2021 0922  Last data filed at 12/01/2021 0729  Gross per 24 hour   Intake 150 ml   Output 450 ml   Net -300 ml           LABS:   Recent Labs     11/29/21  0727 11/30/21  0633 12/01/21  0500   WBC 14.4* 16.1* 23.4*   HGB 10.9* 11.8 14.0   HCT 33.5* 37.3 44.8   PLT 215 256 240       Recent Labs     11/28/21  1021 11/29/21  0727 11/30/21  0633 11/30/21  2306   NA 130* 130* 129* 127*   K 3.5 3.7 4.2 5.0   CL 91* 92* 90* 91*   CO2 31 31 32 23   BUN 40* 41* 41* 48*   MG 1.7 1.8 1.9  --    PHOS 1.6* 2.2* 2.1*  --    ALT 66 78 80*  --        Estimated Creatinine Clearance: 35 mL/min (A) (based on SCr of 1.28 mg/dL (H)).       Assessment:   Assessment:     AKI: Serum Cr 1.'3mg'$ /dl->> improved to 1.1-> 1.'0mg'$ /dl and holding steady. Suspected pre-renal state.    hyponatremia  Hypercalcemia: Peak corrected Ca 13.4. Improving with Calcitonin/Zometa (8/28). Corrected Ca hovering in the mid 10s now.  Etiology likely 2 to metastatic Breast CA.   low Phos,hyponatremia  Combined Systolic/Diastolic CHF: +pulm edema/b/l pleural effusions on presentation     Metastatic Breast CA     Staph Bacteremia-> contaminant     Hypokalemia/Hypophosphatemia        Dysphagia        Plan/Recommendations:  Po phos   Overall prognosis guarded/poor-palliative care consulting. Now DNR  Recheck BMP today  PRN loop diuretics  Beverly Gust, MD

## 2021-12-01 NOTE — Plan of Care (Signed)
Problem: Safety - Adult  Goal: Free from fall injury  Outcome: Progressing  Flowsheets (Taken 12/01/2021 1752)  Free From Fall Injury: Instruct family/caregiver on patient safety

## 2021-12-01 NOTE — Progress Notes (Signed)
Clinical Pharmacy Note: IV to PO Automatic Conversion  Please note: Ariel Day's medications (pantoprazole, metoclopramide) have been changed from IV to PO based on the following critiera:    Patient is tolerating oral medications  Patient is tolerating a diet more advanced than clear liquids  Patient is not requiring vasopressors    This IV to PO conversion is based on the [employer] approved automatic conversion policy for eligible patients.  Please call with questions.

## 2021-12-02 MED ORDER — MIRTAZAPINE 7.5 MG PO TABS
7.5 MG | ORAL_TABLET | Freq: Every evening | ORAL | 0 refills | Status: AC
Start: 2021-12-02 — End: 2022-01-01

## 2021-12-02 MED ORDER — APIXABAN 5 MG PO TABS
5 MG | ORAL_TABLET | Freq: Two times a day (BID) | ORAL | 0 refills | Status: AC
Start: 2021-12-02 — End: 2022-01-01

## 2021-12-02 MED ORDER — CITALOPRAM HYDROBROMIDE 10 MG PO TABS
10 MG | ORAL_TABLET | Freq: Every day | ORAL | 0 refills | Status: AC
Start: 2021-12-02 — End: ?
  Filled 2021-12-05: qty 15, 15d supply, fill #0

## 2021-12-02 MED FILL — METOCLOPRAMIDE HCL 10 MG PO TABS: 10 MG | ORAL | Qty: 1

## 2021-12-02 MED FILL — MIRALAX 17 G PO PACK: 17 g | ORAL | Qty: 1

## 2021-12-02 MED FILL — STIMULANT LAXATIVE 8.6-50 MG PO TABS: ORAL | Qty: 2

## 2021-12-02 MED FILL — ELIQUIS 5 MG PO TABS: 5 MG | ORAL | Qty: 1

## 2021-12-02 MED FILL — PREDNISONE 10 MG PO TABS: 10 MG | ORAL | Qty: 1

## 2021-12-02 MED FILL — PHOS-NAK 280-160-250 MG PO PACK: 280-160-250 MG | ORAL | Qty: 1

## 2021-12-02 MED FILL — MIRTAZAPINE 15 MG PO TABS: 15 MG | ORAL | Qty: 1

## 2021-12-02 MED FILL — CITALOPRAM HYDROBROMIDE 20 MG PO TABS: 20 MG | ORAL | Qty: 1

## 2021-12-02 MED FILL — MIDODRINE HCL 5 MG PO TABS: 5 MG | ORAL | Qty: 1

## 2021-12-02 MED FILL — PANTOPRAZOLE SODIUM 40 MG PO TBEC: 40 MG | ORAL | Qty: 1

## 2021-12-02 NOTE — Progress Notes (Signed)
[facility]  [phone]     Patient Name: Ariel Day  Date of Birth: 12/10/1945  Age: 76 y.o.    Raymond RN Note:  Hospice consult received, reviewing chart. Will follow up with Unit Nurse and Care Manager to discuss plan of care, patient status and discharge disposition within the hour.     Thank you for the opportunity to be of service to this patient.

## 2021-12-02 NOTE — Discharge Instructions (Addendum)
Patient Discharge Instructions    Ariel Day / [id number] DOB: 06/04/1945    Admitted 11/20/2021 Discharged: 12/04/2021     Primary Diagnoses  '@Rprob'$ @    Take Home Medications     It is important that you take the medication exactly as they are prescribed.   Keep your medication in the bottles provided by the pharmacist and keep a list of the medication names, dosages, and times to be taken in your wallet.   Do not take other medications without consulting your doctor.       What to do at Home    Recommended diet: regular diet    Recommended activity: activity as tolerated    If you experience worse symptoms, please follow up with hospice.    Follow-up with your PCP in a few weeks    When pleural effusion comes back, IR recommends family call [phone] when re accumulated and they will place pleurex outpatient       Hospice: Care Instructions  Overview  Hospice care provides medical treatment to relieve symptoms at the end of life. The goal is to keep you comfortable, not to try to cure you. Hospice care does not speed up or lengthen dying. It focuses on easing pain and other symptoms. Hospice caregivers want to enhance your quality of life.  Hospice care also offers emotional help and spiritual support when you are dying. It also helps family members care for someone who is dying.  Hospice care can help you review your life, say important things to family and friends, and explore spiritual issues. Hospice also helps your family and friends grieve.  You can use hospice care if your illness cannot be cured and doctors believe you have no more than 6 months to live. You do not need to be confined to a bed or in a hospital to benefit from this type of care.  The hospice team includes doctors, nurses, counselors, therapists, social workers, pastors, home health aides, and trained volunteers. You can get care in your own home or in a hospice center. Some hospice workers also go to nursing homes or hospitals.  How can  you care for yourself at home?  Prepare a list of advance directives. These are instructions to your doctor and family members about what kind of care you want if you become unable to speak or express yourself.  Find out if your health insurance covers hospice care.  Find hospice programs in your area. People who can help include your doctor, your state health department, and your insurance company.  Decide what kinds of hospice services you want. It helps to know what you want before you enter a hospice program.  Think about some questions when preparing for hospice care.  Who do you want to make decisions about your medical care if you are not able to? Many people choose their partner, child, or friend.  What are you most afraid of that might happen? You might be afraid of having pain or losing your independence. Let your hospice team know your fears. The team can help you.  Where would you prefer to die? Choices include your home, a hospital, or a nursing home.  Do you want to donate organs when you die? Make sure that your family clearly understands this.  Do you want any religious rites or practices to be done before you die? Let your hospice team know what you want.  Where can you learn more?  Go to https://www.bennett.info/ and enter  N994 to learn more about "Hospice: Care Instructions."  Current as of: June 19, 2021               Content Version: 13.8   2006-2023 [employer].   Care instructions adapted under license by [facility]. If you have questions about a medical condition or this instruction, always ask your healthcare professional. [facility] any warranty or liability for your use of this information.        '@FOLLOWUPSECTION'$ @     Information obtained by :  I understand that if any problems occur once I am at home I am to contact my physician.    I understand and acknowledge receipt of the instructions indicated above.                                                                                                                                            Physician's or R.N.'s Signature                                                                  Date/Time                                                                                                                                              Patient or Garment/textile technologist                                                          Date/Time

## 2021-12-02 NOTE — Progress Notes (Signed)
[facility]    Progress Note    NAME: Ariel Day   DOB:  May 19, 1945  MRM:  [id number]    Date/Time of service 12/02/2021  7:52 AM    To assist coordination of care and communication with nursing and staff, this note may be preliminary early in the day, but finalized by end of the day.        Assessment and Plan:     Acute metabolic encephalopathy - POA due to Ca and other terminal issues. Now stable but poor.  Family asks to stop venlafaxine.  End stage.  Palliative consulted.  Hospice is a reasonable option, and the family has agreed to this.  Hospice info session set up. Case management to assist.    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  Debility prevents any further treatment. Capecitabine on hold. Patient to enter hospice.    Acute deep vein thrombosis (DVT) of femoral vein of left lower extremity - POA, new, extensive.  Vascular surgery consulted but no intervention. Now on eliquis, for comfort    Malignant pleural effusion - POA and she has twice had therapeutic thoracentesis in last week.  Would like pleurex prior to discharge, for hospice palliative care.    Hypercalcemia / Hyponatremia / AKI (acute kidney injury) / Dehydration / Elevated lactic acid level - Ca max 13.4 on admit. Due to mets. Got IV zometa.  Ca now better. Lactic acid in part due to liver mets.  Hydrating.  Na worsening. On hospice, stop checking    Debility / Neoplastic malignant related fatigue / Anorexia / Hypoalbuminemia - POA, severe, end stage.  She is not tolerating supplements. No reasonable hope of physical improvement. Hospice is a reasonable option      Orthostatic hypotension - Typically on midodrine, but not an issue on hospice      SIRS criteria / Leukocytosis / Sinus tachycardia - UA mostly normal and Ur cx negative. Blood cx with only CNSA contaminant. Steroids may have elevated WBC    Anxiety and depression - stop  venlafaxine, start celexa and continue remeron    GI bleed - Hemoccult positive on admit.  PPI         Subjective:     Chief Complaint:  stable    ROS:  (bold if positive, if negative)    SOB/DOETolerating minimal PT Tolerating minimal Diet        Objective:     Last 24hrs VS reviewed since prior progress note. Most recent are:    Vitals:    12/02/21 0021 12/02/21 0350 12/02/21 0430 12/02/21 0600   BP: 118/73  108/68    Pulse: (!) 124 (!) 118 (!) 120 (!) 120   Resp: 16  16    Temp: 97.7 F (36.5 C)  97.3 F (36.3 C)    TempSrc: Axillary  Oral    SpO2: 97%  97%    Weight:       Height:          '@lastspo2withflo'$ @     No intake or output data in the 24 hours ending 12/02/21 0752     Physical Exam:    Gen: Frail, ill appearing, in no acute distress  HEENT:  Pink conjunctivae, PERRL, hearing barely intact to voice, moist mucous membranes  Neck:  Supple, without masses, thyroid non-tender  Resp:  No accessory muscle use, clear breath sounds without  wheezes rales or rhonchi  Card:  No murmurs, tachycardic S1, S2 without thrills, bruits or peripheral edema  Abd:  Soft, non-tender, non-distended, normoactive bowel sounds are present, no mass  Lymph:  No cervical or inguinal adenopathy  Musc:  No cyanosis or clubbing  Skin:  No rashes or ulcers, skin turgor is good  Neuro:  Cranial nerves are grossly intact, general motor weakness, does not follows commands appropriately  Psych:  Poor insight, not oriented    Telemetry reviewed:   normal sinus rhythm  __________________________________________________________________  Medications Reviewed: (see below)  Medications:     Current Facility-Administered Medications   Medication Dose Route Frequency    citalopram (CELEXA) tablet 10 mg  10 mg Oral Daily    metoclopramide (REGLAN) tablet 5 mg  5 mg Oral TID AC    pantoprazole (PROTONIX) tablet 40 mg  40 mg Oral QAM AC    potassium & sodium phosphates (PHOS-NAK) 280-160-250 MG packet 250 mg  1 packet Oral BID with meals     polyethylene glycol (GLYCOLAX) packet 17 g  17 g Oral TID    sennosides-docusate sodium (SENOKOT-S) 8.6-50 MG tablet 2 tablet  2 tablet Oral BID    bisacodyl (DULCOLAX) suppository 10 mg  10 mg Rectal Daily PRN    mirtazapine (REMERON) tablet 7.5 mg  7.5 mg Oral Nightly    prochlorperazine (COMPAZINE) suppository 25 mg  25 mg Rectal Q12H PRN    prochlorperazine (COMPAZINE) tablet 5 mg  5 mg Oral Q6H PRN    apixaban (ELIQUIS) tablet 5 mg  5 mg Oral BID    midodrine (PROAMATINE) tablet 2.5 mg  2.5 mg Oral BID    sodium chloride flush 0.9 % injection 5-40 mL  5-40 mL IntraVENous 2 times per day    sodium chloride flush 0.9 % injection 5-40 mL  5-40 mL IntraVENous PRN    0.9 % sodium chloride infusion   IntraVENous PRN    ondansetron (ZOFRAN-ODT) disintegrating tablet 4 mg  4 mg Oral Q8H PRN    Or    ondansetron (ZOFRAN) injection 4 mg  4 mg IntraVENous Q6H PRN    polyethylene glycol (GLYCOLAX) packet 17 g  17 g Oral Daily PRN    acetaminophen (TYLENOL) tablet 650 mg  650 mg Oral Q6H PRN    Or    acetaminophen (TYLENOL) suppository 650 mg  650 mg Rectal Q6H PRN    predniSONE (DELTASONE) tablet 10 mg  10 mg Oral Daily        Lab Data Reviewed: (see below)  Lab Review:     Recent Labs     11/30/21  0633 12/01/21  0500   WBC 16.1* 23.4*   HGB 11.8 14.0   HCT 37.3 44.8   PLT 256 240       Recent Labs     11/30/21  0633 11/30/21  2306 12/01/21  1605   NA 129* 127* 128*   K 4.2 5.0 4.4   CL 90* 91* 92*   CO2 32 23 30   GLUCOSE 93 148* 168*   BUN 41* 48* 54*   CREATININE 0.96 1.28* 1.42*   CALCIUM 9.6 9.5 9.7   MG 1.9  --   --    PHOS 2.1*  --   --    LABALBU 2.2*  --   --    AST 72*  --   --    ALT 80*  --   --  Lab Results   Component Value Date/Time    GLUCOSE 168 12/01/2021 04:05 PM    GLUCOSE 148 11/30/2021 11:06 PM    GLUCOSE 93 11/30/2021 06:33 AM    GLUCOSE 86 11/29/2021 07:27 AM    GLUCOSE 112 11/28/2021 10:21 AM     No results for input(s): PH, PCO2, PO2, HCO3, FIO2 in the last 72 hours.  No results for  input(s): INR in the last 72 hours.  Results       Procedure Component Value Units Date/Time    Culture, Blood 2 [[id number]] Collected: 12/01/21 0500    Order Status: Completed Specimen: Blood Updated: 12/01/21 0850     Special Requests NO SPECIAL REQUESTS        Culture NO GROWTH <24 HRS       Culture, Blood 1 [[id number]] Collected: 11/30/21 2303    Order Status: Completed Specimen: Blood Updated: 12/01/21 2355     Special Requests NO SPECIAL REQUESTS        Culture NO GROWTH 1 DAY       Culture, Urine [[id number]]     Order Status: Canceled Specimen: Urine, clean catch     Blood Culture 2 [[id number]]     Order Status: Canceled Specimen: Blood     Blood Culture 1 [[id number]]     Order Status: Canceled Specimen: Blood     Culture, Urine [[id number]] Collected: 11/20/21 1213    Order Status: Completed Specimen: Urine, clean catch Updated: 11/21/21 1137     Special Requests NO SPECIAL REQUESTS        Culture No growth (<1,000 CFU/ML)       Blood Culture 1 [[id number]]  (Abnormal) Collected: 11/20/21 1212    Order Status: Completed Specimen: Blood Updated: 11/22/21 1009     Special Requests NO SPECIAL REQUESTS        Culture       STAPHYLOCOCCUS SPECIES, COAGULASE NEGATIVE GROWING IN 1 OF 1 BOTTLES DRAWN SITE=LHAND            (NOTE) [facility] CALLED TO [facility] RN AT 0945 8.28.23 RB    Blood Culture 2 [[id number]] Collected: 11/20/21 1212    Order Status: Completed Specimen: Blood Updated: 11/25/21 1512     Special Requests NO SPECIAL REQUESTS        Culture NO GROWTH 5 DAYS       Culture, Blood, PCR ID Panel [[id number]]  (Abnormal) Collected: 11/20/21 1212    Order Status: Completed Specimen: Blood Updated: 11/21/21 1055     Accession Number [id number]     Enterococcus faecalis by PCR Not detected        Enterococcus faecium by PCR Not detected        Listeria monocytogenes by PCR Not detected        STAPHYLOCOCCUS Detected        Staphylococcus Aureus Not detected        Staphylococcus epidermidis by PCR Not  detected        Staphylococcus lugdunensis by PCR Not detected        STREPTOCOCCUS Not detected        Streptococcus agalactiae (Group B) Not detected        Strep pneumoniae Not detected        Strep pyogenes,(Grp. A) Not detected        Acinetobacter calcoac baumannii complex by PCR Not detected        Bacteroides fragilis by PCR Not detected        Enterobacteriaceae  by PCR Not detected        Enterobacter cloacae complex by PCR Not detected        Escherichia Coli Not detected        Klebsiella aerogenes by PCR Not detected        Klebsiella oxytoca by PCR Not detected        Klebsiella pneumoniae group by PCR Not detected        Proteus by PCR Not detected        Salmonella species by PCR Not detected        Serratia marcescens by PCR Not detected        Haemophilus Influenzae by PCR Not detected        Neisseria meningitidis by PCR Not detected        Pseudomonas aeruginosa Not detected        Stenotrophomonas maltophilia by PCR Not detected        Candida albicans by PCR Not detected        Candida auris by PCR Not detected        Candida glabrata Not detected        Candida krusei by PCR Not detected        Candida parapsilosis by PCR Not detected        Candida tropicalis by PCR Not detected        Cryptococcus neoformans/gattii by PCR Not detected        Resistant gene targets          [employer] test comment       False positive results may rarely occur. Correlate with clinical,epidemiologic, and other laboratory findings           Comment: Please see BCID Interpretation Guide in EPIC Links               Other pertinent lab: none    Total time spent with patient: 30 Minutes I personally reviewed chart, notes, data and current medications in the medical record.  I have personally examined and treated the patient at bedside during this period.                  Care Plan discussed with: Patient, Family, Care Manager, Nursing Staff, Consultant/Specialist, and >50% of time spent in counseling and coordination of  care    Discussed:  Care Plan and D/C Planning    Prophylaxis:  H2B/PPI and Elquis    Disposition:   hospice           ___________________________________________________    Attending Physician: Gara Kroner, MD

## 2021-12-02 NOTE — Care Coordination-Inpatient (Signed)
Transition of Care Plan:     RUR: 18% Moderate  Prior Level of Functioning: Required some assistance with ADLs-living in camper with husband on daughter's property  Disposition: Hospice  If SNF or IPR: Date FOC offered: 11/25/21  Date FOC received: Pending  Accepting facility:   Date authorization started with reference number:   Date authorization received and expires:   Follow up appointments: NA  DME needed: Owns wheelchair and walker  Transportation at discharge: BLS  IM/IMM Medicare/Tricare letter given: 11/23/21;  Is patient a Veteran and connected with VA? NA              If yes, was [employer] transfer form completed and VA notified?   Caregiver Contact: Husband Netanya Yazdani, [phone]  Discharge Caregiver contacted prior to discharge? Yes, husband  Care Conference needed? NA  Barriers to discharge: NA    CM received hospice consult, referral placed to [facility]. CM to monitor.     Maud Deed Charles-Bland,MS  [phone]

## 2021-12-02 NOTE — Progress Notes (Signed)
Chaplain attempted a visit for patient in 2N. Reviewed patient's chart.  Patient was sleeping. No family present. Please contact [facility] for further referrals.    Chaplain Zalan Shidler  Page a Clinical biochemist at [phone])

## 2021-12-02 NOTE — Discharge Summary (Addendum)
Physician Discharge Summary     Patient ID:  Ariel Day  [id number]  76 y.o.  04/11/1945    Admit date: 11/20/2021    Discharge date of service and time: 12/05/2021  Greater than 30 minutes were spent providing discharge related services for this patient    Admission Diagnoses: Hypercalcemia [E83.52]  Abnormal LFTs [R79.89]  Acute encephalopathy [G29.52]  Complicated UTI (urinary tract infection) [N39.0]  Carcinoma of breast metastatic to bone, unspecified laterality ([place]) [C50.919, C79.51]  AMS (altered mental status) [R41.82]    Discharge Diagnoses:    Principal Diagnosis   AMS (altered mental status)                                             Hospital Course and other diagnoses  Acute metabolic encephalopathy - POA due to Ca and other terminal issues. Now stable but poor.  Family asks to stop venlafaxine.  End stage.  Palliative consulted.  Hospice is a reasonable option, and the family has agreed to this.  Hospice info session set up. Case management to assist. Awaiting final plan.     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  Debility prevents any further treatment. Capecitabine on hold. Patient to enter hospice.     Acute deep vein thrombosis (DVT) of femoral vein of left lower extremity - POA, new, extensive.  Vascular surgery consulted but no intervention. Now on eliquis, for comfort to avoid DVT pain     Malignant pleural effusion - POA and she has twice had therapeutic thoracentesis in last week.  Would like pleurex prior to discharge, for hospice palliative care.  Cannot be done in hospital.  IR recommends family call [phone] when re accumulated and the will place pleurex outpatient     Hypercalcemia / Hyponatremia / AKI (acute kidney injury) / Dehydration / Elevated lactic acid level - Ca max 13.4 on admit. Due to mets. Got IV zometa.  Ca now better. Lactic acid in part due to liver mets.  Hydrating.  Na worsening. On  hospice, stop checking     Debility / Neoplastic malignant related fatigue / Anorexia / Hypoalbuminemia - POA, severe, end stage.  She is not tolerating supplements. No reasonable hope of physical improvement. Hospice is a reasonable option      Orthostatic hypotension - Typically on midodrine, but not an issue on hospice      SIRS criteria / Leukocytosis / Sinus tachycardia - UA mostly normal and Ur cx negative. Blood cx with only CNSA contaminant. Steroids may have elevated WBC. She has an AICD which I have asked cardiology to deactivate for hospice.     Anxiety and depression - stop venlafaxine, start celexa and continue remeron     GI bleed - Hemoccult positive on admit.  PPI    PCP: Trula Ore, MD    Consults: nephrology, hematology/oncology, and vascular surgery    Significant Diagnostic Studies: See Hospital Course    Discharged home in improved condition.    Discharge Exam:  BP: 100/74   Pulse: (!) 113   Resp: 20   Temp:  98.4   TempSrc: Oral   SpO2: 95%   Weight:  67 kg   Height:  165 cm         Gen: Frail, ill appearing, in no acute  distress  HEENT:  Pink conjunctivae, PERRL, hearing barely intact to voice, moist mucous membranes  Neck:  Supple, without masses, thyroid non-tender  Resp:  No accessory muscle use, clear breath sounds without wheezes rales or rhonchi  Card:  No murmurs, tachycardic S1, S2 without thrills, bruits or peripheral edema  Abd:  Soft, non-tender, non-distended, normoactive bowel sounds are present, no mass  Lymph:  No cervical or inguinal adenopathy  Musc:  No cyanosis or clubbing  Skin:  No rashes or ulcers, skin turgor is good  Neuro:  Cranial nerves are grossly intact, general motor weakness, does not follows commands appropriately  Psych:  Poor insight, not oriented    Patient Instructions:   Current Discharge Medication List        START taking these medications    Details   citalopram (CELEXA) 10 MG tablet Take 1 tablet by mouth daily  Qty: 30 tablet, Refills: 0       apixaban (ELIQUIS) 5 MG TABS tablet Take 1 tablet by mouth 2 times daily  Qty: 60 tablet, Refills: 0      mirtazapine (REMERON) 7.5 MG tablet Take 1 tablet by mouth nightly  Qty: 30 tablet, Refills: 0           CONTINUE these medications which have NOT CHANGED    Details   predniSONE (DELTASONE) 10 MG tablet Take 1 tablet by mouth daily      midodrine (PROAMATINE) 2.5 MG tablet Take 1 tablet by mouth 2 times daily           STOP taking these medications       spironolactone (ALDACTONE) 25 MG tablet Comments:   Reason for Stopping:         carvedilol (COREG) 12.5 MG tablet Comments:   Reason for Stopping:         capecitabine (XELODA) 500 MG chemo tablet Comments:   Reason for Stopping:             Activity: activity as tolerated  Diet: regular diet  Wound Care: none needed    Follow-up with your PCP and hospice in 1 day.  Follow-up tests/labs - none    Signed:  Gara Kroner, MD  12/05/2021  8:53 AM

## 2021-12-02 NOTE — Progress Notes (Signed)
Bladder scan - 787 ml, verbal order received to perform straight cath. Able to take out 300 ml. Post straight cath bladder scan measures 664 ml. Md notified. No order at this time.

## 2021-12-02 NOTE — Progress Notes (Signed)
RENAL  PROGRESS NOTE        Subjective:   resting  Objective:   VITALS SIGNS:    BP 117/73   Pulse (!) 127   Temp 97.3 F (36.3 C) (Oral)   Resp 22   Ht 1.651 m ('5\' 5"'$ )   Wt 67.2 kg (148 lb 2.4 oz)   SpO2 100%   BMI 24.65 kg/m             Temp (24hrs), Avg:97.8 F (36.6 C), Min:97.3 F (36.3 C), Max:98.4 F (36.9 C)         PHYSICAL EXAM:  NAD    DATA REVIEW:     INTAKE / OUTPUT:   Last shift:      No intake/output data recorded.  Last 3 shifts: 09/06 1901 - 09/08 0700  In: 150 [P.O.:150]  Out: 450 [Urine:450]  No intake or output data in the 24 hours ending 12/02/21 1301        LABS:   Recent Labs     11/30/21  0633 12/01/21  0500   WBC 16.1* 23.4*   HGB 11.8 14.0   HCT 37.3 44.8   PLT 256 240       Recent Labs     11/30/21  0633 11/30/21  2306 12/01/21  1605   NA 129* 127* 128*   K 4.2 5.0 4.4   CL 90* 91* 92*   CO2 32 23 30   BUN 41* 48* 54*   MG 1.9  --   --    PHOS 2.1*  --   --    ALT 80*  --   --        Estimated Creatinine Clearance: 32 mL/min (A) (based on SCr of 1.42 mg/dL (H)).       Assessment:   Assessment:     AKI: Serum Cr 1.'3mg'$ /dl->> improved to 1.1-> 1.'0mg'$ /dl and holding steady. Suspected pre-renal state.    hyponatremia  Hypercalcemia: Peak corrected Ca 13.4. Improving with Calcitonin/Zometa (8/28). Corrected Ca hovering in the mid 10s now.  Etiology likely 2 to metastatic Breast CA.   low Phos,hyponatremia  Combined Systolic/Diastolic CHF: +pulm edema/b/l pleural effusions on presentation     Metastatic Breast CA     Staph Bacteremia-> contaminant     Hypokalemia/Hypophosphatemia        Dysphagia        Plan/Recommendations:  Po phos   Overall prognosis guarded/poor-palliative care consulting. Now DNR  No labs today  PRN loop diuretics  Will follow back on Monday;please call us if needed over the weekend  Beverly Gust, MD

## 2021-12-02 NOTE — Progress Notes (Signed)
Physical Therapy  Chart reviewed and spoke with nursing.  Patient is now comfort and likely going home with hospice.  Will complete orders at this time.  Anona Giovannini Bartholome Bill, PT

## 2021-12-02 NOTE — Progress Notes (Signed)
Occupational Therapy    Chart reviewed.  Note patient resting HR 120s.  In last session on 11/30/21, patient had very limited activity tolerance and ~200 beat run of V-tach with standing attempt.  Note patient is now expecting to d/c home with hospice.  Will complete OT order.    Abelino Derrick, OTR/L

## 2021-12-02 NOTE — Plan of Care (Signed)
Problem: Discharge Planning  Goal: Discharge to home or other facility with appropriate resources  Outcome: Progressing     Problem: Safety - Adult  Goal: Free from fall injury  Outcome: Progressing

## 2021-12-02 NOTE — Progress Notes (Signed)
[place] [facility]  [address] [place] [address]  W: [phone]  F: [phone]    Reason for Evaluation:   Ariel Day is a 76 y.o. 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      Hematology/Oncology Treatment History:   Follows at [facility] with Dr. Stephens November  ER+/PR+ lobular breast cancer diagnosed 12/20/2015 after presenting with multifocal bone mets  Current treatment: oral capecitabine 500 mg BID one week on, one week off.  Has been on hold since early August.      History of Present Illness:   Ariel Day is a pleasant 76 y.o. female who presents today for evaluation of metastatic breast cancer.      Patient admitted with complaints of generalized weakness, decreased PO intake, and confusion. Vitals on admission were stable.  Labs notable for WBC 20.3, left-shifted, Na 131, K 6.2, Cr 1.31, calcium 13.5, AST 112, alk phos 299, albumin 2.6.  CXR showed pulmonary edema with bilateral pleural effusions as well as lytic lesions in scapulae. CT head negative for acute abnormality.  UA with small blood and 1+ bacteria. She was treated with Lasix and empiric cefepime and admitted to medicine.    CT A/P noted large filling defect extending from L femoral vein to inferior portion of IVC, concerning for DVT; multiple hepatic mets; diffuse skeletal mets; and small volume ascites.  Doppler US with acute occlusive thrombus in L external iliac, common femoral, saphenofemoral junction, profunda femoral, and femoral. Subacute occlusive thrombus in the popliteal and posterior tibial veins. Limited visualization of the peroneal veins but show color filling; cannot exclude non-occlusive thrombus. No DVT in RLE.    At my evaluation, patient remains confused.  She is able to  answer some questions but not fully provide history.  Her husband states that they called their oncologist on Friday about the confusion and dehydration, and he advised trying Pedialyte/hydration, and if persisted to present to ER.  She has been off her capecitabine for ~2 weeks.    Interval History:  Patient reports feeling nauseated today.  Husband states that she was much more out of it yesterday.  She did not tolerate the Effexor at all.  Plan is to meet with hospice agency today.    Social History:  Lives in a motor home.  Building a home in [place] was obtained and pertinent findings reviewed above. Past medical history, social history, family history, medications, and allergies are located in the electronic medical record.    Physical Exam:   BP 117/73   Pulse (!) 127   Temp 97.3 F (36.3 C) (Oral)   Resp 22   Ht '5\' 5"'$  (1.651 m)   Wt 148 lb 2.4 oz (67.2 kg)   SpO2 100%   BMI 24.65 kg/m   ECOG PS: 3  General: awake   Respiratory: normal respiratory effort,  CV: tachycardic, no peripheral edema  Neuro: alert, oriented to self and place    Results:     Lab Results   Component Value Date/Time    WBC 23.4 12/01/2021 05:00 AM    HGB 14.0 12/01/2021 05:00 AM    HCT 44.8 12/01/2021 05:00 AM    PLT 240 12/01/2021 05:00 AM    MCV 103.5 12/01/2021  05:00 AM     Lab Results   Component Value Date/Time    NA 128 12/01/2021 04:05 PM    K 4.4 12/01/2021 04:05 PM    CL 92 12/01/2021 04:05 PM    CO2 30 12/01/2021 04:05 PM    BUN 54 12/01/2021 04:05 PM     Lab Results   Component Value Date/Time    ALT 80 11/30/2021 06:33 AM    GLOB 4.0 11/30/2021 06:33 AM     Records from Epic reviewed and summarized above.  Test results above have been reviewed.    Imaging:     CT Result (most recent):  CT ABDOMEN PELVIS W IV CONTRAST 11/20/2021    Narrative  EXAM: CT ABDOMEN PELVIS W IV CONTRAST    INDICATION: abd pain w/ AMS and N/V    COMPARISON: 10/07/2021    CONTRAST: 100 mL of Isovue-370.    ORAL CONTRAST:  None    TECHNIQUE:  Following the intravenous administration of intravenous contrast, axial images  were obtained through the abdomen and pelvis. Coronal and sagittal  reconstructions were generated. CT dose reduction was achieved through use of a  standardized protocol tailored for this examination and automatic exposure  control for dose modulation.    FINDINGS:  There are moderate-sized bilateral pleural effusions with adjacent compressive  atelectasis.    A 2.4 cm cyst is noted in the right hepatic lobe.  There are multiple masses in  the liver are again visualized compatible with diffuse metastatic disease.  One  of the largest is located in the posterior right hepatic lobe measures up to 2.5  cm (series 3 image number 46).   The spleen enhances appropriately.   The  pancreas enhances homogeneously. No discrete masses.  The gallbladder is  surgically absent.  No significant biliary dilatation.    The adrenal glands appear normal.    The kidneys enhance symmetrically. No hydronephrosis.    Visualized portions of the bladder are grossly unremarkable.  Pelvic structures  are not well visualized because of metallic streak artifact from bilateral hip  arthritis hardware.    The uterus is present.    There is a small volume of ascites in the abdomen.  There is no free air.  There  are no abscesses.    No significant lymphadenopathy.    No evidence of intestinal obstruction.  The appendix appears normal.    Aorta is normal in caliber.  There is apparent extensive filling defect from the left femoral vein to the  very inferior aspect of the inferior vena cava compatible with large DVT.    Diffuse skeletal metastatic disease is again noted.    Impression  1.  Large filling defect extending from the left femoral vein to the inferior  portion of the inferior vena cava concerning for deep venous thrombosis.    2.  Multiple hepatic masses are again noted compatible with hepatic metastatic  disease.    3.  Diffuse skeletal  metastases are again visualized.    4.  Small volume of ascites.      The findings were conveyed to [facility]  via perfectserve at 11/20/2021 5:54 PM  by Denna Haggard.  [place]      Imaging personally reviewed by me    Assessment:   1) Metastatic lobular breast cancer (ER+/PR+)  Follows with Dr. Stephens November at [facility].  Sites of disease include diffuse bone mets, liver mets, and malignant pleural effusion. Currently on oral capecitabine  500 mg BID one week on, one week off.  It appears this was started in 2021 per his notes.  This has been on hold since early-mid August.    Family has elected to go on hospice.  Agree with this decision.     #) Hypercalcemia of malignancy, improving:  Presented with calcium 12.3, corrected 13.5  - Status post calcitonin 4 units/kg x 4 doses and Zometa x 1 dose.  Calcium improving, now down to corrected ~mid 10s.     #) Constipation: due to hypercalcemia  - Bowel regimen with miralax TID, seena 2 tabs BID, and prn dulcolax suppository    #) Extensive acute left proximal and distal vein DVT:  Incidentally noted on CT A/P with filling defect from L femoral vein extending to IVC.  Follow up Doppler with extensive DVT throughout L external ilac to femoral vein and subacute occlusive thrombus in popliteal and posterior tibial veins.  Risk factors: a) metastatic breast cancer  - Continue Eliquis, can transition to 5 mg BID once load complete; recommend lifelong AC    #) Mood symptoms: did not tolerate Effexor    #) Orthostatic hypotension: midodrine per medicine; consider increasing to TID    #) Adrenal insufficiency: on chronic prednisone 10 mg daily    Oncology is available as needed. Thank you for involving me in the care of this patient.       Signed By: Kristopher Oppenheim, MD

## 2021-12-03 NOTE — Progress Notes (Addendum)
Hospice Liaison Note:    Chart reviewed for updates in plan of care. Patient has not required PRN for symptom management.    Plan: Family meeting this afternoon around 14:00.  Continue to offer Hospice support/education as discharge plans are being made    Please call Hospice team to offer support for patient, family or staff.    Thank you for your coordination with the hospice plan of care.    13:50: Bedside. Patient in bed, eyes open. She does not appear to be in distress. Pt husband, Ariel Day is bedside.   Hospice plan to return when daughter is bedside. Hospice contact number left on white-board.      16:00: Meeting bedside with patient, her husband Ariel Day and daughter, Ariel Day. Reviewed Hospice goals of care and philosophy. Patient is able to participate in conversation with head nods at times. Pt is able to express that she wishes to go home with Hospice support. Pt denies pain. She admits to frequent nausea, and family share that Zofran is keeping nausea under control. No reported vomiting. Pt is on room air without distress, however, she admits to feeling short of breath at times. Daughter bedside share pt's oncologist was planning to add home oxygen for comfort.    Reviewed with family patient's CODE status and active AICD. Education provided to align with goals of care, recommend deactivation of defibrillator. Daughter was unsure to agree with this process. Encouraged family to discuss. Pt was able to state that she does not want artifical resuscitation if her heart stops, and family state agreement with DDNR. Pt husband states his agreement with turning off defibrillator prior to discharge home, however, he is unclear as to the maker. Husband thinks it is a [place]. Family to confirm.    Pt daughter express concern over frequent fluid buildup requiring interventions as draining; Malignant pleural effusion - POA and she has twice had therapeutic thoracentesis in last week.  Would  like pleurex prior to discharge, for hospice palliative care.     Support/Education provided that per records, patient did not have enough fluid to warrant a permanent drain, however, if patient becomes symptomatic once home, Hospice medical director will review benefits vs burden of having a drain placed.  Encouraged family bedside to request assessment prior to discharge Monday by medical team.    Patient's husband completed Hospice consents for Hospice services to begin on Monday 12/05/2021 once patient is home.    Request CM arrange transportation home for patient to coincide with a Home Hospice Admission scheduled for Monday at 15:00.    Ariel Igo, RN, [place] Nurse Liaison  [phone] Mobile  [phone] Office

## 2021-12-03 NOTE — Progress Notes (Signed)
[facility]  [phone]    Hospice Nursing PRE-Admission   Discharge Summary  Patient Name: Ariel Day  Date of Birth: 02/22/1946  Age: 76 y.o.       Date of PLANNED Hospice Admission: Monday 12/05/2021 at 15:00  Hospice Attending: Dr. Sherrie Day  Primary Care Physician: Ariel Ore, MD     Home Hospice Address:  [place] [address]    Primary Contact and Phone:  Husband: Ariel Day [phone]  Daughter: Ariel Day [phone] (Lives in the same house)      ADVANCE CARE PLANNING    Code Status: DNR  Durable DNR: _0   Yes  _1   No Pt family has signed. On chart for MD signature.      12/02/2021     8:38 AM   Demographics   Marital Status Married       Religion: Mormon  Funeral Home: Plan to be cremated. Appreciate assistance with deciding which service to use. Family recently relocated to [facility].    HOSPICE SUMMARY     Verbal CTI of terminal diagnosis with life expectancy of 6 months or less received from: Dr. Marlaine Day    For the Hospice Diagnosis of: Metastatic Breast Cancer    NCD: Requested/Declined      CLINICAL INFORMATION   Allergies: No Known Allergies      Currently this patient has:  Family plan to re-assess need for Thoracic Drain prior to discharge. Will request placement of permanent drain if IR plans to drain. Updated Dr. Percell Day.    _2  Supplemental O2     _3  PICC    _4  PORT   _5  Foley Catheter _6  Ostomy  _7  NG Tube  _8  PEG Tube    _9  Wounds       Does patient have an AICD device: _10  Yes     _11  No    Has ICD been deactivated?             _12  Yes     _13  No   Pt was able to state that she does not want artifical resuscitation if her heart stops, and family state agreement with DDNR. Pt husband states his agreement with turning off defibrillator prior to discharge home, however, he is unclear as to the maker. Husband thinks it is a [place]. Family to confirm.    Perfect Serve message sent to Dr. Percell Day to  address order to have AICD deactivated Sunday.      COVID Screening: Patient and husband had COVID April 2022      ASSESSMENT & PLAN     1. Symptom Issues Identified: Pt biggest complain is fatigue, weakness and Nausea    2. Spiritual Issues  Identified: NONE    3.  Psych/ Social/ Emotional Issues Identified: Pt and husband recently relocated to [facility] a few months ago. Lived in [place], then moved to [place] for a short time. Pt's daughter, Ariel Day, added onto her house in [place], and patient/husband recently moved in with her.      CARE COORDINATION           Hospice Consents: Completed by patient's husband and scanned to consent folder.    2. DME Ordered/Company/Delivery Plan: Delivery Order number [id number] has been created to be delivered to home Sunday between 12:00-4:00pm:  OCT, Oxygen & accessories, Gel overlay, BSC and wheelchair. Family own hospital bed.  3.   Unique home needs for safety: Bariatric patient  NO    4. Symptom Kit and other Medications Needs: Ordered for family to pick up Monday at [facility] pharmacy by noon. Included liquid Dilaudid, liquid lorazepam. Zofran 4 mg tabs ODT. Pt recently switched from Effexor to Celexa, and may need this medication Request Processed   Confirmation #: [id number]   5:37 PM  12/03/2021  Request Processed  Confirmation #: [id number]  5:35 PM   12/03/2021      5. Home Admission Reservation: Monday 3:00 pm    6.    Transportation by: Plan to request for transportation on Monday   Scheduled for TBD       7.    Verbal Report/Handoff given to: Hospice Home Team     8. Phone number to Hospital: [phone]    2. Supplies/Wound Care: TBD      Ariel Igo, RN, [place] Nurse Liaison  [phone] Mobile  [phone] Office  Available on Perfect Serve

## 2021-12-03 NOTE — Progress Notes (Signed)
Hospice Liaison Note:    Family confirm patient has an active AICD, and are unsure of Maker. Family to verify. Plan to have deactivated prior to discharge home Monday.    Family request to have patient assessed for thoracic drain prior to discharge. If patient is taken for drain, please insert a permanent drain for home with hospice.    Both of these requests have been sent to Dr. Percell Miller via Perfect Serve.      Thank you for your coordination with the hospice plan of care      Jobe Igo, RN, [place] Nurse Liaison  [phone] Mobile  [phone] Office

## 2021-12-03 NOTE — Progress Notes (Signed)
[facility]    Progress Note    NAME: Ariel Day   DOB:  1946-03-11  MRM:  [id number]    Date/Time of service 12/03/2021  9:18 AM    To assist coordination of care and communication with nursing and staff, this note may be preliminary early in the day, but finalized by end of the day.        Assessment and Plan:     Acute metabolic encephalopathy - POA due to Ca and other terminal issues. Now stable but poor.  Family asks to stop venlafaxine.  End stage.  Palliative consulted.  Hospice is a reasonable option, and the family has agreed to this.  Hospice info session set up. Case management to assist. Awaiting final plan.    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  Debility prevents any further treatment. Capecitabine on hold. Patient to enter hospice.    Acute deep vein thrombosis (DVT) of femoral vein of left lower extremity - POA, new, extensive.  Vascular surgery consulted but no intervention. Now on eliquis, for comfort    Malignant pleural effusion - POA and she has twice had therapeutic thoracentesis in last week.  Would like pleurex prior to discharge, for hospice palliative care.    Hypercalcemia / Hyponatremia / AKI (acute kidney injury) / Dehydration / Elevated lactic acid level - Ca max 13.4 on admit. Due to mets. Got IV zometa.  Ca now better. Lactic acid in part due to liver mets.  Hydrating.  Na worsening. On hospice, stop checking    Debility / Neoplastic malignant related fatigue / Anorexia / Hypoalbuminemia - POA, severe, end stage.  She is not tolerating supplements. No reasonable hope of physical improvement. Hospice is a reasonable option      Orthostatic hypotension - Typically on midodrine, but not an issue on hospice      SIRS criteria / Leukocytosis / Sinus tachycardia - UA mostly normal and Ur cx negative. Blood cx with only CNSA contaminant. Steroids may have elevated WBC    Anxiety  and depression - stop venlafaxine, start celexa and continue remeron    GI bleed - Hemoccult positive on admit.  PPI         Subjective:     Chief Complaint:  stable,awaiting hospice plan    ROS:  (bold if positive, if negative)    SOB/DOETolerating minimal PT Tolerating minimal Diet        Objective:     Last 24hrs VS reviewed since prior progress note. Most recent are:    Vitals:    12/02/21 2010 12/03/21 0003 12/03/21 0410 12/03/21 0817   BP: 103/70 111/75 112/69 113/69   Pulse: (!) 120 (!) 117 (!) 113 (!) 112   Resp: (!) '6  18 18   '$ Temp: 97.9 F (36.6 C)  97.5 F (36.4 C) 97.3 F (36.3 C)   TempSrc: Oral Oral Oral Oral   SpO2: 99% 98% 97% 95%   Weight:       Height:          '@lastspo2withflo'$ @       Intake/Output Summary (Last 24 hours) at 12/03/2021 7425  Last data filed at 12/02/2021 1442  Gross per 24 hour   Intake --   Output 300 ml   Net -300 ml        Physical Exam:    Gen: Frail, ill appearing, in  no acute distress  HEENT:  Pink conjunctivae, PERRL, hearing barely intact to voice, moist mucous membranes  Neck:  Supple, without masses, thyroid non-tender  Resp:  No accessory muscle use, clear breath sounds without wheezes rales or rhonchi  Card:  No murmurs, tachycardic S1, S2 without thrills, bruits or peripheral edema  Abd:  Soft, non-tender, non-distended, normoactive bowel sounds are present, no mass  Lymph:  No cervical or inguinal adenopathy  Musc:  No cyanosis or clubbing  Skin:  No rashes or ulcers, skin turgor is good  Neuro:  Cranial nerves are grossly intact, general motor weakness, does not follows commands appropriately  Psych:  Poor insight, not oriented    Telemetry reviewed:   normal sinus rhythm  __________________________________________________________________  Medications Reviewed: (see below)  Medications:     Current Facility-Administered Medications   Medication Dose Route Frequency    citalopram (CELEXA) tablet 10 mg  10 mg Oral Daily    metoclopramide (REGLAN) tablet 5 mg  5 mg Oral  TID AC    pantoprazole (PROTONIX) tablet 40 mg  40 mg Oral QAM AC    polyethylene glycol (GLYCOLAX) packet 17 g  17 g Oral TID    sennosides-docusate sodium (SENOKOT-S) 8.6-50 MG tablet 2 tablet  2 tablet Oral BID    bisacodyl (DULCOLAX) suppository 10 mg  10 mg Rectal Daily PRN    mirtazapine (REMERON) tablet 7.5 mg  7.5 mg Oral Nightly    prochlorperazine (COMPAZINE) suppository 25 mg  25 mg Rectal Q12H PRN    prochlorperazine (COMPAZINE) tablet 5 mg  5 mg Oral Q6H PRN    apixaban (ELIQUIS) tablet 5 mg  5 mg Oral BID    midodrine (PROAMATINE) tablet 2.5 mg  2.5 mg Oral BID    sodium chloride flush 0.9 % injection 5-40 mL  5-40 mL IntraVENous 2 times per day    sodium chloride flush 0.9 % injection 5-40 mL  5-40 mL IntraVENous PRN    0.9 % sodium chloride infusion   IntraVENous PRN    ondansetron (ZOFRAN-ODT) disintegrating tablet 4 mg  4 mg Oral Q8H PRN    Or    ondansetron (ZOFRAN) injection 4 mg  4 mg IntraVENous Q6H PRN    polyethylene glycol (GLYCOLAX) packet 17 g  17 g Oral Daily PRN    acetaminophen (TYLENOL) tablet 650 mg  650 mg Oral Q6H PRN    Or    acetaminophen (TYLENOL) suppository 650 mg  650 mg Rectal Q6H PRN    predniSONE (DELTASONE) tablet 10 mg  10 mg Oral Daily        Lab Data Reviewed: (see below)  Lab Review:     Recent Labs     12/01/21  0500   WBC 23.4*   HGB 14.0   HCT 44.8   PLT 240       Recent Labs     11/30/21  2306 12/01/21  1605   NA 127* 128*   K 5.0 4.4   CL 91* 92*   CO2 23 30   GLUCOSE 148* 168*   BUN 48* 54*   CREATININE 1.28* 1.42*   CALCIUM 9.5 9.7       Lab Results   Component Value Date/Time    GLUCOSE 168 12/01/2021 04:05 PM    GLUCOSE 148 11/30/2021 11:06 PM    GLUCOSE 93 11/30/2021 06:33 AM    GLUCOSE 86 11/29/2021 07:27 AM    GLUCOSE 112 11/28/2021 10:21 AM  No results for input(s): "PH", "PCO2", "PO2", "HCO3", "FIO2" in the last 72 hours.  No results for input(s): "INR" in the last 72 hours.  Results       Procedure Component Value Units Date/Time    Culture, Blood 2  [[id number]] Collected: 12/01/21 0500    Order Status: Completed Specimen: Blood Updated: 12/03/21 0850     Special Requests NO SPECIAL REQUESTS        Culture NO GROWTH 2 DAYS       Culture, Blood 1 [[id number]] Collected: 11/30/21 2303    Order Status: Completed Specimen: Blood Updated: 12/02/21 2355     Special Requests NO SPECIAL REQUESTS        Culture NO GROWTH 2 DAYS       Culture, Urine [[id number]]     Order Status: Canceled Specimen: Urine, clean catch     Blood Culture 2 [[id number]]     Order Status: Canceled Specimen: Blood     Blood Culture 1 [[id number]]     Order Status: Canceled Specimen: Blood     Culture, Urine [[id number]] Collected: 11/20/21 1213    Order Status: Completed Specimen: Urine, clean catch Updated: 11/21/21 1137     Special Requests NO SPECIAL REQUESTS        Culture No growth (<1,000 CFU/ML)       Blood Culture 1 [[id number]]  (Abnormal) Collected: 11/20/21 1212    Order Status: Completed Specimen: Blood Updated: 11/22/21 1009     Special Requests NO SPECIAL REQUESTS        Culture       STAPHYLOCOCCUS SPECIES, COAGULASE NEGATIVE GROWING IN 1 OF 1 BOTTLES DRAWN SITE=LHAND            (NOTE) [facility] CALLED TO KEEFER RN AT 0945 8.28.23 RB    Blood Culture 2 [[id number]] Collected: 11/20/21 1212    Order Status: Completed Specimen: Blood Updated: 11/25/21 1512     Special Requests NO SPECIAL REQUESTS        Culture NO GROWTH 5 DAYS       Culture, Blood, PCR ID Panel [[id number]]  (Abnormal) Collected: 11/20/21 1212    Order Status: Completed Specimen: Blood Updated: 11/21/21 1055     Accession Number [id number]     Enterococcus faecalis by PCR Not detected        Enterococcus faecium by PCR Not detected        Listeria monocytogenes by PCR Not detected        STAPHYLOCOCCUS Detected        Staphylococcus Aureus Not detected        Staphylococcus epidermidis by PCR Not detected        Staphylococcus lugdunensis by PCR Not detected        STREPTOCOCCUS Not detected        Streptococcus  agalactiae (Group B) Not detected        Strep pneumoniae Not detected        Strep pyogenes,(Grp. A) Not detected        Acinetobacter calcoac baumannii complex by PCR Not detected        Bacteroides fragilis by PCR Not detected        Enterobacteriaceae by PCR Not detected        Enterobacter cloacae complex by PCR Not detected        Escherichia Coli Not detected        Klebsiella aerogenes by PCR Not detected        Klebsiella oxytoca  by PCR Not detected        Klebsiella pneumoniae group by PCR Not detected        Proteus by PCR Not detected        Salmonella species by PCR Not detected        Serratia marcescens by PCR Not detected        Haemophilus Influenzae by PCR Not detected        Neisseria meningitidis by PCR Not detected        Pseudomonas aeruginosa Not detected        Stenotrophomonas maltophilia by PCR Not detected        Candida albicans by PCR Not detected        Candida auris by PCR Not detected        Candida glabrata Not detected        Candida krusei by PCR Not detected        Candida parapsilosis by PCR Not detected        Candida tropicalis by PCR Not detected        Cryptococcus neoformans/gattii by PCR Not detected        Resistant gene targets          [employer] test comment       False positive results may rarely occur. Correlate with clinical,epidemiologic, and other laboratory findings           Comment: Please see BCID Interpretation Guide in EPIC Links               Other pertinent lab: none    Total time spent with patient: 30 Minutes I personally reviewed chart, notes, data and current medications in the medical record.  I have personally examined and treated the patient at bedside during this period.                  Care Plan discussed with: Patient, Family, Care Manager, Nursing Staff, Consultant/Specialist, and >50% of time spent in counseling and coordination of care    Discussed:  Care Plan and D/C Planning    Prophylaxis:  H2B/PPI and Elquis    Disposition:    hospice           ___________________________________________________    Attending Physician: Gara Kroner, MD

## 2021-12-03 NOTE — Other (Signed)
12/03/21 0817   Vitals   Temp 97.3 F (36.3 C)   Temp Source Oral   Pulse (!) 112   Heart Rate Source Monitor   Respirations 18   BP 113/69   MAP (Calculated) 84   MAP (mmHg) 82   BP Location Left upper arm   BP Upper/Lower Upper   BP Method Automatic   Patient Position Sitting   Level of Consciousness 0   MEWS Score 3   Oxygen Therapy   SpO2 95 %   O2 Device None (Room air)     Vss. Pt to tx to hospice today

## 2021-12-04 LAB — EKG 12-LEAD
Atrial Rate: 123 {beats}/min
P Axis: 47 degrees
P-R Interval: 126 ms
Q-T Interval: 354 ms
QRS Duration: 114 ms
QTc Calculation (Bazett): 506 ms
R Axis: 133 degrees
T Axis: 5 degrees
Ventricular Rate: 123 {beats}/min

## 2021-12-04 MED FILL — PREDNISONE 10 MG PO TABS: 10 MG | ORAL | Qty: 1

## 2021-12-04 MED FILL — ELIQUIS 5 MG PO TABS: 5 MG | ORAL | Qty: 1

## 2021-12-04 MED FILL — CITALOPRAM HYDROBROMIDE 20 MG PO TABS: 20 MG | ORAL | Qty: 1

## 2021-12-04 MED FILL — MIDODRINE HCL 5 MG PO TABS: 5 MG | ORAL | Qty: 1

## 2021-12-04 MED FILL — MIRALAX 17 G PO PACK: 17 g | ORAL | Qty: 1

## 2021-12-04 MED FILL — METOCLOPRAMIDE HCL 10 MG PO TABS: 10 MG | ORAL | Qty: 1

## 2021-12-04 NOTE — Progress Notes (Addendum)
1709hours tachy therapies switched off from ICD by [employer].

## 2021-12-04 NOTE — Progress Notes (Signed)
Bedside shift change report given to Hollis RN  (oncoming nurse) by Zenaida Niece  (offgoing nurse). Report included the following information Nurse Handoff Report, Intake/Output, MAR, and Recent Results.

## 2021-12-04 NOTE — Progress Notes (Addendum)
[facility]    Progress Note    NAME: Ariel Day   DOB:  02/16/1946  MRM:  [id number]    Date/Time of service 12/04/2021  7:19 AM    To assist coordination of care and communication with nursing and staff, this note may be preliminary early in the day, but finalized by end of the day.        Assessment and Plan:     Acute metabolic encephalopathy - POA due to Ca and other terminal issues. Now stable but poor.  Family asks to stop venlafaxine.  End stage.  Palliative consulted.  Hospice is a reasonable option, and the family has agreed to this.  Hospice info session set up. Case management to assist. Awaiting final plan.    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  Debility prevents any further treatment. Capecitabine on hold. Patient to enter hospice.    Acute deep vein thrombosis (DVT) of femoral vein of left lower extremity - POA, new, extensive.  Vascular surgery consulted but no intervention. Now on eliquis, for comfort to avoid DVT pain    Malignant pleural effusion - POA and she has twice had therapeutic thoracentesis in last week.  Would like pleurex prior to discharge, for hospice palliative care.  Cannot be done in hospital.  IR recommends family call [phone] when re accumulated and the will place pleurex outpatient    Hypercalcemia / Hyponatremia / AKI (acute kidney injury) / Dehydration / Elevated lactic acid level - Ca max 13.4 on admit. Due to mets. Got IV zometa.  Ca now better. Lactic acid in part due to liver mets.  Hydrating.  Na worsening. On hospice, stop checking    Debility / Neoplastic malignant related fatigue / Anorexia / Hypoalbuminemia - POA, severe, end stage.  She is not tolerating supplements. No reasonable hope of physical improvement. Hospice is a reasonable option      Orthostatic hypotension - Typically on midodrine, but not an issue on hospice      SIRS criteria /  Leukocytosis / Sinus tachycardia - UA mostly normal and Ur cx negative. Blood cx with only CNSA contaminant. Steroids may have elevated WBC. She has an AICD which I have asked cardiology to deactivate for hospice.    Anxiety and depression - stop venlafaxine, start celexa and continue remeron    GI bleed - Hemoccult positive on admit.  PPI         Subjective:     Chief Complaint:  stable, DC to hospice today    ROS:  (bold if positive, if negative)    SOB/DOETolerating minimal PT Tolerating minimal Diet        Objective:     Last 24hrs VS reviewed since prior progress note. Most recent are:    Vitals:    12/03/21 1645 12/03/21 1925 12/03/21 2355 12/04/21 0557   BP: 112/66 114/72 118/70 125/73   Pulse: 88 (!) 110 (!) 116 (!) 115   Resp: '18 16 18 20   '$ Temp: 97.2 F (36.2 C) 97.5 F (36.4 C) 97.9 F (36.6 C)    TempSrc:   Oral Oral   SpO2: 97% 96% 94% 97%   Weight:       Height:          '@lastspo2withflo'$ @       Intake/Output Summary (Last 24 hours) at 12/04/2021 0719  Last data filed at  12/03/2021 1124  Gross per 24 hour   Intake 120 ml   Output 100 ml   Net 20 ml          Physical Exam:    Gen: Frail, ill appearing, in no acute distress  HEENT:  Pink conjunctivae, PERRL, hearing barely intact to voice, moist mucous membranes  Neck:  Supple, without masses, thyroid non-tender  Resp:  No accessory muscle use, clear breath sounds without wheezes rales or rhonchi  Card:  No murmurs, tachycardic S1, S2 without thrills, bruits or peripheral edema  Abd:  Soft, non-tender, non-distended, normoactive bowel sounds are present, no mass  Lymph:  No cervical or inguinal adenopathy  Musc:  No cyanosis or clubbing  Skin:  No rashes or ulcers, skin turgor is good  Neuro:  Cranial nerves are grossly intact, general motor weakness, does not follows commands appropriately  Psych:  Poor insight, not oriented    Telemetry reviewed:   normal sinus rhythm  __________________________________________________________________  Medications  Reviewed: (see below)  Medications:     Current Facility-Administered Medications   Medication Dose Route Frequency    citalopram (CELEXA) tablet 10 mg  10 mg Oral Daily    metoclopramide (REGLAN) tablet 5 mg  5 mg Oral TID AC    pantoprazole (PROTONIX) tablet 40 mg  40 mg Oral QAM AC    polyethylene glycol (GLYCOLAX) packet 17 g  17 g Oral TID    sennosides-docusate sodium (SENOKOT-S) 8.6-50 MG tablet 2 tablet  2 tablet Oral BID    bisacodyl (DULCOLAX) suppository 10 mg  10 mg Rectal Daily PRN    mirtazapine (REMERON) tablet 7.5 mg  7.5 mg Oral Nightly    prochlorperazine (COMPAZINE) suppository 25 mg  25 mg Rectal Q12H PRN    prochlorperazine (COMPAZINE) tablet 5 mg  5 mg Oral Q6H PRN    apixaban (ELIQUIS) tablet 5 mg  5 mg Oral BID    midodrine (PROAMATINE) tablet 2.5 mg  2.5 mg Oral BID    sodium chloride flush 0.9 % injection 5-40 mL  5-40 mL IntraVENous 2 times per day    sodium chloride flush 0.9 % injection 5-40 mL  5-40 mL IntraVENous PRN    0.9 % sodium chloride infusion   IntraVENous PRN    ondansetron (ZOFRAN-ODT) disintegrating tablet 4 mg  4 mg Oral Q8H PRN    Or    ondansetron (ZOFRAN) injection 4 mg  4 mg IntraVENous Q6H PRN    polyethylene glycol (GLYCOLAX) packet 17 g  17 g Oral Daily PRN    acetaminophen (TYLENOL) tablet 650 mg  650 mg Oral Q6H PRN    Or    acetaminophen (TYLENOL) suppository 650 mg  650 mg Rectal Q6H PRN    predniSONE (DELTASONE) tablet 10 mg  10 mg Oral Daily        Lab Data Reviewed: (see below)  Lab Review:     No results for input(s): "WBC", "HGB", "HCT", "PLT" in the last 72 hours.    Recent Labs     12/01/21  1605   NA 128*   K 4.4   CL 92*   CO2 30   GLUCOSE 168*   BUN 54*   CREATININE 1.42*   CALCIUM 9.7       Lab Results   Component Value Date/Time    GLUCOSE 168 12/01/2021 04:05 PM    GLUCOSE 148 11/30/2021 11:06 PM    GLUCOSE 93 11/30/2021 06:33 AM    GLUCOSE 86  11/29/2021 07:27 AM    GLUCOSE 112 11/28/2021 10:21 AM     No results for input(s): "PH", "PCO2", "PO2",  "HCO3", "FIO2" in the last 72 hours.  No results for input(s): "INR" in the last 72 hours.  Results       Procedure Component Value Units Date/Time    Culture, Blood 2 [[id number]] Collected: 12/01/21 0500    Order Status: Completed Specimen: Blood Updated: 12/03/21 0850     Special Requests NO SPECIAL REQUESTS        Culture NO GROWTH 2 DAYS       Culture, Blood 1 [[id number]] Collected: 11/30/21 2303    Order Status: Completed Specimen: Blood Updated: 12/03/21 2355     Special Requests NO SPECIAL REQUESTS        Culture NO GROWTH 3 DAYS       Culture, Urine [[id number]]     Order Status: Canceled Specimen: Urine, clean catch     Blood Culture 2 [[id number]]     Order Status: Canceled Specimen: Blood     Blood Culture 1 [[id number]]     Order Status: Canceled Specimen: Blood     Culture, Urine [[id number]] Collected: 11/20/21 1213    Order Status: Completed Specimen: Urine, clean catch Updated: 11/21/21 1137     Special Requests NO SPECIAL REQUESTS        Culture No growth (<1,000 CFU/ML)       Blood Culture 1 [[id number]]  (Abnormal) Collected: 11/20/21 1212    Order Status: Completed Specimen: Blood Updated: 11/22/21 1009     Special Requests NO SPECIAL REQUESTS        Culture       STAPHYLOCOCCUS SPECIES, COAGULASE NEGATIVE GROWING IN 1 OF 1 BOTTLES DRAWN SITE=LHAND            (NOTE) [facility] CALLED TO KEEFER RN AT 0945 8.28.23 RB    Blood Culture 2 [[id number]] Collected: 11/20/21 1212    Order Status: Completed Specimen: Blood Updated: 11/25/21 1512     Special Requests NO SPECIAL REQUESTS        Culture NO GROWTH 5 DAYS       Culture, Blood, PCR ID Panel [[id number]]  (Abnormal) Collected: 11/20/21 1212    Order Status: Completed Specimen: Blood Updated: 11/21/21 1055     Accession Number [id number]     Enterococcus faecalis by PCR Not detected        Enterococcus faecium by PCR Not detected        Listeria monocytogenes by PCR Not detected        STAPHYLOCOCCUS Detected        Staphylococcus Aureus Not  detected        Staphylococcus epidermidis by PCR Not detected        Staphylococcus lugdunensis by PCR Not detected        STREPTOCOCCUS Not detected        Streptococcus agalactiae (Group B) Not detected        Strep pneumoniae Not detected        Strep pyogenes,(Grp. A) Not detected        Acinetobacter calcoac baumannii complex by PCR Not detected        Bacteroides fragilis by PCR Not detected        Enterobacteriaceae by PCR Not detected        Enterobacter cloacae complex by PCR Not detected        Escherichia Coli Not detected  Klebsiella aerogenes by PCR Not detected        Klebsiella oxytoca by PCR Not detected        Klebsiella pneumoniae group by PCR Not detected        Proteus by PCR Not detected        Salmonella species by PCR Not detected        Serratia marcescens by PCR Not detected        Haemophilus Influenzae by PCR Not detected        Neisseria meningitidis by PCR Not detected        Pseudomonas aeruginosa Not detected        Stenotrophomonas maltophilia by PCR Not detected        Candida albicans by PCR Not detected        Candida auris by PCR Not detected        Candida glabrata Not detected        Candida krusei by PCR Not detected        Candida parapsilosis by PCR Not detected        Candida tropicalis by PCR Not detected        Cryptococcus neoformans/gattii by PCR Not detected        Resistant gene targets          [employer] test comment       False positive results may rarely occur. Correlate with clinical,epidemiologic, and other laboratory findings           Comment: Please see BCID Interpretation Guide in EPIC Links               Other pertinent lab: none    Total time spent with patient: 30 Minutes I personally reviewed chart, notes, data and current medications in the medical record.  I have personally examined and treated the patient at bedside during this period.                  Care Plan discussed with: Patient, Family, Care Manager, Nursing Staff, Consultant/Specialist,  and >50% of time spent in counseling and coordination of care    Discussed:  Care Plan and D/C Planning    Prophylaxis:  H2B/PPI and Elquis    Disposition:   hospice           ___________________________________________________    Attending Physician: Gara Kroner, MD

## 2021-12-04 NOTE — Other (Signed)
12/04/21 0800   Vital Signs   Temp 97.3 F (36.3 C)   Temp Source Oral   Pulse (!) 113   Heart Rate Source Monitor   Respirations 18   BP 104/76   MAP (Calculated) 85   BP Location Left upper arm   BP Method Automatic   Patient Position Supine   Level of Consciousness 0   MEWS Score 3   Oxygen Therapy   SpO2 98 %   O2 Device None (Room air)     Pt going to hospice

## 2021-12-04 NOTE — Care Coordination-Inpatient (Signed)
CM received call asking to arrange transport for discharge tomorrow.  Pt is being discharged home with hospice.  Confirmed pt is going to [address], [place] [address].  Sent referral through [facility] to [facility] for 1pm pickup on Monday 12/05/21.  Melody Comas, BSW, ACM

## 2021-12-04 NOTE — Progress Notes (Addendum)
[facility]  [phone]     Patient Name: Ariel Day  Date of Birth: 21-Mar-1946  Age: 76 y.o.    [place] RN Note:  Hospice liaison contacted spouse who verified [employer] placed. AICD. Order placed to have it de-activated and [employer] has been contacted and will come today.  Celexa added to medications being filled at [facility] for family to pick up tomorrow.     Thank you for the opportunity to be of service to this patient.     Huel Coventry RN  Clinical Nurse Liaison  [facility]  [phone] Mobile  [phone] Office   Available on Perfect Serve

## 2021-12-05 LAB — CBC
Hematocrit: 35.4 % (ref 35.0–47.0)
Hemoglobin: 11.2 g/dL — ABNORMAL LOW (ref 11.5–16.0)
MCH: 31.8 PG (ref 26.0–34.0)
MCHC: 31.6 g/dL (ref 30.0–36.5)
MCV: 100.6 FL — ABNORMAL HIGH (ref 80.0–99.0)
MPV: 10.6 FL (ref 8.9–12.9)
Nucleated RBCs: 0 PER 100 WBC
Platelets: 207 10*3/uL (ref 150–400)
RBC: 3.52 M/uL — ABNORMAL LOW (ref 3.80–5.20)
RDW: 16.6 % — ABNORMAL HIGH (ref 11.5–14.5)
WBC: 16.3 10*3/uL — ABNORMAL HIGH (ref 3.6–11.0)
nRBC: 0 10*3/uL (ref 0.00–0.01)

## 2021-12-05 MED FILL — PROCHLORPERAZINE MALEATE 10MG TABS: 10 10 MG | ORAL | 3 days supply | Qty: 12 | Fill #0 | Status: AC

## 2021-12-05 MED FILL — BISACODYL 10MG SUPP: 10 10 MG | 3 days supply | Qty: 3 | Fill #0 | Status: AC

## 2021-12-05 MED FILL — ONDANSETRON 4MG TBDP: 4 4 MG | ORAL | 2 days supply | Qty: 14 | Fill #0 | Status: AC

## 2021-12-05 MED FILL — HYDROMORPHONE HCL 1MG/ML LIQD: 1 1 MG/ML | ORAL | 5 days supply | Qty: 30 | Fill #0 | Status: AC

## 2021-12-05 MED FILL — ACETAMINOPHEN 650MG SUPP: 650 650 MG | RECTAL | 1 days supply | Qty: 6 | Fill #0 | Status: AC

## 2021-12-05 MED FILL — LORAZEPAM 2MG/ML CONC: 2 2 MG/ML | 20 days supply | Qty: 30 | Fill #0 | Status: AC

## 2021-12-05 MED FILL — HYOSCYAMINE SULFATE 0.125MG SUBL: 0.125 0.125 MG | SUBLINGUAL | 2 days supply | Qty: 12 | Fill #0 | Status: AC

## 2021-12-05 MED FILL — HALOPERIDOL LACTATE 2MG/ML CONC: 2 2 MG/ML | ORAL | 15 days supply | Qty: 30 | Fill #0 | Status: AC

## 2021-12-05 MED FILL — METOCLOPRAMIDE HCL 10 MG PO TABS: 10 MG | ORAL | Qty: 1

## 2021-12-05 MED FILL — PREDNISONE 10 MG PO TABS: 10 MG | ORAL | Qty: 1

## 2021-12-05 MED FILL — MIRALAX 17 G PO PACK: 17 g | ORAL | Qty: 1

## 2021-12-05 MED FILL — STIMULANT LAXATIVE 8.6-50 MG PO TABS: ORAL | Qty: 2

## 2021-12-05 MED FILL — ELIQUIS 5 MG PO TABS: 5 MG | ORAL | Qty: 1

## 2021-12-05 MED FILL — ONDANSETRON HCL 4 MG/2ML IJ SOLN: 4 MG/2ML | INTRAMUSCULAR | Qty: 2

## 2021-12-05 MED FILL — CITALOPRAM HYDROBROMIDE 20 MG PO TABS: 20 MG | ORAL | Qty: 1

## 2021-12-05 MED FILL — MIRTAZAPINE 15 MG PO TABS: 15 MG | ORAL | Qty: 1

## 2021-12-05 MED FILL — MIDODRINE HCL 5 MG PO TABS: 5 MG | ORAL | Qty: 1

## 2021-12-05 MED FILL — PANTOPRAZOLE SODIUM 40 MG PO TBEC: 40 MG | ORAL | Qty: 1

## 2021-12-05 NOTE — Plan of Care (Signed)
Problem: Discharge Planning  Goal: Discharge to home or other facility with appropriate resources  Outcome: Completed  Flowsheets (Taken 12/05/2021 0841)  Discharge to home or other facility with appropriate resources:   Arrange for needed discharge resources and transportation as appropriate   Identify barriers to discharge with patient and caregiver     Problem: Safety - Adult  Goal: Free from fall injury  Outcome: Completed  Flowsheets (Taken 12/05/2021 0841)  Free From Fall Injury: Instruct family/caregiver on patient safety     Problem: Pain  Goal: Verbalizes/displays adequate comfort level or baseline comfort level  Outcome: Completed     Problem: Skin/Tissue Integrity  Goal: Absence of new skin breakdown  Description: 1.  Monitor for areas of redness and/or skin breakdown  2.  Assess vascular access sites hourly  3.  Every 4-6 hours minimum:  Change oxygen saturation probe site  4.  Every 4-6 hours:  If on nasal continuous positive airway pressure, respiratory therapy assess nares and determine need for appliance change or resting period.  Outcome: Completed     Problem: ABCDS Injury Assessment  Goal: Absence of physical injury  Outcome: Completed  Flowsheets (Taken 12/05/2021 0841)  Absence of Physical Injury: Implement safety measures based on patient assessment     Problem: Nutrition Deficit:  Goal: Optimize nutritional status  Outcome: Completed

## 2021-12-05 NOTE — Progress Notes (Signed)
Attempted to schedule hospital follow up PCP appointment. Office scheduler/Nurse will contact the patient with appointment information. Pending patient discharge. [place] Dost, Care Management Assistant

## 2021-12-05 NOTE — Progress Notes (Signed)
Physician Progress Note      PATIENT:               Ariel Day, Ariel Day  CSN #:                  [id number]  DOB:                       1945-11-12  ADMIT DATE:       11/20/2021 11:52 AM  DISCH DATE:  RESPONDING  PROVIDER #:        Rulon Abide MD          QUERY TEXT:    Dear attending,    Pt noted to have elevated WBC, elevated neutrophils and documentation of   metabolic encephalopathy and acute cystitis. On 9/1-per query response- This   patient has sepsis which was present on admission due to cystitis.    However, as of progress notes of 9/7- SIRS criteria / Leukocytosis / Sinus   tachycardia - UA mostly normal and Ur cx negative. Blood cx with only CNSA   contaminant. Steroids may have elevated WBC      If possible, please document in the progress notes and discharge summary if   you are evaluating and /or treating any of the following:    The medical record reflects the following:  Risk Factors: hx. mets cancer    Clinical Indicators: 8/27-WBC 20.3, neutrophils 84, lactic acid level 1.7, UA   + 1 bacteria, urine cx-no growth  Per H&P- AMS / Metabolic Encephalopathy  likely multifactorial in setting of hypercalcemia and uti  8/30-per PN- Acute cystitis:  Started on cefepime on admission;  Follow urine culture results;  Blood culture 8/27 1/2 CNS likely contamination  Will give 5 days of tx, dc abx on 8/31  8/31-per Oncology-Acute cystitis: antibiotics per medicine  Staph in 1 of 2 blood cultures: likely contaminant  9/1-Per PN-Query response-PROVIDER RESPONSE TEXT:  This patient has sepsis which was present on admission due to cystitis.  9/7-Per PN-SIRS criteria / Leukocytosis / Sinus tachycardia - UA mostly normal   and Ur cx negative. Blood cx with only CNSA contaminant. Steroids may have   elevated WBC    Treatment: IV Cefepime finished on 8/31,  Monitor vital signs, labwork,   imaging      Thank you,    Amy Roesner RN, BSN, CRCR, CCDS  Clinical Documentation Improvement  [phone] or via Perfect  Serve  Options provided:  -- SIRS of non-infectious origin, please specify cause  -- Sepsis, present on admission due to cystitis  -- no sepsis, POA  -- Other - I will add my own diagnosis  -- Disagree - Not applicable / Not valid  -- Disagree - Clinically unable to determine / Unknown  -- Refer to Clinical Documentation Reviewer    PROVIDER RESPONSE TEXT:    This patient does not have sepsis, POA    Query created by: Laurette Schimke on 12/05/2021 11:52 AM      Electronically signed by:  Rulon Abide MD 12/05/2021 11:54 AM

## 2021-12-05 NOTE — Progress Notes (Signed)
Bedside shift change report given to [place]  (oncoming nurse) by Ernst Spell (offgoing nurse). Report included the following information Nurse Handoff Report, Intake/Output, MAR, and Recent Results.

## 2021-12-05 NOTE — Progress Notes (Signed)
[facility] here to pick up patient. Patient is to be picked up at 1pm. Family rejecting ambulance ride at this time as they were prepared for 1pm.

## 2021-12-05 NOTE — Progress Notes (Signed)
[facility]    Progress Note    NAME: Ariel Day   DOB:  [birth year]  MRM:  [id number]    Date/Time of service 12/05/2021  8:51 AM    To assist coordination of care and communication with nursing and staff, this note may be preliminary early in the day, but finalized by end of the day.        Assessment and Plan:     Acute metabolic encephalopathy - POA due to Ca and other terminal issues. Now stable but poor.  Family asks to stop venlafaxine.  End stage.  Palliative consulted.  Hospice is a reasonable option, and the family has agreed to this.  Hospice info session set up. Case management to assist.  Will go home today.    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  Debility prevents any further treatment. Capecitabine on hold. Patient to enter hospice.    Acute deep vein thrombosis (DVT) of femoral vein of left lower extremity - POA, new, extensive.  Vascular surgery consulted but no intervention. Now on eliquis, for comfort to avoid DVT pain    Malignant pleural effusion - POA and she has twice had therapeutic thoracentesis in last week.  Would like pleurex prior to discharge, for hospice palliative care.  Cannot be done in hospital.  IR recommends family call [phone] when re accumulated and the will place pleurex outpatient    Hypercalcemia / Hyponatremia / AKI (acute kidney injury) / Dehydration / Elevated lactic acid level - Ca max 13.4 on admit. Due to mets. Got IV zometa.  Ca now better. Lactic acid in part due to liver mets.  Hydrating.  Na worsening. On hospice, stop checking    Debility / Neoplastic malignant related fatigue / Anorexia / Hypoalbuminemia - POA, severe, end stage.  She is not tolerating supplements. No reasonable hope of physical improvement. Hospice is a reasonable option      Orthostatic hypotension - Typically on midodrine, but not an issue on hospice      SIRS criteria /  Leukocytosis / Sinus tachycardia - UA mostly normal and Ur cx negative. Blood cx with only CNSA contaminant. Steroids may have elevated WBC. She has an AICD which I have asked cardiology to deactivate for hospice.    Anxiety and depression - stop venlafaxine, start celexa and continue remeron    GI bleed - Hemoccult positive on admit.  PPI         Subjective:     Chief Complaint:  stable, DC to hospice today    ROS:  (bold if positive, if negative)    SOB/DOETolerating minimal PT Tolerating minimal Diet        Objective:     Last 24hrs VS reviewed since prior progress note. Most recent are:    Vitals:    12/04/21 2103 12/05/21 0000 12/05/21 0407 12/05/21 0755   BP: 120/69 122/76 122/84 100/74   Pulse: (!) 111 (!) 115 (!) 110 (!) 113   Resp: '20 20 20 20   '$ Temp: 97.9 F (36.6 C) 97.9 F (36.6 C) 97.3 F (36.3 C) 98.4 F (36.9 C)   TempSrc: Oral Axillary Oral Axillary   SpO2: 96% 96% 97% 95%   Weight:       Height:          '@lastspo2withflo'$ @       Intake/Output Summary (Last 24 hours) at 12/05/2021  8119  Last data filed at 12/04/2021 1523  Gross per 24 hour   Intake 60 ml   Output 200 ml   Net -140 ml          Physical Exam:    Gen: Frail, ill appearing, in no acute distress  HEENT:  Pink conjunctivae, PERRL, hearing barely intact to voice, moist mucous membranes  Neck:  Supple, without masses, thyroid non-tender  Resp:  No accessory muscle use, clear breath sounds without wheezes rales or rhonchi  Card:  No murmurs, tachycardic S1, S2 without thrills, bruits or peripheral edema  Abd:  Soft, non-tender, non-distended, normoactive bowel sounds are present, no mass  Lymph:  No cervical or inguinal adenopathy  Musc:  No cyanosis or clubbing  Skin:  No rashes or ulcers, skin turgor is good  Neuro:  Cranial nerves are grossly intact, general motor weakness, does not follows commands appropriately  Psych:  Poor insight, not oriented    Telemetry reviewed:   normal sinus  rhythm  __________________________________________________________________  Medications Reviewed: (see below)  Medications:     Current Facility-Administered Medications   Medication Dose Route Frequency    citalopram (CELEXA) tablet 10 mg  10 mg Oral Daily    metoclopramide (REGLAN) tablet 5 mg  5 mg Oral TID AC    pantoprazole (PROTONIX) tablet 40 mg  40 mg Oral QAM AC    polyethylene glycol (GLYCOLAX) packet 17 g  17 g Oral TID    sennosides-docusate sodium (SENOKOT-S) 8.6-50 MG tablet 2 tablet  2 tablet Oral BID    bisacodyl (DULCOLAX) suppository 10 mg  10 mg Rectal Daily PRN    mirtazapine (REMERON) tablet 7.5 mg  7.5 mg Oral Nightly    prochlorperazine (COMPAZINE) suppository 25 mg  25 mg Rectal Q12H PRN    prochlorperazine (COMPAZINE) tablet 5 mg  5 mg Oral Q6H PRN    apixaban (ELIQUIS) tablet 5 mg  5 mg Oral BID    midodrine (PROAMATINE) tablet 2.5 mg  2.5 mg Oral BID    sodium chloride flush 0.9 % injection 5-40 mL  5-40 mL IntraVENous 2 times per day    sodium chloride flush 0.9 % injection 5-40 mL  5-40 mL IntraVENous PRN    0.9 % sodium chloride infusion   IntraVENous PRN    ondansetron (ZOFRAN-ODT) disintegrating tablet 4 mg  4 mg Oral Q8H PRN    Or    ondansetron (ZOFRAN) injection 4 mg  4 mg IntraVENous Q6H PRN    polyethylene glycol (GLYCOLAX) packet 17 g  17 g Oral Daily PRN    acetaminophen (TYLENOL) tablet 650 mg  650 mg Oral Q6H PRN    Or    acetaminophen (TYLENOL) suppository 650 mg  650 mg Rectal Q6H PRN    predniSONE (DELTASONE) tablet 10 mg  10 mg Oral Daily        Lab Data Reviewed: (see below)  Lab Review:     No results for input(s): "WBC", "HGB", "HCT", "PLT" in the last 72 hours.    No results for input(s): "NA", "K", "CL", "CO2", "GLUCOSE", "BUN", "CREATININE", "CALCIUM", "MG", "PHOS", "LABALBU", "AST", "ALT" in the last 72 hours.    Invalid input(s): "TBIL"    Lab Results   Component Value Date/Time    GLUCOSE 168 12/01/2021 04:05 PM    GLUCOSE 148 11/30/2021 11:06 PM    GLUCOSE 93  11/30/2021 06:33 AM    GLUCOSE 86 11/29/2021 07:27 AM    GLUCOSE 112 11/28/2021  10:21 AM     No results for input(s): "PH", "PCO2", "PO2", "HCO3", "FIO2" in the last 72 hours.  No results for input(s): "INR" in the last 72 hours.  Results       Procedure Component Value Units Date/Time    Culture, Blood 2 [[id number]] Collected: 12/01/21 0500    Order Status: Completed Specimen: Blood Updated: 12/05/21 0850     Special Requests NO SPECIAL REQUESTS        Culture NO GROWTH 4 DAYS       Culture, Blood 1 [[id number]] Collected: 11/30/21 2303    Order Status: Completed Specimen: Blood Updated: 12/04/21 2355     Special Requests NO SPECIAL REQUESTS        Culture NO GROWTH 4 DAYS               Other pertinent lab: none    Total time spent with patient: 30 Minutes I personally reviewed chart, notes, data and current medications in the medical record.  I have personally examined and treated the patient at bedside during this period.                  Care Plan discussed with: Patient, Family, Care Manager, Nursing Staff, Consultant/Specialist, and >50% of time spent in counseling and coordination of care    Discussed:  Care Plan and D/C Planning    Prophylaxis:  H2B/PPI and Elquis    Disposition:   hospice           ___________________________________________________    Attending Physician: Gara Kroner, MD

## 2021-12-05 NOTE — Other (Signed)
12/05/21 0755   Vital Signs   Temp 98.4 F (36.9 C)   Temp Source Axillary   Pulse (!) 113   Heart Rate Source Monitor   Respirations 20   BP 100/74   MAP (Calculated) 83   BP Location Left upper arm   BP Method Automatic   Patient Position Supine   Level of Consciousness 0   MEWS Score 4   Oxygen Therapy   SpO2 95 %   O2 Device None (Room air)     Home with hospice today at 1pm

## 2021-12-05 NOTE — Progress Notes (Signed)
Patient is transitioning to hospice care  Will sign off    Dianne Dun, MD  [facility]

## 2021-12-05 NOTE — Progress Notes (Signed)
[facility]  [phone]     Patient Name: Ariel Day  Date of Birth: [birth year]  Age: 76 y.o.    [facility] RN Note:  Hospice bedside assessment. Spouse at bedside and I spoke with dtr by phone. [facility] will return for scheduled 1pm pick up so that daughter can be present when she gets home.  DDNR on chart. Patient is stable for transport. Spouse taken down to pharmacy to get her hospice medications and supplies gathered.   Daughter expressed concern that she did not get a drain placed. At this time her abdomen is soft and MD does not feel she would have enough fluid to place a drain. I assured the daughter,Stephanie, that hospice will monitor her at home and if fluid is significant and causing symptoms we could arrange for outpatient drain placement.  She felt re-assured.   Hospice admission is scheduled for 3pm today.     Thank you for the opportunity to be of service to this patient.     Huel Coventry RN  Clinical Nurse Liaison  [facility]  [phone] Mobile  [phone] Office   Available on Perfect Serve

## 2021-12-05 NOTE — Hospice (Signed)
Ariel Day  05-16-1945  76     Principle Hospice Diagnosis: Metastatic Breast Cancer  Diagnoses RELATED to the terminal prognosis: Debility, DVT, Plural effusion.   Unrelated Diagnosis:     Date of Hospice Admission: 12/05/21  Hospice Attending Elected by Patient: Dr. Marlaine Hind  Primary Care Physician:      Admitting RN: [name]   Nurse CM: [name]  Social Worker: [name]: [name] DNR: Yes    Military Service:     [name] Home: TBD     Direct Observation:   On arrival patient in hospital bed with husband [name] and daughter [name] at the bedside.  Patient is lethargic, oriented to self and place , denies pain, shortness of breath and nausea.  Patient's husband reports she had an emesis episode prior to discharge. Per family appetite is poor and patient has had significant physical decline over the last months.  Patient has some friction injury to sacral area and non blanchable redness to right heel, reviewed pressure injury prevention with spouse and daughter. Patient also noted to have scattered bruising to bilateral arms.  Family requests foley placement, 16Fr. foley placed with tea color cloudy with sediment draining. Patient currently on room air but oxygen in place for PRN use.  Reviewed medications with spouse and daughter, who want patient to continue to take Citalopram which was to be stopped.  Family voiced understanding of all information reviewed.  Encouraged to call hospice with any questions or concerns.      Palliative Performance Scale: 30%  ER Visits/ Hospitalizations in past year: 3  Onset Date of Hospice Diagnosis: 2017    Summary of Disease Progression Leading to Hospice Diagnosis:  Excerpted from [name]'s note on 11/30/21    Ariel Day is a 76 y.o. with a past history of metastatic breast cancer (dx 2017 w bone mets), long hx of treatments followed by Dr. Stephens November [facility] (current tx capecitabine), hx liver/ bone/ malignant pleural effusion [facility]  10/07/2021 temporary pleural drain), Hx chronic diastolic CHF s/p ICD 1027 EF 10% in past, improved to 65% in 2022 , adrenal insufficiency, orthostatic hypotension , who was admitted on 11/20/2021 from home with a diagnosis of weakness, fatigue, no longer able to walk, AMS.      Current medical issues leading to Palliative Medicine involvement include: patient being treated for hypercalcemia of malignancy (corrected calcium 13.5 on admission) UTI and also acute LLE DVT extending into Inferior Vena Cava  CT Abd: 11/20/2021   1.  Large filling defect extending from the left femoral vein to the inferior  portion of the inferior vena cava concerning for deep venous thrombosis.  2.  Multiple hepatic masses are again noted compatible with hepatic metastatic  disease   3.  Diffuse skeletal metastases are again visualized  4.  Small volume of ascites.     ECHO 8/27 : EF 65-70%     Social: married to spouse [name] for over 5 years.  They have 3 adult children (Dtr, [name], local, dtr [place], in [place] (family practice MD) and son, [name] in [place]) 6 grandchildren in [place].   [place] [place] [facility] [place] to [place] to live near dtr. Currently in mobile home while inlaw suite under construction (ready this week they hope)  Patient and spouse have traveled extensively in the last 15 years, going to all 64 states in motor home. They have spent many winters in [place], [place].  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  Spouse notes that patient has had ongoing functional decline in past 6 months, needing more assistance with ADL care    Use LCD Guidelines and list features:   Cancer Diagnoses     ________  A. Disease with distant metastases at presentation OR    ___x_____  B. Progression from an earlier stage of disease to metastatic disease with either:                          ____x____  1. continued decline in spite of therapy                          ____x____  2. patient declines further disease directed  therapy.          SPIRITUAL/Social/Emotional/psych:     Dr. Marlaine Hind contacted, agrees to serve as attending provider for hospice and provided verbal certification of terminal illness with prognosis of 6 months or less life expectancy. Orders for hospice admission, medications and plan of treatment received. Medication reconciliation completed.        Currently this patient has:  Supplemental O2: PRN  Foley Catheter: 16 Fr.      AICD: Has ICD been deactivated?  Yes:__x___   No:_____   (If no, please identify ex Corporate investment banker; [place] etc)        MEDS:  I have reviewed the patient's medication list with MD and identified the following:  Nonformulary medications: n/a  Unrelated medications: n/a     IDT communication to include MD, SN, SW, [place] and support team.

## 2021-12-05 NOTE — Discharge Summary (Addendum)
Goals of Care/Treatment Preferences    The Palliative Medicine team was consulted as part of your/your loved one's care in the hospital. Our team is a supportive service; we strive to relieve suffering and improve quality of life.    We reviewed advance care planning information, which includes the following:    Primary Decision Maker: Ariel Day,Ariel Day - Spouse - [phone]  Patient's Healthcare Decision Maker is:: Named in [place] Directive: None      We reviewed / discussed your code status as:   Code Status: DNR     "Full Code" means perform CPR in the event of cardiac arrest.      "DNR" means do NOT perform CPR in the event of cardiac arrest.      "Partial Code" means you have specific preferences, please discuss with your healthcare team.      "No Order" means this issue was not addressed / resolved during your stay    You will discharge home with hospice care to focus on comfort and quality of life. We had conversations with your family while you were in the hospital and Code Status was discussed and recommendation for DNR.    Because of the importance of this information, we are providing you with a printed copy to share with other healthcare providers after this hospitalization is complete.

## 2021-12-05 NOTE — Care Coordination-Inpatient (Signed)
Transition of Care Plan:    RUR: 14% Low  Prior Level of Functioning: Required some assistance with ADLs-living in camper with husband on daughter's property  Disposition: [facility]  DME needed: NA  Transportation at discharge: [facility] ([place]) phone [phone], scheduled for 1pm, delayed to 2:15pm.   IM/IMM Medicare/Tricare letter given: 11/23/21  Caregiver Contact: Husband Blair Heys, [phone] at the bedside  Discharge Caregiver contacted prior to discharge? Husband Meaghann Choo, [phone] at the bedside  Care Conference needed? NA  Barriers to discharge: Transportation delay      CM received call from nurse stating that [facility] has delayed transport to 2:15. Patient has a hospice admission planned for 3pm. CM attempted [facility] but the earliest ETA available is 4pm. CM to monitor.     [place], [place]

## 2021-12-06 ENCOUNTER — Encounter: Admit: 2021-12-06 | Discharge: 2021-12-06 | Payer: MEDICARE | Primary: Family Medicine

## 2021-12-06 LAB — CULTURE, BLOOD 2: Culture: NO GROWTH

## 2021-12-06 LAB — CULTURE, BLOOD 1: Culture: NO GROWTH

## 2021-12-06 NOTE — Hospice (Deleted)
Patient's family voiced understanding of hospice philosophy and goals of care.

## 2021-12-06 NOTE — Hospice (Signed)
Patient rec'd in bed. Husband and daughter present  Patient is hypoxic on room air. Oxygen applied at 2 lpm nc. Oxygen recovery quickly  Appetite is poor.   Foley catheter done  Patient cleaned of incontinent stool.  Medications reviewed with family  Discussed how to reposition and change patient  Reminded to call [place] with any needs

## 2021-12-06 NOTE — Hospice (Signed)
LMSW arrived at the patient's home to complete an Initial Psychosocial Assessment visit for [facility]. The LMSW was greeted at the back door by the patient's DTR Colletta Stephens and was shown the side entrance to where the patient and Rush Landmark reside. The RNCM was finishing up her visit upon arrival. The patient is a 76 y/o Caucasian female with a BSHPC Dx. Malignant neoplasm of female breast, unspecified estrogen receptor status, unspecified laterality, unspecified site of breast. Patient was received sleeping on her bed. Patient will wake for moments and fall back to sleep. Patient was alert and oriented x 2. Rush Landmark was the POC. Rush Landmark stated that the patient's appetite was poor. The patient had a good night's sleep Monday night. The patient's mood is accepting of her prognosis and Hospice services. open Abbe [place] stated that he is coping one day at a time he is a little exhausted and needs a knee replacement but is waiting until the patient passes. Rush Landmark stated that [place] and the two children are coping appropriately. 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 patient has been married to bill for 55 years. They have three children together. Colletta Creola who lives in [place] is where the patient resides, [name] lives in [place], and [place] lives in [place]. The patient has one sister Santiago Glad who is estranged and lives in [place]. The patient is a [employer] and belongs to the [place]. When the patient passes there will be a ceremony up [place] and a ceremony dressing before the patient goes to [employer] [employer] which will be affinities. LMSW provided emotional support court of counseling related to end of life care.

## 2021-12-06 NOTE — Hospice (Deleted)
Ariel Day  12-Apr-1945  75     Principle Hospice Diagnosis: Metastatic Breast Cancer  Diagnoses RELATED to the terminal prognosis: Debility, DVT, Plural effusion.   Unrelated Diagnosis:     Date of Hospice Admission: 12/05/21  Hospice Attending Elected by Patient: Ariel Day  Primary Care Physician:      Admitting RN: [name]   Nurse CM: [name]  Social Worker: [name]: [name] DNR: Yes    Military Service:     [name] Home: TBD     Direct Observation:   On arrival patient in hospital bed with husband Ariel Day and daughter Ariel Day at the bedside.  Patient is lethargic, oriented to self and place , denies pain, shortness of breath and nausea.  Patient's husband reports she had an emesis episode prior to discharge. Per family appetite is poor and patient has had significant physical decline over the last months.  Patient has some friction injury to sacral area and non blanchable redness to right heel, reviewed pressure injury prevention with spouse and daughter. Patient also noted to have scattered bruising to bilateral arms.  Family requests foley placement, 16Fr. foley placed with tea color cloudy with sediment draining. Patient currently on room air but oxygen in place for PRN use.  Reviewed medications with spouse and daughter, who want patient to continue to take Citalopram which was to be stopped.  Family voiced understanding of all information reviewed.  Encouraged to call hospice with any questions or concerns.      Palliative Performance Scale: 30%  ER Visits/ Hospitalizations in past year: 3  Onset Date of Hospice Diagnosis: 2017    Summary of Disease Progression Leading to Hospice Diagnosis:  Excerpted from DR. Webb--Wright's note on 11/30/21    Ariel Day is a 76 y.o. with a past history of metastatic breast cancer (dx 2017 w bone mets), long hx of treatments followed by Dr. Stephens November at [facility] (current tx capecitabine), hx liver/ bone/ malignant pleural effusion [facility]  10/07/2021 temporary pleural drain), Hx chronic diastolic CHF s/p ICD 6962 EF 10% in past, improved to 65% in 2022 , adrenal insufficiency, orthostatic hypotension , who was admitted on 11/20/2021 from home with a diagnosis of weakness, fatigue, no longer able to walk, AMS.      Current medical issues leading to Palliative Medicine involvement include: patient being treated for hypercalcemia of malignancy (corrected calcium 13.5 on admission) UTI and also acute LLE DVT extending into Inferior Vena Cava  CT Abd: 11/20/2021   1.  Large filling defect extending from the left femoral vein to the inferior  portion of the inferior vena cava concerning for deep venous thrombosis.  2.  Multiple hepatic masses are again noted compatible with hepatic metastatic  disease   3.  Diffuse skeletal metastases are again visualized  4.  Small volume of ascites.     ECHO 8/27 : EF 65-70%     Social: married to spouse Ariel Day for over 8 years.  They have 3 adult children (Dtr, [place], local, dtr [place], in [place] (family practice MD) and son, [name] in [place]) 6 grandchildren in [place].   Recent move from [facility] to [place] to live near dtr. Currently in mobile home while inlaw suite under construction (ready this week they hope)  Patient and spouse have traveled extensively in the last 15 years, going to all 16 states in motor home. They have spent many winters in [place], [place].  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  Spouse notes that patient has had ongoing functional decline in past 6 months, needing more assistance with ADL care    Use LCD Guidelines and list features:   Cancer Diagnoses     ________  A. Disease with distant metastases at presentation OR    ___x_____  B. Progression from an earlier stage of disease to metastatic disease with either:                          ____x____  1. continued decline in spite of therapy                          ____x____  2. patient declines further disease directed  therapy.          SPIRITUAL/Social/Emotional/psych:     Ariel Day contacted, agrees to serve as attending provider for hospice and provided verbal certification of terminal illness with prognosis of 6 months or less life expectancy. Orders for hospice admission, medications and plan of treatment received. Medication reconciliation completed.        Currently this patient has:  Supplemental O2: PRN  Foley Catheter: 16 Fr.      AICD: Has ICD been deactivated?  Yes:__x___   No:_____   (If no, please identify ex Corporate investment banker; [place] etc)        MEDS:  I have reviewed the patient's medication list with MD and identified the following:  Nonformulary medications: n/a  Unrelated medications: n/a     IDT communication to include MD, SN, SW, [place] and support team.

## 2021-12-07 NOTE — Hospice (Signed)
Late Entry For 12-05-2021   Hospice by Ariel Ewings, RN at 12/05/2021  3:17 PM Version 1 of 1      Author: Donalda Ewings, RN Specialty: -- Author Type: Registered Nurse   Filed: 12/06/2021  9:20 AM Date of Service: 12/05/2021  3:17 PM Status: Signed   Editor: Ariel Ewings, RN (Registered Nurse)     Ariel Day    07-07-1945    75      Principle Hospice Diagnosis: Metastatic Breast Cancer    Diagnoses RELATED to the terminal prognosis: Debility, DVT, Plural effusion.     Unrelated Diagnosis:     Date of Hospice Admission: 12/05/21    Hospice Attending Elected by Patient: Ariel Day    Primary Care Physician:     Admitting RN: Ariel Day     Nurse CM: Ariel Day    Social Worker: Ariel Day: Ariel Day DNR: Yes     Military Service:     Ariel Day Home: TBD     Direct Observation:     On arrival patient in hospital bed with husband Ariel Day and daughter Ariel Day at the bedside.  Patient is lethargic, oriented to self and place , denies pain, shortness of breath and nausea.  Patient's husband reports she had an emesis episode prior to discharge. Per family appetite is poor and patient has had significant physical decline over the last months.  Patient has some friction injury to sacral area and non blanchable redness to right heel, reviewed pressure injury prevention with spouse and daughter. Patient also noted to have scattered bruising to bilateral arms.  Family requests foley placement, 16Fr. foley placed with tea color cloudy with sediment draining. Patient currently on room air but oxygen in place for PRN use.  Reviewed medications with spouse and daughter, who want patient to continue to take Citalopram which was to be stopped.  Family voiced understanding of all information reviewed.  Encouraged to call hospice with any questions or concerns.    Palliative Performance Scale: 30%    ER Visits/ Hospitalizations in past year: 3    Onset Date of Hospice Diagnosis: 2017    Summary of Disease Progression Leading to  Hospice Diagnosis:    Excerpted from [name]'s note on 11/30/21      Ariel Day is a 76 y.o. with a past history of metastatic breast cancer (dx 2017 w bone mets), long hx of treatments followed by Dr. Stephens November at [facility] (current tx capecitabine), hx liver/ bone/ malignant pleural effusion [facility] 10/07/2021 temporary pleural drain), Hx chronic diastolic CHF s/p ICD 0865 EF 10% in past, improved to 65% in 2022 , adrenal insufficiency, orthostatic hypotension , who was admitted on 11/20/2021 from home with a diagnosis of weakness, fatigue, no longer able to walk, AMS.     Current medical issues leading to Palliative Medicine involvement include: patient being treated for hypercalcemia of malignancy (corrected calcium 13.5 on admission) UTI and also acute LLE DVT extending into Inferior Vena Cava    CT Abd: 11/20/2021     1.  Large filling defect extending from the left femoral vein to the inferior    portion of the inferior vena cava concerning for deep venous thrombosis.    2.  Multiple hepatic masses are again noted compatible with hepatic metastatic    disease     3.  Diffuse skeletal metastases are again visualized    4.  Small volume of ascites.     ECHO  8/27 : EF 65-70%    Social: married to spouse Ariel Day for over 31 years.    They have 3 adult children (Dtr, Ariel Day, local, dtr Ariel Day, in [place] (family practice MD) and son, Ariel Day in [place]) 6 grandchildren in [place].     Recent move from [facility] [facility] to [place] to live near dtr. Currently in mobile home while inlaw suite under construction (ready this week they hope)    Patient and spouse have traveled extensively in the last 15 years, going to all 24 states in motor home. They have spent many winters in [place], [place].    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    Spouse notes that patient has had ongoing functional decline in past 6 months, needing more assistance with ADL care    Use LCD Guidelines and list features:      Cancer Diagnoses       ________  A. Disease with distant metastases at presentation OR         ___x_____  B. Progression from an earlier stage of disease to metastatic disease with either:                            ____x____  1. continued decline in spite of therapy                            ____x____  2. patient declines further disease directed therapy.    SPIRITUAL/Social/Emotional/psych:    Ariel Day contacted, agrees to serve as attending provider for hospice and provided verbal certification of terminal illness with prognosis of 6 months or less life expectancy. Orders for hospice admission, medications and plan of treatment received. Medication reconciliation completed.      Currently this patient has:    Supplemental O2: PRN    Foley Catheter: 16 Fr.        AICD: Has ICD been deactivated?  Yes:__x___   No:_____     (If no, please identify ex Corporate investment banker; [place] etc)       MEDS:    I have reviewed the patient's medication list with MD and identified the following:    Nonformulary medications: n/a    Unrelated medications: n/a      IDT communication to include MD, SN, SW, [place] and support team.

## 2021-12-07 NOTE — Hospice (Signed)
During the interdisciplinary group meeting on 12/07/2021, the primary care team reviewed the admission for Ariel Day, and bereavement was discussed.  It was confirmed that [name] is the primary bereaved at low risk level due to no significant risk factors; normal grief anticipated.

## 2021-12-07 NOTE — Hospice (Signed)
Patient and family understand current plan of care

## 2021-12-07 NOTE — Hospice (Signed)
Patient's family verbally understands and agrees with this LMSW plan of care.

## 2021-12-07 NOTE — Hospice (Signed)
LMSW arrived at the patient's home to complete an Initial Psychosocial Assessment visit for [facility]. The LMSW was greeted at the back door by the patient's DTR Ariel Day and was shown the side entrance to where the patient and Ariel Day reside. The RNCM was finishing up her visit upon arrival. The patient is a 76 y/o Caucasian female with a BSHPC Dx. Malignant neoplasm of female breast, unspecified estrogen receptor status, unspecified laterality, unspecified site of breast. Patient was received sleeping on her bed. Patient will wake for moments and fall back to sleep. Patient was alert and oriented x 2. Ariel Day was the POC. Ariel Day stated that the patient's appetite was poor. The patient had a good nights sleep Monday night. The patient's mood is accepting of her prognosis and Hospice services. open Ariel Day stated that he is coping one day at a time he is a little exhausted and needs a knee replacement but is waiting until the patient passes. Ariel Day stated that [name] and the two children are coping appropriately. 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 patient has been married to bill for 55 years. They have three children together. Ariel Day who lives in [place] is where the patient resides, Ariel Day lives in [place], and Ariel Day lives in [place]. The patient has one sister Ariel Day who is estranged and lives in [place]. The patient is a [employer] and belongs to the [place] [employer] [place]. When the patient passes there will be a ceremony up [place] and a ceremony dressing before the patient goes to [facility] which will be [facility]. LMSW provided emotional support court of counseling related to end of life care.  Problem: Caregiver's exhaustion/support.  Plan: LMSW will visit 1 X M, more if needed, available by phone, and coordinate care with the [facility].  Community resources: LMSW provided multiple HHA Agencies, multiple Facilities manager, distributors to purchase  multiple urns at [employer], and [place] information for the patient's husband.  DDNR: Yes  Funeral Home: [facility]

## 2021-12-07 NOTE — Hospice (Signed)
No spiritual care issues noted on admission, nor has the family or patient reached out for spiritual support.  Will contact family the week of 12/14/2021 to offer support and assess spiritual care needs.

## 2021-12-07 NOTE — Hospice (Signed)
Ariel Day  Age:76  Admit: 12/05/21  Hospice Dx: Breast Cancer  Assessment: PMH includes debility, DVT, plural effusion. Patient is lethargic, oriented to self and place , denies pain, shortness of breath and nausea. Patient's husband reports she had an emesis episode prior to discharge. Per family appetite is poor and patient has had significant physical decline over the last few months. Patient noted to have scattered bruising to bilateral arms. She has a foley draining amber urine with sediment. At visit, patiet was hypoxic with sats in the 80's. Oxygen applied at 2 lpm and patient recovered well.   Problem: patient is hypoxic on room air. Educated family on use of oxygen.  Plan for the next 2 weeks is to monitor for need for oxygen continuously

## 2021-12-07 NOTE — Hospice (Signed)
Collaboration of Hospice Plan of care with Medical Director and Hospice interdisciplinary care team and all in agreement with current POC; no changes to current POC.

## 2021-12-08 ENCOUNTER — Encounter: Payer: MEDICARE | Primary: Family Medicine

## 2021-12-08 ENCOUNTER — Ambulatory Visit: Payer: MEDICARE | Attending: Internal Medicine | Primary: Family Medicine

## 2021-12-08 NOTE — Hospice (Signed)
12-11-21 2:00:00 PM - [facility] staff called for getting SSN, said without Auth. not releasing pt. info ( HIPAA).    Staff agreed and told will contact pt. family.  ELECTRONICALLY SIGNED BY Mohanambal Dhanapal CODING

## 2021-12-08 NOTE — Hospice (Signed)
07-Jan-2022 8:55:00 AM - pt. daughter called, difficulty in breathing.   Warm transferred to [facility].  ELECTRONICALLY SIGNED BY Mohanambal Dhanapal CODING

## 2021-12-08 NOTE — Hospice (Signed)
Performed PRN snv rt reports of respiratory distress. Upon arrival, patient's spouse and daughter Colletta [place] present. Patient's breathing labored, lung sounds course, fluid building up in patient's mouth. Unable to successfully administer medication.  CNA arrived shortly after RN. This nurse and CNA moved patient on her side to hopefully drain fluid build-up. Copious amounts of black blood drained freely from nose and mouth. Patient's breathing slowed to approximately 4/minute until patient passed.

## 2021-12-08 NOTE — Hospice (Signed)
Patient passed while this nurse made a prn visit. TOD 11:20 following 1 minute auscultation with no AP or respirations.   Spouse and daughter present. Family grieving appropriately. [facility] to serve. medication disposal according to protocol. CNA and this nurse performed PM care. DME pickup called by triage for Friday pick up. This nurse notified PCP Dr. Carlye Grippe via phone call. Dr. Marlaine Hind notifed via email.

## 2021-12-13 ENCOUNTER — Encounter: Payer: MEDICARE | Primary: Family Medicine

## 2021-12-15 ENCOUNTER — Encounter: Payer: MEDICARE | Primary: Family Medicine

## 2021-12-20 ENCOUNTER — Encounter: Payer: MEDICARE | Primary: Family Medicine

## 2021-12-22 ENCOUNTER — Encounter: Payer: MEDICARE | Primary: Family Medicine

## 2021-12-25 DEATH — deceased

## 2021-12-27 ENCOUNTER — Encounter: Payer: MEDICARE | Primary: Family Medicine

## 2021-12-29 ENCOUNTER — Encounter: Payer: MEDICARE | Primary: Family Medicine

## 2022-01-03 ENCOUNTER — Encounter: Payer: MEDICARE | Primary: Family Medicine

## 2022-01-05 ENCOUNTER — Encounter: Payer: MEDICARE | Primary: Family Medicine
